# Patient Record
Sex: Female | Born: 1950
Health system: Southern US, Community
[De-identification: ages and names within clinical notes are randomized; demographics above are authoritative.]

## PROBLEM LIST (undated history)

## (undated) DIAGNOSIS — M1990 Unspecified osteoarthritis, unspecified site: Secondary | ICD-10-CM

## (undated) DIAGNOSIS — I10 Essential (primary) hypertension: Secondary | ICD-10-CM

## (undated) HISTORY — DX: Unspecified osteoarthritis, unspecified site: M19.90

## (undated) HISTORY — DX: Essential (primary) hypertension: I10

## (undated) HISTORY — PX: JOINT REPLACEMENT: SHX530

## (undated) HISTORY — PX: REPLACEMENT TOTAL KNEE: SUR1224

---

## 2007-12-20 ENCOUNTER — Emergency Department (HOSPITAL_COMMUNITY): Admission: EM | Admit: 2007-12-20 | Discharge: 2007-12-20 | Payer: Self-pay | Admitting: Emergency Medicine

## 2008-02-27 ENCOUNTER — Encounter: Admission: RE | Admit: 2008-02-27 | Discharge: 2008-03-26 | Payer: Self-pay | Admitting: Orthopedic Surgery

## 2008-08-16 ENCOUNTER — Emergency Department (HOSPITAL_COMMUNITY): Admission: EM | Admit: 2008-08-16 | Discharge: 2008-08-16 | Payer: Self-pay | Admitting: Emergency Medicine

## 2009-03-01 ENCOUNTER — Encounter: Admission: RE | Admit: 2009-03-01 | Discharge: 2009-03-01 | Payer: Self-pay | Admitting: Infectious Diseases

## 2009-10-28 ENCOUNTER — Encounter: Admission: RE | Admit: 2009-10-28 | Discharge: 2009-10-28 | Payer: Self-pay | Admitting: Internal Medicine

## 2010-10-04 ENCOUNTER — Other Ambulatory Visit: Payer: Self-pay | Admitting: Internal Medicine

## 2010-10-04 DIAGNOSIS — Z1231 Encounter for screening mammogram for malignant neoplasm of breast: Secondary | ICD-10-CM

## 2010-11-04 ENCOUNTER — Ambulatory Visit: Payer: Self-pay

## 2010-11-04 ENCOUNTER — Ambulatory Visit
Admission: RE | Admit: 2010-11-04 | Discharge: 2010-11-04 | Disposition: A | Discharge status: Home / Self Care | Payer: BC Managed Care – PPO | Source: Ambulatory Visit | Attending: Internal Medicine | Admitting: Internal Medicine

## 2010-11-04 DIAGNOSIS — Z1231 Encounter for screening mammogram for malignant neoplasm of breast: Secondary | ICD-10-CM

## 2011-08-30 DIAGNOSIS — M1710 Unilateral primary osteoarthritis, unspecified knee: Secondary | ICD-10-CM | POA: Insufficient documentation

## 2011-10-25 ENCOUNTER — Other Ambulatory Visit: Payer: Self-pay | Admitting: Internal Medicine

## 2011-10-25 DIAGNOSIS — Z1231 Encounter for screening mammogram for malignant neoplasm of breast: Secondary | ICD-10-CM

## 2011-10-30 ENCOUNTER — Other Ambulatory Visit: Payer: Self-pay | Admitting: Occupational Medicine

## 2011-10-30 ENCOUNTER — Ambulatory Visit: Payer: Self-pay

## 2011-10-30 DIAGNOSIS — R52 Pain, unspecified: Secondary | ICD-10-CM

## 2011-11-17 ENCOUNTER — Ambulatory Visit: Payer: BC Managed Care – PPO | Attending: Orthopedic Surgery | Admitting: Physical Therapy

## 2011-11-17 ENCOUNTER — Ambulatory Visit: Payer: BC Managed Care – PPO

## 2011-11-17 DIAGNOSIS — M25569 Pain in unspecified knee: Secondary | ICD-10-CM | POA: Insufficient documentation

## 2011-11-17 DIAGNOSIS — M25669 Stiffness of unspecified knee, not elsewhere classified: Secondary | ICD-10-CM | POA: Insufficient documentation

## 2011-11-17 DIAGNOSIS — R262 Difficulty in walking, not elsewhere classified: Secondary | ICD-10-CM | POA: Insufficient documentation

## 2011-11-17 DIAGNOSIS — IMO0001 Reserved for inherently not codable concepts without codable children: Secondary | ICD-10-CM | POA: Insufficient documentation

## 2011-11-20 ENCOUNTER — Ambulatory Visit: Payer: BC Managed Care – PPO | Admitting: Rehabilitation

## 2011-11-21 ENCOUNTER — Ambulatory Visit: Payer: BC Managed Care – PPO | Admitting: Physical Therapy

## 2011-11-23 ENCOUNTER — Ambulatory Visit: Payer: BC Managed Care – PPO | Admitting: Physical Therapy

## 2011-11-29 ENCOUNTER — Ambulatory Visit: Payer: BC Managed Care – PPO | Attending: Orthopedic Surgery

## 2011-11-29 DIAGNOSIS — M25569 Pain in unspecified knee: Secondary | ICD-10-CM | POA: Insufficient documentation

## 2011-11-29 DIAGNOSIS — M25669 Stiffness of unspecified knee, not elsewhere classified: Secondary | ICD-10-CM | POA: Insufficient documentation

## 2011-11-29 DIAGNOSIS — IMO0001 Reserved for inherently not codable concepts without codable children: Secondary | ICD-10-CM | POA: Insufficient documentation

## 2011-11-29 DIAGNOSIS — R262 Difficulty in walking, not elsewhere classified: Secondary | ICD-10-CM | POA: Insufficient documentation

## 2011-11-30 ENCOUNTER — Ambulatory Visit: Payer: BC Managed Care – PPO | Admitting: Physical Therapy

## 2011-12-04 DIAGNOSIS — Z96652 Presence of left artificial knee joint: Secondary | ICD-10-CM | POA: Insufficient documentation

## 2011-12-06 ENCOUNTER — Ambulatory Visit: Payer: BC Managed Care – PPO | Admitting: Physical Therapy

## 2011-12-07 ENCOUNTER — Ambulatory Visit: Payer: BC Managed Care – PPO | Admitting: Physical Therapy

## 2011-12-08 ENCOUNTER — Ambulatory Visit: Payer: BC Managed Care – PPO | Admitting: Rehabilitation

## 2011-12-11 ENCOUNTER — Encounter: Payer: BC Managed Care – PPO | Admitting: Physical Therapy

## 2011-12-12 ENCOUNTER — Ambulatory Visit: Payer: BC Managed Care – PPO | Admitting: Physical Therapy

## 2011-12-13 ENCOUNTER — Ambulatory Visit: Payer: BC Managed Care – PPO | Admitting: Physical Therapy

## 2011-12-15 ENCOUNTER — Ambulatory Visit: Payer: BC Managed Care – PPO

## 2011-12-18 ENCOUNTER — Ambulatory Visit: Payer: BC Managed Care – PPO | Admitting: Physical Therapy

## 2011-12-19 ENCOUNTER — Ambulatory Visit: Payer: BC Managed Care – PPO | Admitting: Physical Therapy

## 2011-12-22 ENCOUNTER — Ambulatory Visit: Payer: BC Managed Care – PPO | Admitting: Rehabilitation

## 2011-12-25 ENCOUNTER — Ambulatory Visit: Payer: BC Managed Care – PPO | Admitting: Physical Therapy

## 2011-12-28 ENCOUNTER — Ambulatory Visit: Payer: BC Managed Care – PPO | Attending: Orthopedic Surgery | Admitting: Physical Therapy

## 2011-12-28 DIAGNOSIS — M25569 Pain in unspecified knee: Secondary | ICD-10-CM | POA: Insufficient documentation

## 2011-12-28 DIAGNOSIS — IMO0001 Reserved for inherently not codable concepts without codable children: Secondary | ICD-10-CM | POA: Insufficient documentation

## 2011-12-28 DIAGNOSIS — M25669 Stiffness of unspecified knee, not elsewhere classified: Secondary | ICD-10-CM | POA: Insufficient documentation

## 2011-12-28 DIAGNOSIS — R262 Difficulty in walking, not elsewhere classified: Secondary | ICD-10-CM | POA: Insufficient documentation

## 2011-12-28 DIAGNOSIS — Z96659 Presence of unspecified artificial knee joint: Secondary | ICD-10-CM | POA: Insufficient documentation

## 2011-12-29 ENCOUNTER — Ambulatory Visit
Admission: RE | Admit: 2011-12-29 | Discharge: 2011-12-29 | Disposition: A | Discharge status: Home / Self Care | Payer: BC Managed Care – PPO | Source: Ambulatory Visit | Attending: Internal Medicine | Admitting: Internal Medicine

## 2011-12-29 DIAGNOSIS — Z1231 Encounter for screening mammogram for malignant neoplasm of breast: Secondary | ICD-10-CM

## 2012-01-01 ENCOUNTER — Encounter: Payer: BC Managed Care – PPO | Admitting: Physical Therapy

## 2012-01-02 ENCOUNTER — Ambulatory Visit: Payer: Self-pay | Admitting: Physical Therapy

## 2012-01-04 ENCOUNTER — Ambulatory Visit: Payer: BC Managed Care – PPO | Attending: Orthopedic Surgery | Admitting: Physical Therapy

## 2012-01-04 DIAGNOSIS — IMO0001 Reserved for inherently not codable concepts without codable children: Secondary | ICD-10-CM | POA: Insufficient documentation

## 2012-01-04 DIAGNOSIS — M25669 Stiffness of unspecified knee, not elsewhere classified: Secondary | ICD-10-CM | POA: Insufficient documentation

## 2012-01-04 DIAGNOSIS — R262 Difficulty in walking, not elsewhere classified: Secondary | ICD-10-CM | POA: Insufficient documentation

## 2012-01-04 DIAGNOSIS — M25569 Pain in unspecified knee: Secondary | ICD-10-CM | POA: Insufficient documentation

## 2012-01-09 ENCOUNTER — Ambulatory Visit: Payer: BC Managed Care – PPO | Admitting: Rehabilitation

## 2012-01-11 ENCOUNTER — Ambulatory Visit: Payer: BC Managed Care – PPO | Admitting: Rehabilitation

## 2012-01-16 ENCOUNTER — Encounter: Payer: Self-pay | Admitting: Physical Therapy

## 2012-01-17 ENCOUNTER — Ambulatory Visit: Payer: BC Managed Care – PPO | Admitting: Physical Therapy

## 2012-01-18 ENCOUNTER — Ambulatory Visit: Payer: BC Managed Care – PPO | Admitting: Physical Therapy

## 2012-01-23 ENCOUNTER — Ambulatory Visit: Payer: BC Managed Care – PPO | Admitting: Physical Therapy

## 2012-01-25 ENCOUNTER — Ambulatory Visit: Payer: BC Managed Care – PPO | Admitting: Physical Therapy

## 2012-01-29 ENCOUNTER — Ambulatory Visit: Payer: BC Managed Care – PPO | Attending: Orthopedic Surgery | Admitting: Physical Therapy

## 2012-01-29 DIAGNOSIS — M25669 Stiffness of unspecified knee, not elsewhere classified: Secondary | ICD-10-CM | POA: Insufficient documentation

## 2012-01-29 DIAGNOSIS — IMO0001 Reserved for inherently not codable concepts without codable children: Secondary | ICD-10-CM | POA: Insufficient documentation

## 2012-01-29 DIAGNOSIS — M25569 Pain in unspecified knee: Secondary | ICD-10-CM | POA: Insufficient documentation

## 2012-01-29 DIAGNOSIS — R262 Difficulty in walking, not elsewhere classified: Secondary | ICD-10-CM | POA: Insufficient documentation

## 2012-01-31 ENCOUNTER — Ambulatory Visit: Payer: BC Managed Care – PPO | Admitting: Physical Therapy

## 2012-02-05 ENCOUNTER — Ambulatory Visit: Payer: BC Managed Care – PPO | Admitting: Physical Therapy

## 2012-02-06 ENCOUNTER — Encounter: Payer: BC Managed Care – PPO | Admitting: Rehabilitation

## 2012-02-09 ENCOUNTER — Encounter: Payer: Self-pay | Admitting: Physical Therapy

## 2012-02-13 ENCOUNTER — Ambulatory Visit: Payer: BC Managed Care – PPO | Admitting: Physical Therapy

## 2012-02-15 ENCOUNTER — Encounter: Payer: Self-pay | Admitting: Physical Therapy

## 2012-02-20 ENCOUNTER — Ambulatory Visit: Payer: BC Managed Care – PPO | Admitting: Physical Therapy

## 2012-02-21 ENCOUNTER — Encounter: Payer: Self-pay | Admitting: Physical Therapy

## 2012-02-21 ENCOUNTER — Ambulatory Visit: Payer: BC Managed Care – PPO | Admitting: Physical Therapy

## 2012-02-27 ENCOUNTER — Ambulatory Visit: Payer: BC Managed Care – PPO | Attending: Orthopedic Surgery | Admitting: Physical Therapy

## 2012-02-27 DIAGNOSIS — M25669 Stiffness of unspecified knee, not elsewhere classified: Secondary | ICD-10-CM | POA: Insufficient documentation

## 2012-02-27 DIAGNOSIS — M25569 Pain in unspecified knee: Secondary | ICD-10-CM | POA: Insufficient documentation

## 2012-02-27 DIAGNOSIS — IMO0001 Reserved for inherently not codable concepts without codable children: Secondary | ICD-10-CM | POA: Insufficient documentation

## 2012-02-27 DIAGNOSIS — R262 Difficulty in walking, not elsewhere classified: Secondary | ICD-10-CM | POA: Insufficient documentation

## 2012-02-29 ENCOUNTER — Encounter: Payer: Self-pay | Admitting: Rehabilitation

## 2012-03-05 ENCOUNTER — Encounter: Payer: Self-pay | Admitting: Physical Therapy

## 2013-01-16 ENCOUNTER — Other Ambulatory Visit: Payer: Self-pay

## 2013-01-16 DIAGNOSIS — Z1231 Encounter for screening mammogram for malignant neoplasm of breast: Secondary | ICD-10-CM

## 2013-02-13 ENCOUNTER — Ambulatory Visit
Admission: RE | Admit: 2013-02-13 | Discharge: 2013-02-13 | Disposition: A | Discharge status: Home / Self Care | Payer: BC Managed Care – PPO | Source: Ambulatory Visit

## 2013-02-13 DIAGNOSIS — Z1231 Encounter for screening mammogram for malignant neoplasm of breast: Secondary | ICD-10-CM

## 2014-01-27 ENCOUNTER — Other Ambulatory Visit: Payer: Self-pay

## 2014-01-27 DIAGNOSIS — Z1231 Encounter for screening mammogram for malignant neoplasm of breast: Secondary | ICD-10-CM

## 2014-02-24 ENCOUNTER — Ambulatory Visit
Admission: RE | Admit: 2014-02-24 | Discharge: 2014-02-24 | Disposition: A | Discharge status: Home / Self Care | Payer: BC Managed Care – PPO | Source: Ambulatory Visit

## 2014-02-24 DIAGNOSIS — Z1231 Encounter for screening mammogram for malignant neoplasm of breast: Secondary | ICD-10-CM

## 2014-12-07 ENCOUNTER — Ambulatory Visit (INDEPENDENT_AMBULATORY_CARE_PROVIDER_SITE_OTHER): Payer: BLUE CROSS/BLUE SHIELD | Admitting: Family Medicine

## 2014-12-07 VITALS — BP 124/80 | HR 87 | Temp 98.3°F | Resp 18 | Ht <= 58 in | Wt 134.1 lb

## 2014-12-07 DIAGNOSIS — J0100 Acute maxillary sinusitis, unspecified: Secondary | ICD-10-CM | POA: Diagnosis not present

## 2014-12-07 DIAGNOSIS — J029 Acute pharyngitis, unspecified: Secondary | ICD-10-CM | POA: Diagnosis not present

## 2014-12-07 MED ORDER — AMOXICILLIN 500 MG PO CAPS: 500.0000 mg | ORAL_CAPSULE | Freq: Two times a day (BID) | ORAL | Status: DC

## 2014-12-07 NOTE — Patient Instructions (Signed)
Please let us know if you're not improving after the next 2-3 days.

## 2014-12-07 NOTE — Progress Notes (Signed)
This chart was scribed for Robyn Haber, MD by Moises Blood, medical scribe at [facility].The patient was seen in exam room 12 and the patient's care was started at 8:17 PM.  Patient ID: April Randall MRN: [id number], DOB: June 12, 1950, 64 y.o. Date of Encounter: 12/07/2014  Primary Physician: No primary care provider on file.  Chief Complaint:  Chief Complaint  Patient presents with   Headache    x 3 days   Sore Throat    x 3 days   Chills    happens around 4pm daily    HPI:  April Randall is a 64 y.o. female who presents to [facility] complaining of headache with associated chills and sore throat for the past 3 days. She would feel cold and have to get covers to stay warm. She feels feverish at night with some coughs. She also has some lost taste from food, and hurts to even swallow saliva. She denies nausea, abdominal pain, UTI symptoms, sneezing.   She has grandchildren at home.   Past Medical History  Diagnosis Date   Arthritis    Hypertension      Home Meds: Prior to Admission medications   Medication Sig Start Date End Date Taking? Authorizing Provider  amLODipine (NORVASC) 10 MG tablet Take 10 mg by mouth daily.   Yes Historical Provider, MD  hydrochlorothiazide (HYDRODIURIL) 25 MG tablet Take 25 mg by mouth daily.   Yes Historical Provider, MD  lisinopril (PRINIVIL,ZESTRIL) 20 MG tablet Take 20 mg by mouth daily.   Yes Historical Provider, MD    Allergies: No Known Allergies  Social History   Social History   Marital Status: Married    Spouse Name: N/A   Number of Children: N/A   Years of Education: N/A   Occupational History   Not on file.   Social History Main Topics   Smoking status: Never Smoker    Smokeless tobacco: Not on file   Alcohol Use: No   Drug Use: No   Sexual Activity: Not on file   Other Topics Concern   Not on file   Social History Narrative   No narrative on file      Review of Systems: Constitutional: negative for weight changes; positive for chills, fever, night sweats, fatigue HEENT: negative for vision changes, hearing loss, congestion, rhinorrhea, ST, epistaxis, or sinus pressure Cardiovascular: negative for chest pain or palpitations Respiratory: negative for hemoptysis, wheezing, shortness of breath; positive for cough Abdominal: negative for abdominal pain, nausea, vomiting, diarrhea, or constipation Dermatological: negative for rash Neurologic: negative for dizziness, or syncope; positive for headache GU: negative for UTI symptoms All other systems reviewed and are otherwise negative with the exception to those above and in the HPI.  Physical Exam: Blood pressure 124/80, pulse 87, temperature 98.3 F (36.8 C), temperature source Oral, resp. rate 18, height 4' 9.25" (1.454 m), weight 134 lb 2 oz (60.839 kg), SpO2 98 %., Body mass index is 28.78 kg/(m^2). General: Well developed, well nourished, in no acute distress. Head: Normocephalic, atraumatic, eyes without discharge, sclera non-icteric, nares are without discharge. Bilateral auditory canals clear, TM's are without perforation, pearly grey and translucent with reflective cone of light bilaterally. Oral cavity moist, posterior pharynx without exudate, but with some erythema and post nasal drip. Both nasal passages are swollen with exudates Neck: Supple. No thyromegaly. Full ROM. No lymphadenopathy. Lungs: Clear bilaterally to auscultation without wheezes, rales, or rhonchi. Breathing is unlabored. Heart: RRR with  S1 S2. No murmurs, rubs, or gallops appreciated. Abdomen: Soft, non-tender, non-distended with normoactive bowel sounds. No hepatomegaly. No rebound/guarding. No obvious abdominal masses. Msk:  Strength and tone normal for age. Extremities/Skin: Warm and dry. No clubbing or cyanosis. No edema. No rashes or suspicious lesions. Neuro: Alert and oriented X 3. Moves all extremities  spontaneously. Gait is normal. CNII-XII grossly in tact. Psych:  Responds to questions appropriately with a normal affect.   Labs:  ASSESSMENT AND PLAN:  64 y.o. year old female with  This chart was scribed in my presence and reviewed by me personally.    ICD-9-CM ICD-10-CM   1. Acute maxillary sinusitis, recurrence not specified 461.0 J01.00 amoxicillin (AMOXIL) 500 MG capsule  2. Acute pharyngitis, unspecified pharyngitis type 462 J02.9 amoxicillin (AMOXIL) 500 MG capsule     Signed, Robyn Haber, MD    Signed, Robyn Haber, MD 12/07/2014 8:17 PM

## 2015-01-20 ENCOUNTER — Other Ambulatory Visit: Payer: Self-pay

## 2015-01-20 DIAGNOSIS — Z1231 Encounter for screening mammogram for malignant neoplasm of breast: Secondary | ICD-10-CM

## 2015-01-30 ENCOUNTER — Other Ambulatory Visit: Payer: Self-pay | Admitting: Internal Medicine

## 2015-01-30 DIAGNOSIS — E2839 Other primary ovarian failure: Secondary | ICD-10-CM

## 2015-02-26 ENCOUNTER — Ambulatory Visit
Admission: RE | Admit: 2015-02-26 | Discharge: 2015-02-26 | Disposition: A | Discharge status: Home / Self Care | Payer: BLUE CROSS/BLUE SHIELD | Source: Ambulatory Visit

## 2015-02-26 DIAGNOSIS — Z1231 Encounter for screening mammogram for malignant neoplasm of breast: Secondary | ICD-10-CM

## 2015-03-26 ENCOUNTER — Observation Stay (HOSPITAL_COMMUNITY)
Admission: EM | Admit: 2015-03-26 | Discharge: 2015-03-27 | Disposition: A | Discharge status: Home / Self Care | Payer: BLUE CROSS/BLUE SHIELD | Attending: Internal Medicine | Admitting: Internal Medicine

## 2015-03-26 ENCOUNTER — Emergency Department (HOSPITAL_COMMUNITY): Payer: BLUE CROSS/BLUE SHIELD

## 2015-03-26 ENCOUNTER — Encounter (HOSPITAL_COMMUNITY): Payer: Self-pay | Admitting: Emergency Medicine

## 2015-03-26 ENCOUNTER — Observation Stay (HOSPITAL_COMMUNITY): Payer: BLUE CROSS/BLUE SHIELD

## 2015-03-26 DIAGNOSIS — E785 Hyperlipidemia, unspecified: Secondary | ICD-10-CM | POA: Diagnosis not present

## 2015-03-26 DIAGNOSIS — I10 Essential (primary) hypertension: Secondary | ICD-10-CM | POA: Diagnosis not present

## 2015-03-26 DIAGNOSIS — E876 Hypokalemia: Secondary | ICD-10-CM | POA: Diagnosis not present

## 2015-03-26 DIAGNOSIS — G454 Transient global amnesia: Secondary | ICD-10-CM | POA: Diagnosis not present

## 2015-03-26 DIAGNOSIS — Z79899 Other long term (current) drug therapy: Secondary | ICD-10-CM | POA: Diagnosis not present

## 2015-03-26 DIAGNOSIS — M1990 Unspecified osteoarthritis, unspecified site: Secondary | ICD-10-CM | POA: Diagnosis present

## 2015-03-26 DIAGNOSIS — G9340 Encephalopathy, unspecified: Secondary | ICD-10-CM | POA: Diagnosis present

## 2015-03-26 LAB — I-STAT VENOUS BLOOD GAS, ED
Acid-Base Excess: 1 mmol/L (ref 0.0–2.0)
Bicarbonate: 27.1 mEq/L — ABNORMAL HIGH (ref 20.0–24.0)
O2 Saturation: 79 %
PH VEN: 7.382 — AB (ref 7.250–7.300)
TCO2: 28 mmol/L (ref 0–100)
pCO2, Ven: 45.6 mmHg (ref 45.0–50.0)
pO2, Ven: 44 mmHg (ref 30.0–45.0)

## 2015-03-26 LAB — CBC
HEMATOCRIT: 42.1 % (ref 36.0–46.0)
Hemoglobin: 14.1 g/dL (ref 12.0–15.0)
MCH: 30.1 pg (ref 26.0–34.0)
MCHC: 33.5 g/dL (ref 30.0–36.0)
MCV: 89.8 fL (ref 78.0–100.0)
Platelets: 204 10*3/uL (ref 150–400)
RBC: 4.69 MIL/uL (ref 3.87–5.11)
RDW: 12.7 % (ref 11.5–15.5)
WBC: 4.8 10*3/uL (ref 4.0–10.5)

## 2015-03-26 LAB — URINALYSIS, ROUTINE W REFLEX MICROSCOPIC
BILIRUBIN URINE: NEGATIVE
Glucose, UA: NEGATIVE mg/dL
Hgb urine dipstick: NEGATIVE
Ketones, ur: 15 mg/dL — AB
Leukocytes, UA: NEGATIVE
NITRITE: NEGATIVE
PH: 7 (ref 5.0–8.0)
Protein, ur: NEGATIVE mg/dL
SPECIFIC GRAVITY, URINE: 1.009 (ref 1.005–1.030)

## 2015-03-26 LAB — DIFFERENTIAL
BASOS ABS: 0 10*3/uL (ref 0.0–0.1)
Basophils Relative: 0 %
EOS PCT: 1 %
Eosinophils Absolute: 0 10*3/uL (ref 0.0–0.7)
LYMPHS ABS: 1.2 10*3/uL (ref 0.7–4.0)
LYMPHS PCT: 26 %
MONO ABS: 0.3 10*3/uL (ref 0.1–1.0)
Monocytes Relative: 5 %
NEUTROS ABS: 3.2 10*3/uL (ref 1.7–7.7)
NEUTROS PCT: 68 %

## 2015-03-26 LAB — I-STAT CHEM 8, ED
BUN: 18 mg/dL (ref 6–20)
CALCIUM ION: 1.4 mmol/L — AB (ref 1.13–1.30)
CHLORIDE: 99 mmol/L — AB (ref 101–111)
Creatinine, Ser: 0.9 mg/dL (ref 0.44–1.00)
GLUCOSE: 122 mg/dL — AB (ref 65–99)
HCT: 46 % (ref 36.0–46.0)
HEMOGLOBIN: 15.6 g/dL — AB (ref 12.0–15.0)
Potassium: 2.9 mmol/L — ABNORMAL LOW (ref 3.5–5.1)
Sodium: 139 mmol/L (ref 135–145)
TCO2: 27 mmol/L (ref 0–100)

## 2015-03-26 LAB — COMPREHENSIVE METABOLIC PANEL
ALK PHOS: 90 U/L (ref 38–126)
ALT: 21 U/L (ref 14–54)
ANION GAP: 10 (ref 5–15)
AST: 28 U/L (ref 15–41)
Albumin: 4.1 g/dL (ref 3.5–5.0)
BILIRUBIN TOTAL: 0.8 mg/dL (ref 0.3–1.2)
BUN: 13 mg/dL (ref 6–20)
CALCIUM: 11.2 mg/dL — AB (ref 8.9–10.3)
CO2: 26 mmol/L (ref 22–32)
Chloride: 102 mmol/L (ref 101–111)
Creatinine, Ser: 0.87 mg/dL (ref 0.44–1.00)
GFR calc non Af Amer: >60 mL/min (ref >60)
Glucose, Bld: 127 mg/dL — ABNORMAL HIGH (ref 65–99)
Potassium: 3 mmol/L — ABNORMAL LOW (ref 3.5–5.1)
Sodium: 138 mmol/L (ref 135–145)
TOTAL PROTEIN: 7.2 g/dL (ref 6.5–8.1)

## 2015-03-26 LAB — CBG MONITORING, ED: GLUCOSE-CAPILLARY: 128 mg/dL — AB (ref 65–99)

## 2015-03-26 LAB — RAPID URINE DRUG SCREEN, HOSP PERFORMED
AMPHETAMINES: NOT DETECTED
BENZODIAZEPINES: NOT DETECTED
Barbiturates: NOT DETECTED
Cocaine: NOT DETECTED
OPIATES: NOT DETECTED
Tetrahydrocannabinol: NOT DETECTED

## 2015-03-26 LAB — I-STAT TROPONIN, ED: Troponin i, poc: 0.02 ng/mL (ref 0.00–0.08)

## 2015-03-26 LAB — PROTIME-INR
INR: 1.05 (ref 0.00–1.49)
Prothrombin Time: 13.9 seconds (ref 11.6–15.2)

## 2015-03-26 LAB — APTT: APTT: 29 s (ref 24–37)

## 2015-03-26 MED ORDER — ASPIRIN 325 MG PO TABS
325.0000 mg | ORAL_TABLET | Freq: Every day | ORAL | Status: DC
  Administered 2015-03-27: 325 mg via ORAL
  Filled 2015-03-26: qty 1

## 2015-03-26 MED ORDER — POTASSIUM CHLORIDE CRYS ER 20 MEQ PO TBCR
40.0000 meq | EXTENDED_RELEASE_TABLET | Freq: Once | ORAL | Status: AC
  Administered 2015-03-26: 40 meq via ORAL
  Filled 2015-03-26: qty 2

## 2015-03-26 MED ORDER — HYDRALAZINE HCL 20 MG/ML IJ SOLN: 5.0000 mg | INTRAMUSCULAR | Status: DC | PRN

## 2015-03-26 MED ORDER — AMLODIPINE BESYLATE 10 MG PO TABS
10.0000 mg | ORAL_TABLET | Freq: Every day | ORAL | Status: DC
  Administered 2015-03-27: 10 mg via ORAL
  Filled 2015-03-26: qty 1

## 2015-03-26 MED ORDER — STROKE: EARLY STAGES OF RECOVERY BOOK
Freq: Once | Status: AC
Start: 2015-03-26 — End: 2015-03-27
  Administered 2015-03-27: 01:00:00
  Filled 2015-03-26: qty 1

## 2015-03-26 MED ORDER — ASPIRIN 300 MG RE SUPP: 300.0000 mg | Freq: Every day | RECTAL | Status: DC

## 2015-03-26 MED ORDER — ENOXAPARIN SODIUM 40 MG/0.4ML ~~LOC~~ SOLN: 40.0000 mg | SUBCUTANEOUS | Status: DC

## 2015-03-26 MED ORDER — SENNOSIDES-DOCUSATE SODIUM 8.6-50 MG PO TABS: 1.0000 | ORAL_TABLET | Freq: Every evening | ORAL | Status: DC | PRN

## 2015-03-26 MED ORDER — LISINOPRIL 20 MG PO TABS
20.0000 mg | ORAL_TABLET | Freq: Every day | ORAL | Status: DC
  Administered 2015-03-27: 20 mg via ORAL
  Filled 2015-03-26: qty 1

## 2015-03-26 NOTE — ED Notes (Signed)
The pt lost her memory one hour ago  She was at work and her daughter was called  She knows her name but cannot answer any other questions except she thinks she is in a hospital

## 2015-03-26 NOTE — Progress Notes (Signed)
Received report from E.D  Was informed patient at MRI at this time.

## 2015-03-26 NOTE — ED Provider Notes (Signed)
CSN: [id number]     Arrival date & time 03/26/15  2128 History   First MD Initiated Contact with Patient 03/26/15 2153     Chief Complaint  Patient presents with  . Cerebrovascular Accident    An emergency department physician performed an initial assessment on this suspected stroke patient at 2141. (Consider location/radiation/quality/duration/timing/severity/associated sxs/prior Treatment) HPI Comments: April Randall is a 64 y.o. female who was at work earlier when she began developing difficulty with her memory. She was transported to the emergency department [place] was called. She has difficulty telling us the month, where she is, or what is going on. She is not sure what she was doing at work, but does remember being at work.  Besides her memory problems, she denies any numbness, tingling, chest pain, headache, weakness. No seizure hx.  ROS 10 Systems reviewed and are negative for acute change except as noted in the HPI.   The history is provided by the patient.    Past Medical History  Diagnosis Date  . Arthritis   . Hypertension    Past Surgical History  Procedure Laterality Date  . Replacement total knee Right    History reviewed. No pertinent family history. Social History  Substance Use Topics  . Smoking status: Never Smoker   . Smokeless tobacco: None  . Alcohol Use: No   OB History    No data available     Review of Systems    Allergies  Review of patient's allergies indicates no known allergies.  Home Medications   Prior to Admission medications   Medication Sig Start Date End Date Taking? Authorizing Provider  amLODipine (NORVASC) 10 MG tablet Take 10 mg by mouth daily.   Yes Historical Provider, MD  hydrochlorothiazide (HYDRODIURIL) 25 MG tablet Take 25 mg by mouth daily.   Yes Historical Provider, MD  lisinopril (PRINIVIL,ZESTRIL) 20 MG tablet Take 20 mg by mouth daily.   Yes Historical Provider, MD  amoxicillin (AMOXIL) 500 MG capsule Take  1 capsule (500 mg total) by mouth 2 (two) times daily. Patient not taking: Reported on 03/26/2015 12/07/14   Robyn Haber, MD   BP 118/85 mmHg  Pulse 85  Temp(Src) 98 F (36.7 C) (Oral)  Resp 17  Ht 4\' 9"  (1.448 m)  Wt 136 lb 14.5 oz (62.1 kg)  BMI 29.62 kg/m2  SpO2 99% Physical Exam  Constitutional: She appears well-developed.  HENT:  Head: Normocephalic and atraumatic.  Eyes: EOM are normal.  Neck: Normal range of motion. Neck supple.  Cardiovascular: Normal rate.   Pulmonary/Chest: Effort normal.  Abdominal: Bowel sounds are normal.  Neurological: She is alert. No cranial nerve deficit. Coordination normal.  Skin: Skin is warm and dry.  Nursing note and vitals reviewed.   ED Course  Procedures (including critical care time) Labs Review Labs Reviewed  CBG MONITORING, ED - Abnormal; Notable for the following:    Glucose-Capillary 128 (*)    All other components within normal limits  I-STAT CHEM 8, ED - Abnormal; Notable for the following:    Potassium 2.9 (*)    Chloride 99 (*)    Glucose, Bld 122 (*)    Calcium, Ion 1.40 (*)    Hemoglobin 15.6 (*)    All other components within normal limits  I-STAT VENOUS BLOOD GAS, ED - Abnormal; Notable for the following:    pH, Ven 7.382 (*)    Bicarbonate 27.1 (*)    All other components within normal limits  PROTIME-INR  APTT  CBC  DIFFERENTIAL  COMPREHENSIVE METABOLIC PANEL  I-STAT TROPOININ, ED    Imaging Review Ct Head Wo Contrast  03/26/2015  CLINICAL DATA:  Code stroke.  Memory loss.  Initial encounter. EXAM: CT HEAD WITHOUT CONTRAST TECHNIQUE: Contiguous axial images were obtained from the base of the skull through the vertex without intravenous contrast. COMPARISON:  None. FINDINGS: There is no evidence of acute infarction, mass lesion, or intra- or extra-axial hemorrhage on CT. Scattered chronic lacunar infarcts are seen at the basal ganglia bilaterally. Mild periventricular white matter change likely reflects  small vessel ischemic microangiopathy. Slightly decreased attenuation at the inferior temporal lobes is thought to be artifactual in nature. The posterior fossa, including the cerebellum, brainstem and fourth ventricle, is within normal limits. The third and lateral ventricles are unremarkable in appearance. The cerebral hemispheres demonstrate grossly normal gray-white differentiation. No mass effect or midline shift is seen. There is no evidence of fracture; visualized osseous structures are unremarkable in appearance. The visualized portions of the orbits are within normal limits. There is partial opacification of the left maxillary sinus. The remaining paranasal sinuses and mastoid air cells are well-aerated. No significant soft tissue abnormalities are seen. IMPRESSION: 1. No acute intracranial pathology seen on CT. 2. Scattered chronic lacunar infarcts at the basal ganglia bilaterally. 3. Mild small vessel ischemic microangiopathy. 4. Partial opacification of the left maxillary sinus. These results were called by telephone at the time of interpretation on 03/26/2015 at 9:53 pm to Dr. Leonel Ramsay, who verbally acknowledged these results. Electronically Signed   By: Garald Balding M.D.   On: 03/26/2015 21:55   I have personally reviewed and evaluated these images and lab results as part of my medical decision-making.   EKG Interpretation None      MDM   Final diagnoses:  Transient global amnesia    Pt comes in with cc of confusion. She has no focal neuro deficits. It appears that pt is having global amnesia. She is still symptomatic, and so we will admit. Neuro had come in to see pt immediately, as pt was code stroke - and they would appreciated EEG and MRI.    Varney Biles, MD 03/27/15 [phone]

## 2015-03-26 NOTE — Consult Note (Signed)
Neurology Consultation Reason for Consult: Memory loss Referring Physician: Mora Bellman  CC: Memory loss  History is obtained from: Patient, daughter  HPI: April Randall is a 64 y.o. female who was at work earlier when she began developing difficulty with her memory. She was transported to the emergency department workup stroke was called. She has difficulty telling us the month, where she is, or what is going on. She is not sure what she was doing at work, but does remember being at work. She knows who her daughter is. She initially states that she does not know what her name is. Besides her memory problems, she has no other symptoms including headache, clouded sensorium, fevers, chills, or other problems. She still is having some difficulty with her memory at this time.  Of note, her daughter reports that there is a similar episode approximately one year ago.   LKW: Unclear tpa given?: no, mild symptoms, not clearly stroke    ROS: A 14 point ROS was performed and is negative except as noted in the HPI.   Past Medical History  Diagnosis Date  . Arthritis   . Hypertension      Family History  Problem Relation Age of Onset  . Hypertension Mother      Social History:  reports that she has never smoked. She does not have any smokeless tobacco history on file. She reports that she does not drink alcohol or use illicit drugs.   Exam: Current vital signs: BP 142/92 mmHg  Pulse 82  Temp(Src) 98 F (36.7 C) (Oral)  Resp 20  Ht 4\' 9"  (1.448 m)  Wt 62.1 kg (136 lb 14.5 oz)  BMI 29.62 kg/m2  SpO2 100% Vital signs in last 24 hours: Temp:  [98 F (36.7 C)] 98 F (36.7 C) (12/30 2133) Pulse Rate:  [82-91] 82 (12/30 2230) Resp:  [16-20] 20 (12/30 2230) BP: (118-158)/(85-92) 142/92 mmHg (12/30 2230) SpO2:  [97 %-100 %] 100 % (12/30 2230) Weight:  [62.1 kg (136 lb 14.5 oz)] 62.1 kg (136 lb 14.5 oz) (12/30 2149)   Physical Exam  Constitutional: Appears well-developed and  well-nourished.  Psych: Affect appropriate to situation Eyes: No scleral injection HENT: No OP obstrucion Head: Normocephalic.  Cardiovascular: Normal rate and regular rhythm.  Respiratory: Effort normal and breath sounds normal to anterior ascultation GI: Soft.  No distension. There is no tenderness.  Skin: WDI  Neuro: Mental Status: Patient is awake, alert, unable to give month or location. She has the number of quarters in $2.75 as 7. She is able to easily add 5+7. She is able to repeat without difficulty. Cranial Nerves: II: Visual Fields are full. Pupils are equal, round, and reactive to light.   III,IV, VI: EOMI without ptosis or diploplia.  V: Facial sensation is symmetric to temperature VII: Facial movement is symmetric.  VIII: hearing is intact to voice X: Uvula elevates symmetrically XI: Shoulder shrug is symmetric. XII: tongue is midline without atrophy or fasciculations.  Motor: Tone is normal. Bulk is normal. 5/5 strength was present in all four extremities.  Sensory: Sensation is symmetric to light touch and temperature in the arms and legs. Cerebellar: FNF and HKS are intact bilaterally         I have reviewed labs in epic and the results pertinent to this consultation are: Mildly Elevated calcium  I have reviewed the images obtained: CT head-negative  Impression: 64 year old female with memory difficulties. It is not clear that she had any other symptoms at  any point during this episode. Her presentation is not classical for transient global amnesia, but he thinks that this is one consideration. It is rarely recurrent, but can happen. Seizure would also be something that would need to be considered, though I don't have any clear evidence that this is what caused this episode. Psychogenic fugue would be diagnosis of exclusion. Given that she is not back to baseline, I would favor admission for observation and getting an MRI/EEG.  Recommendations: 1) MRI 2)  EEG 3) neurology will follow   Roland Rack, MD [facility] [phone]  If 7pm- 7am, please page neurology on call as listed in [place].

## 2015-03-26 NOTE — Code Documentation (Signed)
April Randall is a 64yo bf presenting to the [facility] for acute onset confusion while at work.  She was brought to the ED by her daughter.  She was at work when she became confused and her employer called her daughter to come get her.  The pt remembers being at work but is unable to recall the details of what she was doing or the details of her day.  She is able to recognize her daughter and knows her own name but cannot recall the month, year, or how old she is.  NIH 2 for questions incorrect otherwise exam in nonfocal.

## 2015-03-26 NOTE — H&P (Addendum)
[facility] History and Physical  April Randall [id number] DOB: 1950/09/28 DOA: 03/26/2015  Referring physician: ED physician PCP: No primary care provider on file.  Specialists:   Chief Complaint: Confusion and loss of memory  HPI: April Randall is a 64 y.o. female with PMH of hypertension, arthritis, who presents with confusion and loss of memory.  Per report, pt became confused at work at about 8:00PM. She is able to recognize her daughter and knows her own name, but cannot recall the month, year, or how old she is.She did not pass out. The episode lasted for about 20 minutes. She denies unilateral weakness, chest pain, palpitation, shortness of breath. No abdominal pain, diarrhea, symptoms of UTI, rashes, fever or chills. Currently she looks at her normal health condition per her daughter. No complaints. Daughter states that the patient had a similar episode last year. She had negative CT scan of her head per her daughter.  In ED, patient was found to have negative CT head, INR 1.05, PTT 29, WBC 4.8, temperature normal, no tachycardia, potassium 2.9, renal function normal. Patient's admitted to inpatient for further evaluation and treatment. Neurology was consulted.  Where does patient live?   At home  Can patient participate in ADLs?  Yes  Review of Systems:   General: no fevers, chills, no changes in body weight, has fatigue HEENT: no blurry vision, hearing changes or sore throat Pulm: no dyspnea, coughing, wheezing CV: no chest pain, palpitations Abd: no nausea, vomiting, abdominal pain, diarrhea, constipation GU: no dysuria, burning on urination, increased urinary frequency, hematuria  Ext: no leg edema Neuro: no unilateral weakness, numbness, or tingling, no vision change or hearing loss. Had confusion and loss of memory. Skin: no rash MSK: No muscle spasm, no deformity, no limitation of range of movement in spin Heme: No easy bruising.  Travel history: No recent  long distant travel.  Allergy: No Known Allergies  Past Medical History  Diagnosis Date  . Arthritis   . Hypertension     Past Surgical History  Procedure Laterality Date  . Replacement total knee Right     Social History:  reports that she has never smoked. She does not have any smokeless tobacco history on file. She reports that she does not drink alcohol or use illicit drugs.  Family History:  Family History  Problem Relation Age of Onset  . Hypertension Mother      Prior to Admission medications   Medication Sig Start Date End Date Taking? Authorizing Provider  amLODipine (NORVASC) 10 MG tablet Take 10 mg by mouth daily.   Yes Historical Provider, MD  hydrochlorothiazide (HYDRODIURIL) 25 MG tablet Take 25 mg by mouth daily.   Yes Historical Provider, MD  lisinopril (PRINIVIL,ZESTRIL) 20 MG tablet Take 20 mg by mouth daily.   Yes Historical Provider, MD  amoxicillin (AMOXIL) 500 MG capsule Take 1 capsule (500 mg total) by mouth 2 (two) times daily. Patient not taking: Reported on 03/26/2015 12/07/14   Robyn Haber, MD    Physical Exam: Filed Vitals:   03/26/15 2133 03/26/15 2149 03/26/15 2200  BP: 158/89  118/85  Pulse: 91  85  Temp: 98 F (36.7 C)    TempSrc: Oral    Resp: 16  17  Height:  4\' 9"  (1.448 m)   Weight:  62.1 kg (136 lb 14.5 oz)   SpO2: 97%  99%   General: Not in acute distress HEENT:       Eyes: PERRL, EOMI, no scleral icterus.  ENT: No discharge from the ears and nose, no pharynx injection, no tonsillar enlargement.        Neck: No JVD, no bruit, no mass felt. Heme: No neck lymph node enlargement. Cardiac: S1/S2, RRR, No murmurs, No gallops or rubs. Pulm: Good air movement bilaterally. No rales, wheezing, rhonchi or rubs. Abd: Soft, nondistended, nontender, no rebound pain, no organomegaly, BS present. Ext: No pitting leg edema bilaterally. 2+DP/PT pulse bilaterally. Musculoskeletal: No joint deformities, No joint redness or warmth, no  limitation of ROM in spin. Skin: No rashes.  Neuro: Alert, oriented X3, cranial nerves II-XII grossly intact, muscle strength 5/5 in all extremities, sensation to light touch intact. Negative Babinski's sign. Normal finger to nose test. Psych: Patient is not psychotic, no suicidal or hemocidal ideation.  Labs on Admission:  Basic Metabolic Panel:  Recent Labs Lab 03/26/15 2142 03/26/15 2153  NA 138 139  K 3.0* 2.9*  CL 102 99*  CO2 26  --   GLUCOSE 127* 122*  BUN 13 18  CREATININE 0.87 0.90  CALCIUM 11.2*  --    Liver Function Tests:  Recent Labs Lab 03/26/15 2142  AST 28  ALT 21  ALKPHOS 90  BILITOT 0.8  PROT 7.2  ALBUMIN 4.1   No results for input(s): LIPASE, AMYLASE in the last 168 hours. No results for input(s): AMMONIA in the last 168 hours. CBC:  Recent Labs Lab 03/26/15 2142 03/26/15 2153  WBC 4.8  --   NEUTROABS 3.2  --   HGB 14.1 15.6*  HCT 42.1 46.0  MCV 89.8  --   PLT 204  --    Cardiac Enzymes: No results for input(s): CKTOTAL, CKMB, CKMBINDEX, TROPONINI in the last 168 hours.  BNP (last 3 results) No results for input(s): BNP in the last 8760 hours.  ProBNP (last 3 results) No results for input(s): PROBNP in the last 8760 hours.  CBG:  Recent Labs Lab 03/26/15 2156  GLUCAP 128*    Radiological Exams on Admission: Ct Head Wo Contrast  03/26/2015  CLINICAL DATA:  Code stroke.  Memory loss.  Initial encounter. EXAM: CT HEAD WITHOUT CONTRAST TECHNIQUE: Contiguous axial images were obtained from the base of the skull through the vertex without intravenous contrast. COMPARISON:  None. FINDINGS: There is no evidence of acute infarction, mass lesion, or intra- or extra-axial hemorrhage on CT. Scattered chronic lacunar infarcts are seen at the basal ganglia bilaterally. Mild periventricular white matter change likely reflects small vessel ischemic microangiopathy. Slightly decreased attenuation at the inferior temporal lobes is thought to be  artifactual in nature. The posterior fossa, including the cerebellum, brainstem and fourth ventricle, is within normal limits. The third and lateral ventricles are unremarkable in appearance. The cerebral hemispheres demonstrate grossly normal gray-white differentiation. No mass effect or midline shift is seen. There is no evidence of fracture; visualized osseous structures are unremarkable in appearance. The visualized portions of the orbits are within normal limits. There is partial opacification of the left maxillary sinus. The remaining paranasal sinuses and mastoid air cells are well-aerated. No significant soft tissue abnormalities are seen. IMPRESSION: 1. No acute intracranial pathology seen on CT. 2. Scattered chronic lacunar infarcts at the basal ganglia bilaterally. 3. Mild small vessel ischemic microangiopathy. 4. Partial opacification of the left maxillary sinus. These results were called by telephone at the time of interpretation on 03/26/2015 at 9:53 pm to Dr. Leonel Ramsay, who verbally acknowledged these results. Electronically Signed   By: Garald Balding M.D.   On: 03/26/2015  21:55    EKG: Independently reviewed.  Abnormal findings: QTC 449, BAE.  Assessment/Plan Principal Problem:   Transient global amnesia Active Problems:   Arthritis   Hypertension   Acute encephalopathy   Hypokalemia  Transient global amnesia: Etiology is not clear. Neurology was consulted. Dr. Leonel Ramsay evaluated patient. Per Dr. Leonel Ramsay, differential diagnoses include transient global amnesia though not very typical presentation and seizure.  -will admit to tele bed -start ASA -check UDS, HIV ab and UA -Appreciated neurology's consultations, will follow-up recommendations as follows:  1) MRI  2) EEG  3) neurology will follow  HTN: - hold HCTZ while pt is on NPO - Continue amlodipine and lisinopril - IV hydralazine when necessary  Hypokalemia: K= 2.9 on admission. - Repleted - Check Mg  level   DVT ppx: SQ Heparin   Code Status: Full code Family Communication:  Yes, patient's dughter  at bed side Disposition Plan: Admit to inpatient   Date of Service 03/26/2015    Ivor Costa [facility] Pager [phone]  If 7PM-7AM, please contact night-coverage [email or web address] Password [email or web address] 03/26/2015, 10:48 PM

## 2015-03-26 NOTE — ED Notes (Signed)
Attempted report, per [facility] manager hasn't approved bed so unable to take report? Will call back.

## 2015-03-26 NOTE — ED Notes (Signed)
CBG 128 

## 2015-03-27 ENCOUNTER — Encounter (HOSPITAL_COMMUNITY): Payer: Self-pay | Admitting: *Deleted

## 2015-03-27 ENCOUNTER — Observation Stay (HOSPITAL_COMMUNITY)
Admit: 2015-03-27 | Discharge: 2015-03-27 | Disposition: A | Discharge status: Home / Self Care | Payer: BLUE CROSS/BLUE SHIELD | Attending: Internal Medicine | Admitting: Internal Medicine

## 2015-03-27 DIAGNOSIS — M1990 Unspecified osteoarthritis, unspecified site: Secondary | ICD-10-CM

## 2015-03-27 DIAGNOSIS — R4182 Altered mental status, unspecified: Secondary | ICD-10-CM | POA: Diagnosis not present

## 2015-03-27 DIAGNOSIS — R413 Other amnesia: Secondary | ICD-10-CM | POA: Diagnosis not present

## 2015-03-27 DIAGNOSIS — I10 Essential (primary) hypertension: Secondary | ICD-10-CM | POA: Diagnosis not present

## 2015-03-27 DIAGNOSIS — G9340 Encephalopathy, unspecified: Secondary | ICD-10-CM | POA: Diagnosis not present

## 2015-03-27 DIAGNOSIS — G454 Transient global amnesia: Secondary | ICD-10-CM | POA: Diagnosis not present

## 2015-03-27 LAB — BASIC METABOLIC PANEL
Anion gap: 8 (ref 5–15)
BUN: 12 mg/dL (ref 6–20)
CHLORIDE: 107 mmol/L (ref 101–111)
CO2: 26 mmol/L (ref 22–32)
CREATININE: 0.83 mg/dL (ref 0.44–1.00)
Calcium: 11.2 mg/dL — ABNORMAL HIGH (ref 8.9–10.3)
GFR calc Af Amer: >60 mL/min (ref >60)
GFR calc non Af Amer: >60 mL/min (ref >60)
Glucose, Bld: 103 mg/dL — ABNORMAL HIGH (ref 65–99)
POTASSIUM: 3.4 mmol/L — AB (ref 3.5–5.1)
SODIUM: 141 mmol/L (ref 135–145)

## 2015-03-27 LAB — LIPID PANEL
CHOLESTEROL: 201 mg/dL — AB (ref 0–200)
HDL: 68 mg/dL (ref >40)
LDL Cholesterol: 126 mg/dL — ABNORMAL HIGH (ref 0–99)
TRIGLYCERIDES: 36 mg/dL (ref <150)
Total CHOL/HDL Ratio: 3 RATIO
VLDL: 7 mg/dL (ref 0–40)

## 2015-03-27 LAB — HIV ANTIBODY (ROUTINE TESTING W REFLEX): HIV SCREEN 4TH GENERATION: NONREACTIVE

## 2015-03-27 LAB — MAGNESIUM: MAGNESIUM: 2.2 mg/dL (ref 1.7–2.4)

## 2015-03-27 MED ORDER — SIMVASTATIN 20 MG PO TABS: 20.0000 mg | ORAL_TABLET | Freq: Every day | ORAL | Status: DC

## 2015-03-27 MED ORDER — POTASSIUM CHLORIDE CRYS ER 20 MEQ PO TBCR
40.0000 meq | EXTENDED_RELEASE_TABLET | Freq: Once | ORAL | Status: AC
  Administered 2015-03-27: 40 meq via ORAL
  Filled 2015-03-27: qty 2

## 2015-03-27 MED ORDER — ASPIRIN 325 MG PO TABS: 325.0000 mg | ORAL_TABLET | Freq: Every day | ORAL | Status: DC

## 2015-03-27 MED ORDER — ENOXAPARIN SODIUM 40 MG/0.4ML ~~LOC~~ SOLN
40.0000 mg | Freq: Every day | SUBCUTANEOUS | Status: DC
  Administered 2015-03-27: 40 mg via SUBCUTANEOUS
  Filled 2015-03-27: qty 0.4

## 2015-03-27 NOTE — Discharge Summary (Signed)
Physician Discharge Summary  April Randall [id number] DOB: Jun 23, 1950 DOA: 03/26/2015  PCP: No primary care provider on file.  Admit date: 03/26/2015 Discharge date: 03/27/2015  Time spent: 45 minutes  Recommendations for Outpatient Follow-up:  Patient will be discharged to home.  Patient will need to follow up with primary care provider within one week of discharge, repeat BMP.  Patient should continue medications as prescribed.  Patient should follow a heart healthy diet.   Discharge Diagnoses:  Transient global amnesia Essential hypertension Hypokalemia Hyperlipidemia  Discharge Condition: Stable  Diet recommendation: Heart healthy  Filed Weights   03/26/15 2149 03/27/15 0030  Weight: 62.1 kg (136 lb 14.5 oz) 59.6 kg (131 lb 6.3 oz)    History of present illness:  On 03/26/2015 by Dr. Ivor Costa April Randall is a 64 y.o. female with PMH of hypertension, arthritis, who presents with confusion and loss of memory.  Per report, pt became confused at work at about 8:00PM. She is able to recognize her daughter and knows her own name, but cannot recall the month, year, or how old she is.She did not pass out. The episode lasted for about 20 minutes. She denies unilateral weakness, chest pain, palpitation, shortness of breath. No abdominal pain, diarrhea, symptoms of UTI, rashes, fever or chills. Currently she looks at her normal health condition per her daughter. No complaints. Daughter states that the patient had a similar episode last year. She had negative CT scan of her head per her daughter.  In ED, patient was found to have negative CT head, INR 1.05, PTT 29, WBC 4.8, temperature normal, no tachycardia, potassium 2.9, renal function normal. Patient's admitted to inpatient for further evaluation and treatment. Neurology was consulted.  Hospital Course:  Transient global amnesia -Etiology unclear, appears to have resolved -Neurology consulted and appreciated, recommended  MRI and EEG -CT head: No acute cranial pathology -MRI brain: No acute cranial process -EEG: Normal, no evidence of epileptic disorder is demonstrated -UDS unremarkable -UA unremarkable -Continue aspirin  Essential hypertension -Continue amlodipine, lisinopril -Continue HCTZ on discharge  Hypokalemia -Potassium 2.9 on admission -Currently 3.4, continue to replace -Magnesium 2.2 -Repeat BMP in 1 week  Hyperlipidemia -Total cholesterol 201, triglycerides 36, HDL 68, LDL 126 -Started on low-dose statin  Procedures: EEG  Consultations: Neurology  Discharge Exam: Filed Vitals:   03/27/15 1320 03/27/15 1337  BP: 116/65 101/65  Pulse: 70 72  Temp: 97.9 F (36.6 C) 98.3 F (36.8 C)  Resp: 17 18     General: Well developed, well nourished, NAD  HEENT: NCAT,mucous membranes moist.  Cardiovascular: S1 S2 auscultated, RRR, no murmurs  Respiratory: Clear to auscultation bilaterally   Abdomen: Soft, nontender, nondistended, + bowel sounds  Extremities: warm dry without cyanosis clubbing or edema  Neuro: AAOx3, nonfocal  Psych: Normal affect and demeanor   Discharge Instructions     Medication List    ASK your doctor about these medications        amLODipine 10 MG tablet  Commonly known as:  NORVASC  Take 10 mg by mouth daily.     amoxicillin 500 MG capsule  Commonly known as:  AMOXIL  Take 1 capsule (500 mg total) by mouth 2 (two) times daily.     hydrochlorothiazide 25 MG tablet  Commonly known as:  HYDRODIURIL  Take 25 mg by mouth daily.     lisinopril 20 MG tablet  Commonly known as:  PRINIVIL,ZESTRIL  Take 20 mg by mouth daily.       No  Known Allergies    The results of significant diagnostics from this hospitalization (including imaging, microbiology, ancillary and laboratory) are listed below for reference.    Significant Diagnostic Studies: Ct Head Wo Contrast  03/26/2015  CLINICAL DATA:  Code stroke.  Memory loss.  Initial encounter.  EXAM: CT HEAD WITHOUT CONTRAST TECHNIQUE: Contiguous axial images were obtained from the base of the skull through the vertex without intravenous contrast. COMPARISON:  None. FINDINGS: There is no evidence of acute infarction, mass lesion, or intra- or extra-axial hemorrhage on CT. Scattered chronic lacunar infarcts are seen at the basal ganglia bilaterally. Mild periventricular white matter change likely reflects small vessel ischemic microangiopathy. Slightly decreased attenuation at the inferior temporal lobes is thought to be artifactual in nature. The posterior fossa, including the cerebellum, brainstem and fourth ventricle, is within normal limits. The third and lateral ventricles are unremarkable in appearance. The cerebral hemispheres demonstrate grossly normal gray-white differentiation. No mass effect or midline shift is seen. There is no evidence of fracture; visualized osseous structures are unremarkable in appearance. The visualized portions of the orbits are within normal limits. There is partial opacification of the left maxillary sinus. The remaining paranasal sinuses and mastoid air cells are well-aerated. No significant soft tissue abnormalities are seen. IMPRESSION: 1. No acute intracranial pathology seen on CT. 2. Scattered chronic lacunar infarcts at the basal ganglia bilaterally. 3. Mild small vessel ischemic microangiopathy. 4. Partial opacification of the left maxillary sinus. These results were called by telephone at the time of interpretation on 03/26/2015 at 9:53 pm to Dr. Leonel Ramsay, who verbally acknowledged these results. Electronically Signed   By: Garald Balding M.D.   On: 03/26/2015 21:55   Mr Brain Wo Contrast  03/27/2015  CLINICAL DATA:  20 minute episode of confusion and memory loss this evening. Similar episode last year. History of hypertension. EXAM: MRI HEAD WITHOUT CONTRAST MRA HEAD WITHOUT CONTRAST TECHNIQUE: Multiplanar, multiecho pulse sequences of the brain and  surrounding structures were obtained without intravenous contrast. Angiographic images of the head were obtained using MRA technique without contrast. COMPARISON:  CT head March 26, 2015 at 2144 hours FINDINGS: MRI HEAD FINDINGS The ventricles and sulci are normal for patient's age. No abnormal parenchymal signal, mass lesions, mass effect. No reduced diffusion to suggest acute ischemia. No susceptibility artifact to suggest hemorrhage. A few subcentimeter supratentorial white matter T2 hyperintensities are less than expected for age. Old Small RIGHT cerebellar infarct. No abnormal extra-axial fluid collections. No extra-axial masses though, contrast enhanced sequences would be more sensitive. Normal major intracranial vascular flow voids seen at the skull base. Ocular globes and orbital contents are unremarkable though not tailored for evaluation. No abnormal sellar expansion. No suspicious calvarial bone marrow signal. Craniocervical junction maintained. LEFT maxillary sinus mucosal thickening with air-fluid level. RIGHT sphenoid sinus mucosal thickening. The mastoid air cells are well aerated. Moderate degenerative change of the included cervical spine. MRA HEAD FINDINGS Anterior circulation: Normal flow related enhancement of the included cervical, petrous, cavernous and supraclinoid internal carotid arteries. Patent anterior communicating artery. Normal flow related enhancement of the anterior and middle cerebral arteries, including distal segments. No large vessel occlusion, high-grade stenosis, abnormal luminal irregularity, aneurysm. Posterior circulation: LEFT vertebral artery is dominant. Basilar artery is patent, with normal flow related enhancement of the main branch vessels. Robust RIGHT posterior communicating artery. Normal flow related enhancement of the posterior cerebral arteries. No large vessel occlusion, high-grade stenosis, abnormal luminal irregularity, aneurysm. IMPRESSION: MRI HEAD: No  acute intracranial process. Minimal  white matter changes, less than expected for age. Old small RIGHT cerebellar infarct. LEFT maxillary and RIGHT sphenoid sinusitis. MRA HEAD: Normal. Electronically Signed   By: Elon Alas M.D.   On: 03/27/2015 00:25   Mm Digital Screening Bilateral  02/26/2015  CLINICAL DATA:  Screening. EXAM: DIGITAL SCREENING BILATERAL MAMMOGRAM WITH CAD COMPARISON:  Previous exam(s). ACR Breast Density Category b: There are scattered areas of fibroglandular density. FINDINGS: There are no findings suspicious for malignancy. Images were processed with CAD. IMPRESSION: No mammographic evidence of malignancy. A result letter of this screening mammogram will be mailed directly to the patient. RECOMMENDATION: Screening mammogram in one year. (Code:SM-B-01Y) BI-RADS CATEGORY  1: Negative. Electronically Signed   By: Lajean Manes M.D.   On: 02/26/2015 16:31   Mr Jodene Nam Head/brain Wo Cm  03/27/2015  CLINICAL DATA:  20 minute episode of confusion and memory loss this evening. Similar episode last year. History of hypertension. EXAM: MRI HEAD WITHOUT CONTRAST MRA HEAD WITHOUT CONTRAST TECHNIQUE: Multiplanar, multiecho pulse sequences of the brain and surrounding structures were obtained without intravenous contrast. Angiographic images of the head were obtained using MRA technique without contrast. COMPARISON:  CT head March 26, 2015 at 2144 hours FINDINGS: MRI HEAD FINDINGS The ventricles and sulci are normal for patient's age. No abnormal parenchymal signal, mass lesions, mass effect. No reduced diffusion to suggest acute ischemia. No susceptibility artifact to suggest hemorrhage. A few subcentimeter supratentorial white matter T2 hyperintensities are less than expected for age. Old Small RIGHT cerebellar infarct. No abnormal extra-axial fluid collections. No extra-axial masses though, contrast enhanced sequences would be more sensitive. Normal major intracranial vascular flow voids seen  at the skull base. Ocular globes and orbital contents are unremarkable though not tailored for evaluation. No abnormal sellar expansion. No suspicious calvarial bone marrow signal. Craniocervical junction maintained. LEFT maxillary sinus mucosal thickening with air-fluid level. RIGHT sphenoid sinus mucosal thickening. The mastoid air cells are well aerated. Moderate degenerative change of the included cervical spine. MRA HEAD FINDINGS Anterior circulation: Normal flow related enhancement of the included cervical, petrous, cavernous and supraclinoid internal carotid arteries. Patent anterior communicating artery. Normal flow related enhancement of the anterior and middle cerebral arteries, including distal segments. No large vessel occlusion, high-grade stenosis, abnormal luminal irregularity, aneurysm. Posterior circulation: LEFT vertebral artery is dominant. Basilar artery is patent, with normal flow related enhancement of the main branch vessels. Robust RIGHT posterior communicating artery. Normal flow related enhancement of the posterior cerebral arteries. No large vessel occlusion, high-grade stenosis, abnormal luminal irregularity, aneurysm. IMPRESSION: MRI HEAD: No acute intracranial process. Minimal white matter changes, less than expected for age. Old small RIGHT cerebellar infarct. LEFT maxillary and RIGHT sphenoid sinusitis. MRA HEAD: Normal. Electronically Signed   By: Elon Alas M.D.   On: 03/27/2015 00:25    Microbiology: No results found for this or any previous visit (from the past 240 hour(s)).   Labs: Basic Metabolic Panel:  Recent Labs Lab 03/26/15 2142 03/26/15 2153 03/27/15 0410 03/27/15 1136  NA 138 139  --  141  K 3.0* 2.9*  --  3.4*  CL 102 99*  --  107  CO2 26  --   --  26  GLUCOSE 127* 122*  --  103*  BUN 13 18  --  12  CREATININE 0.87 0.90  --  0.83  CALCIUM 11.2*  --   --  11.2*  MG  --   --  2.2  --    Liver Function Tests:  Recent Labs Lab  03/26/15 2142  AST 28  ALT 21  ALKPHOS 90  BILITOT 0.8  PROT 7.2  ALBUMIN 4.1   No results for input(s): LIPASE, AMYLASE in the last 168 hours. No results for input(s): AMMONIA in the last 168 hours. CBC:  Recent Labs Lab 03/26/15 2142 03/26/15 2153  WBC 4.8  --   NEUTROABS 3.2  --   HGB 14.1 15.6*  HCT 42.1 46.0  MCV 89.8  --   PLT 204  --    Cardiac Enzymes: No results for input(s): CKTOTAL, CKMB, CKMBINDEX, TROPONINI in the last 168 hours. BNP: BNP (last 3 results) No results for input(s): BNP in the last 8760 hours.  ProBNP (last 3 results) No results for input(s): PROBNP in the last 8760 hours.  CBG:  Recent Labs Lab 03/26/15 2156  GLUCAP 128*       Signed:  Cristal Ford  [facility] 03/27/2015, 2:02 PM

## 2015-03-27 NOTE — Care Management Note (Signed)
Case Management Note  Patient Details  Name: April Randall MRN: [id number] Date of Birth: 30-Jun-1950  Subjective/Objective:                    Action/Plan: No HH needs. Will sign off.   Expected Discharge Date:  03/29/15               Expected Discharge Plan:     In-House Referral:     Discharge planning Services  CM Consult  Post Acute Care Choice:    Choice offered to:     DME Arranged:    DME Agency:     HH Arranged:    HH Agency:     Status of Service:  Completed, signed off  Medicare Important Message Given:    Date Medicare IM Given:    Medicare IM give by:    Date Additional Medicare IM Given:    Additional Medicare Important Message give by:     If discussed at [place] of Stay Meetings, dates discussed:    Additional Comments:  Delrae Sawyers, RN 03/27/2015, 3:08 PM

## 2015-03-27 NOTE — Discharge Instructions (Signed)
Transient Global Amnesia Transient global amnesia causes a sudden and temporary (transient) loss of memory (amnesia). While you may recall memories from your distant past, including being able to recognize people you know well, you may not recall things that happened more recently in the past days, months, or even year. A transient global amnesia episode does not last longer than 24 hours.  Transient global amnesia does not affect your other brain functions. Your memory usually returns to normal after an episode is over. One episode of transient global amnesia does not make you more likely to have a stroke, a relapse, or other complications.  CAUSES  The cause of this condition is not known.  RISK FACTORS  Transient global amnesia is more likely to develop in people who:  Are 29-34 years old.  Have a history of migraine headaches. SYMPTOMS  The main symptoms of this condition include:  The inability to remember recent events.  Asking repetitive questions about the situation and surroundings and not recalling the answers to these questions. Other symptoms include:  Restlessness and nervousness.  Confusion.  Headaches.  Dizziness.  Nausea. DIAGNOSIS  Your health care provider may suspect transient global amnesia based on your symptoms. Your health care provider will do a physical exam. This may include a test to check your mental abilities (cognitive evaluation). You may also have imaging studies done to check your brain function. These may include:   Electroencephalography (EEG).  Diffusion-weighted imaging (DWI).  MRI. TREATMENT  There is no treatment for this condition. An episode typically goes away on its own after a few hours. If you also have a seizure or migraine during an episode, you will receive treatment for these conditions. This may include medicines. HOME CARE INSTRUCTIONS  Take medicines only as directed by your health care provider.  Tell your family or  friends that you have transient global amnesia. Ask them to help you avoid physical exertion, including sexual intercourse, swimming, and straining while holding your breath (Valsalva maneuver), until the episode passes. These are events that can bring on transient global amnesia attacks. SEEK MEDICAL CARE IF:   You have a migraine and it does not go away after you have followed your treatment plan for this condition.  You have a seizure for the first time, or a seizure that is different from seizures you normally have.  You experience transient global amnesia repeatedly.   This information is not intended to replace advice given to you by your health care provider. Make sure you discuss any questions you have with your health care provider.   Document Released: 04/20/2004 Document Revised: 12/02/2014 Document Reviewed: 11/26/2013 Elsevier Interactive Patient Education [employer].

## 2015-03-27 NOTE — Progress Notes (Signed)
Pt discharged from hospital per MD order. Educated pt on discharge instructions. Pt verbalized understanding of instructions. All questions and concerns were answered. Removed IV from pt before discharge. Pt ambulated out of hospital with family.

## 2015-03-27 NOTE — Progress Notes (Signed)
Physical Therapy Evaluation and Discharge  Patient Details Name: April Randall MRN: [id number] DOB: 07/24/1950 Today's Date: 03/27/2015   History of Present Illness  64 y.o. female with PMH of hypertension, arthritis, who presented to ED with confusion and loss of memory.  Clinical Impression  Patient presents post episode of transient global amnesia without any residual physical impairments.  Balance and mobility are reported by patient to be at baseline, and patient is Independent without assistive device.  No skilled PT intervention required at this time.  Patient is low fall risk.  PT will sign off on this patient, please re submit consult if status changes.    Follow Up Recommendations No PT follow up    Equipment Recommendations  None recommended by PT    Recommendations for Other Services       Precautions / Restrictions Precautions Precautions: None Restrictions Weight Bearing Restrictions: No      Mobility  Bed Mobility Overal bed mobility: Independent                Transfers Overall transfer level: Independent Equipment used: None                Ambulation/Gait Ambulation/Gait assistance: Independent Ambulation Distance (Feet): 100 Feet Assistive device: None Gait Pattern/deviations: Step-through pattern;Trunk flexed Gait velocity: Mildly decreased      Stairs Stairs: Yes Stairs assistance: Supervision Stair Management: Two rails;Alternating pattern Number of Stairs: 10 General stair comments: Reports mild L knee pain on stairs (baseline)  Wheelchair Mobility    Modified Rankin (Stroke Patients Only) Modified Rankin (Stroke Patients Only) Pre-Morbid Rankin Score: No symptoms Modified Rankin: No symptoms     Balance Overall balance assessment: Independent                               Standardized Balance Assessment Standardized Balance Assessment :  (Tinetti 26/28, low fall risk)           Pertinent  Vitals/Pain      Home Living Family/patient expects to be discharged to:: Private residence Living Arrangements: Children Available Help at Discharge: Family;Available PRN/intermittently Type of Home: Apartment Home Access: Stairs to enter   Entrance Stairs-Number of Steps: 12   Home Equipment: None Additional Comments: Lives on 2nd floor, wants to move to ground level due to L knee pain history.    Prior Function Level of Independence: Independent         Comments: works full time in Ambulance person at [employer]        Extremity/Trunk Assessment   Upper Extremity Assessment: Overall WFL for tasks assessed           Lower Extremity Assessment: Overall WFL for tasks assessed         Communication   Communication: No difficulties  Cognition                            General Comments      Exercises        Assessment/Plan    PT Assessment Patent does not need any further PT services  PT Diagnosis Altered mental status   PT Problem List    PT Treatment Interventions     PT Goals (Current goals can be found in the Care Plan section) Acute Rehab PT Goals Patient Stated Goal: Patient wants to know why she had episode PT  Goal Formulation: All assessment and education complete, DC therapy    Frequency     Barriers to discharge        Co-evaluation               End of Session Equipment Utilized During Treatment: Gait belt Activity Tolerance: Patient tolerated treatment well Patient left: in bed;with call bell/phone within reach Nurse Communication: Mobility status    Functional Assessment Tool Used: Clinical assessment Functional Limitation: Mobility: Walking and moving around Mobility: Walking and Moving Around Current Status [id number]): At least 1 percent but less than 20 percent impaired, limited or restricted Mobility: Walking and Moving Around Goal Status [phone]): At least 1 percent but less than 20 percent  impaired, limited or restricted Mobility: Walking and Moving Around Discharge Status [phone]): At least 1 percent but less than 20 percent impaired, limited or restricted    Time: 0935-0950 PT Time Calculation (min) (ACUTE ONLY): 15 min   Charges:   PT Evaluation $Initial PT Evaluation Tier I: 1 Procedure     PT G Codes:   PT G-Codes **NOT FOR INPATIENT CLASS** Functional Assessment Tool Used: Clinical assessment Functional Limitation: Mobility: Walking and moving around Mobility: Walking and Moving Around Current Status [id number]): At least 1 percent but less than 20 percent impaired, limited or restricted Mobility: Walking and Moving Around Goal Status [phone]): At least 1 percent but less than 20 percent impaired, limited or restricted Mobility: Walking and Moving Around Discharge Status [phone]): At least 1 percent but less than 20 percent impaired, limited or restricted    April Randall, April Randall L 03/27/2015, 10:46 AM

## 2015-03-27 NOTE — Progress Notes (Signed)
Subjective: Patient had no new complaints. Altered mental status with memory difficulty has resolved. Patient has no memory for most of the events in the last half of the day, yesterday. She does not remember whether she completed her shift at work yesterday, and also does not remember how she got to the hospital being in the emergency room.  Objective: Current vital signs: BP 125/74 mmHg  Pulse 76  Temp(Src) 98.2 F (36.8 C) (Oral)  Resp 17  Ht 4\' 8"  (1.422 m)  Wt 59.6 kg (131 lb 6.3 oz)  BMI 29.47 kg/m2  SpO2 100%  Neurologic Exam: Alert and in no acute distress. She was well-oriented to time as well as place. She was normal. Short-term and long-term memory normal, except for events of yesterday evening and last night. Speech was normal. His was symmetrical. Strength and coordination of extremities was normal.  Medications: I have reviewed the patient's current medications.  Assessment/Plan: 64 year old lady presenting with transient altered mental status with moderate difficulty. He had no focal deficits.Marland Kitchen MRI and MRA of the brain were normal. EEG is pending. Etiology for transient confusion and loss of memory remains unclear.  No change in current management recommended. No further neurodiagnostic studies are indicated if EEG is unremarkable. Continue aspirin after discharge, 81 mg per day, and follow up with primary care physician on routine basis.  C.R. Nicole Kindred, MD [facility] [phone]  03/27/2015  12:32 PM

## 2015-03-27 NOTE — Progress Notes (Signed)
Patient admitted to room 6m16 oriented to room and unit  Central tele called and verified X2.

## 2015-03-27 NOTE — Progress Notes (Signed)
Occupational Therapy Discharge Patient Details Name: April Randall MRN: [id number] DOB: 10/12/1950 Today's Date: 03/27/2015 Time:  -     Patient discharged from OT services secondary to screen by PT Ty at this time. No acute Ot needs .  Please see latest therapy progress note for current level of functioning and progress toward goals.    Progress and discharge plan discussed with patient and/or caregiver: Patient/Caregiver agrees with plan  GO     Vonita Moss   OTR/L Pager: [phone] Office: [phone] .  03/27/2015, 1:53 PM

## 2015-03-27 NOTE — Progress Notes (Signed)
Bedside EEG completed, results pending. 

## 2015-03-27 NOTE — ED Notes (Signed)
Escorted pt to floor from MRI by this RN

## 2015-03-27 NOTE — Procedures (Signed)
ELECTROENCEPHALOGRAM REPORT  Patient: [name]       Room #: [id number] EEG No. ID: [id number] Age: 64 y.o.        Sex: female Referring Physician: Curly Shores Report Date:  03/27/2015        Interpreting Physician: Anthony Sar  History: April Randall is an 64 y.o. female history of arthritis and hypertension who presented with mental status with confusion and memory difficulty. CT scan of her head as well as MRI were unremarkable. Confusion subsequently resolved.  Indications for study:  Rule out encephalopathy; rule out seizure disorder.  Technique: This is an 18 channel routine scalp EEG performed at the bedside with bipolar and monopolar montages arranged in accordance to the international 10/20 system of electrode placement.   Description: This EEG recording was performed during wakefulness and during sleep. Predominant activity during wakefulness consisted of 10  Hz alpha rhythm with good attenuation with eye opening. Photic stimulation produces symmetrical occipital driving response.  Hyperventilation was not performed. There was symmetrical slowing of background activity with mixed irregular delta and theta activity diffusely during sleep. Symmetrical vertex waves, sleep spindles and K-complexes are recorded during stage II of sleep. No epileptiform discharges were recorded. There was no abnormal slowing of cerebral activity.  Interpretation: This is a normal EEG recording during wakefulness and during sleep. No evidence of an epileptic disorder was demonstrated. However, a normal EEG recording in and of itself does not rule out seizure disorder.   Rush Farmer M.D. [facility] [phone]

## 2015-03-30 LAB — HEMOGLOBIN A1C
Hgb A1c MFr Bld: 5.8 % — ABNORMAL HIGH (ref 4.8–5.6)
Mean Plasma Glucose: 120 mg/dL

## 2015-04-08 ENCOUNTER — Ambulatory Visit
Admission: RE | Admit: 2015-04-08 | Discharge: 2015-04-08 | Disposition: A | Discharge status: Home / Self Care | Payer: BLUE CROSS/BLUE SHIELD | Source: Ambulatory Visit | Attending: Internal Medicine | Admitting: Internal Medicine

## 2015-04-08 DIAGNOSIS — E2839 Other primary ovarian failure: Secondary | ICD-10-CM

## 2015-09-02 ENCOUNTER — Other Ambulatory Visit: Payer: Self-pay | Admitting: Internal Medicine

## 2015-09-02 DIAGNOSIS — E210 Primary hyperparathyroidism: Secondary | ICD-10-CM

## 2015-09-14 ENCOUNTER — Encounter (HOSPITAL_COMMUNITY)
Admission: RE | Admit: 2015-09-14 | Discharge: 2015-09-14 | Disposition: A | Discharge status: Home / Self Care | Payer: BLUE CROSS/BLUE SHIELD | Source: Ambulatory Visit | Attending: Internal Medicine | Admitting: Internal Medicine

## 2015-09-14 DIAGNOSIS — E210 Primary hyperparathyroidism: Secondary | ICD-10-CM | POA: Diagnosis not present

## 2015-09-14 MED ORDER — TECHNETIUM TC 99M SESTAMIBI GENERIC - CARDIOLITE
25.0000 | Freq: Once | INTRAVENOUS | Status: AC | PRN
  Administered 2015-09-14: 25 via INTRAVENOUS

## 2016-02-22 ENCOUNTER — Other Ambulatory Visit: Payer: Self-pay | Admitting: Internal Medicine

## 2016-02-22 DIAGNOSIS — Z1231 Encounter for screening mammogram for malignant neoplasm of breast: Secondary | ICD-10-CM

## 2016-03-16 ENCOUNTER — Ambulatory Visit
Admission: RE | Admit: 2016-03-16 | Discharge: 2016-03-16 | Disposition: A | Discharge status: Home / Self Care | Payer: BLUE CROSS/BLUE SHIELD | Source: Ambulatory Visit | Attending: Internal Medicine | Admitting: Internal Medicine

## 2016-03-16 DIAGNOSIS — Z1231 Encounter for screening mammogram for malignant neoplasm of breast: Secondary | ICD-10-CM

## 2016-11-18 ENCOUNTER — Ambulatory Visit (INDEPENDENT_AMBULATORY_CARE_PROVIDER_SITE_OTHER): Payer: Medicare Other | Admitting: Physician Assistant

## 2016-11-18 ENCOUNTER — Encounter: Payer: Self-pay | Admitting: Physician Assistant

## 2016-11-18 VITALS — BP 140/90 | HR 84 | Temp 98.2°F | Resp 16 | Ht <= 58 in | Wt 148.0 lb

## 2016-11-18 DIAGNOSIS — M542 Cervicalgia: Secondary | ICD-10-CM

## 2016-11-18 MED ORDER — DICLOFENAC SODIUM 1 % TD GEL: 2.0000 g | Freq: Four times a day (QID) | TRANSDERMAL | 0 refills | Status: AC

## 2016-11-18 NOTE — Patient Instructions (Addendum)
Please use warm compresses for 15 minutes 3 times per day.  Stretch three times per day.  You can follow this up with icing.   You can use tylenol for pain 500mg  every 6 hours Try using the gel along the neck  Cervical Strain and Sprain Rehab Ask your health care provider which exercises are safe for you. Do exercises exactly as told by your health care provider and adjust them as directed. It is normal to feel mild stretching, pulling, tightness, or discomfort as you do these exercises, but you should stop right away if you feel sudden pain or your pain gets worse.Do not begin these exercises until told by your health care provider. Stretching and range of motion exercises These exercises warm up your muscles and joints and improve the movement and flexibility of your neck. These exercises also help to relieve pain, numbness, and tingling. Exercise A: Cervical side bend  1. Using good posture, sit on a stable chair or stand up. 2. Without moving your shoulders, slowly tilt your left / right ear to your shoulder until you feel a stretch in your neck muscles. You should be looking straight ahead. 3. Hold for __________ seconds. 4. Repeat with the other side of your neck. Repeat __________ times. Complete this exercise __________ times a day. Exercise B: Cervical rotation  1. Using good posture, sit on a stable chair or stand up. 2. Slowly turn your head to the side as if you are looking over your left / right shoulder. ? Keep your eyes level with the ground. ? Stop when you feel a stretch along the side and the back of your neck. 3. Hold for __________ seconds. 4. Repeat this by turning to your other side. Repeat __________ times. Complete this exercise __________ times a day. Exercise C: Thoracic extension and pectoral stretch 1. Roll a towel or a small blanket so it is about 4 inches (10 cm) in diameter. 2. Lie down on your back on a firm surface. 3. Put the towel lengthwise, under  your spine in the middle of your back. It should not be not under your shoulder blades. The towel should line up with your spine from your middle back to your lower back. 4. Put your hands behind your head and let your elbows fall out to your sides. 5. Hold for __________ seconds. Repeat __________ times. Complete this exercise __________ times a day. Strengthening exercises These exercises build strength and endurance in your neck. Endurance is the ability to use your muscles for a long time, even after your muscles get tired. Exercise D: Upper cervical flexion, isometric 1. Lie on your back with a thin pillow behind your head and a small rolled-up towel under your neck. 2. Gently tuck your chin toward your chest and nod your head down to look toward your feet. Do not lift your head off the pillow. 3. Hold for __________ seconds. 4. Release the tension slowly. Relax your neck muscles completely before you repeat this exercise. Repeat __________ times. Complete this exercise __________ times a day. Exercise E: Cervical extension, isometric  1. Stand about 6 inches (15 cm) away from a wall, with your back facing the wall. 2. Place a soft object, about 6-8 inches (15-20 cm) in diameter, between the back of your head and the wall. A soft object could be a small pillow, a ball, or a folded towel. 3. Gently tilt your head back and press into the soft object. Keep your jaw and forehead  relaxed. 4. Hold for __________ seconds. 5. Release the tension slowly. Relax your neck muscles completely before you repeat this exercise. Repeat __________ times. Complete this exercise __________ times a day. Posture and body mechanics  Body mechanics refers to the movements and positions of your body while you do your daily activities. Posture is part of body mechanics. Good posture and healthy body mechanics can help to relieve stress in your body's tissues and joints. Good posture means that your spine is in its  natural S-curve position (your spine is neutral), your shoulders are pulled back slightly, and your head is not tipped forward. The following are general guidelines for applying improved posture and body mechanics to your everyday activities. Standing  When standing, keep your spine neutral and keep your feet about hip-width apart. Keep a slight bend in your knees. Your ears, shoulders, and hips should line up.  When you do a task in which you stand in one place for a long time, place one foot up on a stable object that is 2-4 inches (5-10 cm) high, such as a footstool. This helps keep your spine neutral. Sitting   When sitting, keep your spine neutral and your keep feet flat on the floor. Use a footrest, if necessary, and keep your thighs parallel to the floor. Avoid rounding your shoulders, and avoid tilting your head forward.  When working at a desk or a computer, keep your desk at a height where your hands are slightly lower than your elbows. Slide your chair under your desk so you are close enough to maintain good posture.  When working at a computer, place your monitor at a height where you are looking straight ahead and you do not have to tilt your head forward or downward to look at the screen. Resting When lying down and resting, avoid positions that are most painful for you. Try to support your neck in a neutral position. You can use a contour pillow or a small rolled-up towel. Your pillow should support your neck but not push on it. This information is not intended to replace advice given to you by your health care provider. Make sure you discuss any questions you have with your health care provider. Document Released: 03/13/2005 Document Revised: 11/18/2015 Document Reviewed: 02/17/2015 Elsevier Interactive Patient Education  2018 [employer].    IF you received an x-ray today, you will receive an invoice from [facility]. Please contact [facility] at  [phone] with questions or concerns regarding your invoice.   IF you received labwork today, you will receive an invoice from [facility]. Please contact [facility] at [phone] with questions or concerns regarding your invoice.   Our billing staff will not be able to assist you with questions regarding bills from these companies.  You will be contacted with the lab results as soon as they are available. The fastest way to get your results is to activate your My Chart account. Instructions are located on the last page of this paperwork. If you have not heard from [place] regarding the results in 2 weeks, please contact this office.

## 2016-11-18 NOTE — Progress Notes (Signed)
PRIMARY CARE AT [facility] [address] [place] [address] [phone]  Date:  11/18/2016   Name:  April Randall   DOB:  1950/12/18   MRN:  [id number]  PCP:  Nolene Ebbs, MD    History of Present Illness:  April Randall is a 66 y.o. female patient who presents to PCP with  Chief Complaint  Patient presents with  . Neck Pain    x 3 days  . Headache    x 3days/ pt was seen at her pcp for this and neck pain on 11/13/16. she feels a little better, but not competely.     Patient reports 3 days of neck pain that is along the back of her neck radiates to her head in the back.  She has no dizziness, numbness or tingling.  No hx of trauma or injury.  She reports that she has been sleeping in a new area what air vent directly on her neck.     Patient Active Problem List   Diagnosis Date Noted  . Acute encephalopathy 03/26/2015  . Hypokalemia 03/26/2015  . 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 Arthritis   . Hypertension   . Essential hypertension     Past Medical History:  Diagnosis Date  . Arthritis   . Hypertension     Past Surgical History:  Procedure Laterality Date  . REPLACEMENT TOTAL KNEE Right     Social History  Substance Use Topics  . Smoking status: Never Smoker  . Smokeless tobacco: Not on file  . Alcohol use No    Family History  Problem Relation Age of Onset  . Hypertension Mother     No Known Allergies  Medication list has been reviewed and updated.  Current Outpatient Prescriptions on File Prior to Visit  Medication Sig Dispense Refill  . amLODipine (NORVASC) 10 MG tablet Take 10 mg by mouth daily.    Marland Kitchen aspirin 325 MG tablet Take 1 tablet (325 mg total) by mouth daily. 30 tablet 0  . hydrochlorothiazide (HYDRODIURIL) 25 MG tablet Take 25 mg by mouth daily.    Marland Kitchen lisinopril (PRINIVIL,ZESTRIL) 20 MG tablet Take 20 mg by mouth daily.    . simvastatin (ZOCOR) 20 MG tablet Take 1 tablet (20 mg total) by mouth at bedtime. 30 tablet 0   No  current facility-administered medications on file prior to visit.     ROS ROS otherwise unremarkable unless listed above.  Physical Examination: BP 140/90   Pulse 84   Temp 98.2 F (36.8 C) (Oral)   Resp 16   Ht 4\' 8"  (1.422 m)   Wt 148 lb (67.1 kg)   SpO2 98%   BMI 33.18 kg/m  Ideal Body Weight: Weight in (lb) to have BMI = 25: 111.3  Physical Exam  Constitutional: She is oriented to person, place, and time. She appears well-developed and well-nourished. No distress.  HENT:  Head: Normocephalic and atraumatic.  Right Ear: External ear normal.  Left Ear: External ear normal.  Eyes: Pupils are equal, round, and reactive to light. Conjunctivae and EOM are normal.  Neck: Normal range of motion and full passive range of motion without pain. No thyroid mass present.  Pain incited with lateral deviation to the right side, cited at the left posterior cervical.    Cardiovascular: Normal rate.   Pulmonary/Chest: Effort normal. No respiratory distress.  Neurological: She is alert and oriented to person, place, and time.  Skin: She is not diaphoretic.  Psychiatric: She has a normal  mood and affect. Her behavior is normal.     Assessment and Plan: April Randall is a 66 y.o. female who is here today for neck pain. Advised thermotherapy.  nsaid topical at this time. Cervicalgia - Plan: diclofenac sodium (VOLTAREN) 1 % GEL  Ivar Drape, PA-C [facility] 9/3/20184:31 PM

## 2017-03-06 ENCOUNTER — Other Ambulatory Visit: Payer: Self-pay | Admitting: Internal Medicine

## 2017-03-06 DIAGNOSIS — Z1231 Encounter for screening mammogram for malignant neoplasm of breast: Secondary | ICD-10-CM

## 2017-04-04 ENCOUNTER — Ambulatory Visit
Admission: RE | Admit: 2017-04-04 | Discharge: 2017-04-04 | Disposition: A | Discharge status: Home / Self Care | Payer: Medicare Other | Source: Ambulatory Visit | Attending: Internal Medicine | Admitting: Internal Medicine

## 2017-04-04 DIAGNOSIS — Z1231 Encounter for screening mammogram for malignant neoplasm of breast: Secondary | ICD-10-CM

## 2017-04-06 ENCOUNTER — Other Ambulatory Visit: Payer: Self-pay | Admitting: Internal Medicine

## 2017-04-06 DIAGNOSIS — E2839 Other primary ovarian failure: Secondary | ICD-10-CM

## 2017-04-25 ENCOUNTER — Ambulatory Visit
Admission: RE | Admit: 2017-04-25 | Discharge: 2017-04-25 | Disposition: A | Discharge status: Home / Self Care | Payer: Medicare Other | Source: Ambulatory Visit | Attending: Internal Medicine | Admitting: Internal Medicine

## 2017-04-25 DIAGNOSIS — E2839 Other primary ovarian failure: Secondary | ICD-10-CM

## 2017-05-09 ENCOUNTER — Ambulatory Visit
Admission: RE | Admit: 2017-05-09 | Discharge: 2017-05-09 | Disposition: A | Discharge status: Home / Self Care | Payer: Medicare Other | Source: Ambulatory Visit | Attending: Internal Medicine | Admitting: Internal Medicine

## 2017-06-26 ENCOUNTER — Encounter: Payer: Self-pay | Admitting: Physician Assistant

## 2017-09-04 ENCOUNTER — Ambulatory Visit: Payer: Self-pay

## 2017-09-11 ENCOUNTER — Other Ambulatory Visit: Payer: Self-pay

## 2017-09-11 ENCOUNTER — Encounter: Payer: Self-pay | Admitting: Physical Therapy

## 2017-09-11 ENCOUNTER — Ambulatory Visit: Payer: Medicare Other | Attending: Sports Medicine | Admitting: Physical Therapy

## 2017-09-11 DIAGNOSIS — G8929 Other chronic pain: Secondary | ICD-10-CM | POA: Diagnosis present

## 2017-09-11 DIAGNOSIS — R600 Localized edema: Secondary | ICD-10-CM | POA: Diagnosis present

## 2017-09-11 DIAGNOSIS — R2689 Other abnormalities of gait and mobility: Secondary | ICD-10-CM | POA: Insufficient documentation

## 2017-09-11 DIAGNOSIS — M6281 Muscle weakness (generalized): Secondary | ICD-10-CM | POA: Insufficient documentation

## 2017-09-11 DIAGNOSIS — M25562 Pain in left knee: Secondary | ICD-10-CM | POA: Diagnosis present

## 2017-09-11 NOTE — Therapy (Signed)
[facility] [address] [place], [place], [address] Phone: [phone]   Fax:  [phone]  Physical Therapy Evaluation(Pre-op)  Patient Details  Name: April Randall MRN: [id number] Date of Birth: 10/20/1950 Referring Provider: Corky Mull, MD   Encounter Date: 09/11/2017  PT End of Session - 09/11/17 1007    Visit Number  1    Date for PT Re-Evaluation  09/20/17    Authorization Type  Medicare     Authorization Time Period  09/11/17 to 09/20/17    Authorization - Visit Number  1    Authorization - Number of Visits  1    PT Start Time  0940    PT Stop Time  1009 Pt arrived late     PT Time Calculation (min)  29 min    Activity Tolerance  Patient tolerated treatment well;No increased pain    Behavior During Therapy  WFL for tasks assessed/performed       Past Medical History:  Diagnosis Date  . Arthritis   . Hypertension     Past Surgical History:  Procedure Laterality Date  . REPLACEMENT TOTAL KNEE Right     There were no vitals filed for this visit.   Subjective Assessment - 09/11/17 0944    Subjective  Pt reports that she is scheduled for Lt partial knee replacement on the 09/19/17. She was told to come here prior to her surgery. Her Lt knee bothers her during the day. She is planning to go back to work after her surgery. She does not have any issue with stairs, really the only thing that bothers it is when she gets up after sitting for a long time.     Pertinent History  Rt TKA    Currently in Pain?  No/denies typically the lateral knee          [facility] PT Assessment - 09/11/17 0001      Assessment   Medical Diagnosis  preoperative treatment of Lt knee    Referring Provider  Corky Mull, MD    Onset Date/Surgical Date  08/19/17    Prior Therapy  none       Precautions   Precautions  None      Balance Screen   Has the patient fallen in the past 6 months  No    Has the patient had a  decrease in activity level because of a fear of falling?   No    Is the patient reluctant to leave their home because of a fear of falling?   No      Home Film/video editor residence      Prior Function   Level of Independence  Independent      Cognition   Overall Cognitive Status  Within Functional Limits for tasks assessed      ROM / Strength   AROM / PROM / Strength  AROM;Strength      AROM   Overall AROM Comments  Lt and Rt knee AROM full, pain free       Strength   Strength Assessment Site  Hip;Knee    Right/Left Hip  Right;Left    Right Hip Flexion  4/5    Right Hip Extension  3/5    Right Hip ABduction  3/5    Left Hip Flexion  4/5    Left Hip Extension  3/5    Left Hip ABduction  3/5    Right/Left Knee  --  Lt and Rt knee flexion 4/5 MMT      Palpation   Palpation comment  mild tenderness with palpation of lateral joint line.       Transfers   Five time sit to stand comments   13 sec       High Level Balance   High Level Balance Comments  SLS RT: 15 sec, Lt 15 sec with minimal posture sway                 Objective measurements completed on examination: See above findings.      [facility] PT Treatment/Exercise - 09/11/17 0001      Ambulation/Gait   Pre-Gait Activities  pt ascending steps with reciprocal pattern without handrails. Pt descends steps with Rt step to pattern and without handrails     Gait Comments  therapist provided feedback to encourage reciprocal pattern while descending steps and she was able to complete with trunk turned to the Lt       Exercises   Exercises  Knee/Hip      Knee/Hip Exercises: Standing   Heel Raises  Both;20 reps    Hip Extension  Both;1 set;5 reps    Extension Limitations  cues to encourage knee extension      Knee/Hip Exercises: Supine   Bridges  Both;1 set;10 reps    Other Supine Knee/Hip Exercises  clams with green TB x10 reps each              PT Education - 09/11/17  1005    Education Details   Access Code: [email or web address]     Person(s) Educated  Patient    Methods  Explanation;Handout;Verbal cues;Demonstration    Comprehension  Verbalized understanding;Returned demonstration       PT Short Term Goals - 09/11/17 1009      PT SHORT TERM GOAL #1   Title  Pt will demo understanding of her initial HEP to improve strength and mobility prior to surgery on 08/19/17.    Time  1    Period  Weeks    Status  Achieved                Plan - 09/11/17 1111    Clinical Impression Statement  Pt is a pleasant 67 y.o F referred to OPPT with complaints of Lt knee discomfort after sitting for long periods of time. She is planning to undergo Lt partial knee replacement on 08/19/17. At this time, she demonstrates poor hip strength and tightness of the hip flexors which contributes to her flexed posture. Lt knee ROM is within normal limits and she performed well on single leg stance and other functional tests. She would benefit from skilled PT to address her areas of weakness, poor flexibility and posture. She was provided with a HEP to address areas of hip weakness that will benefit her prior to and following her operation. She demonstrated good understanding at this time and will plan to follow up for re-evaluation after her surgery.    History and Personal Factors relevant to plan of care:  Rt TKA    Clinical Presentation  Stable    Clinical Decision Making  Low    Rehab Potential  Excellent    PT Frequency  One time visit to be updated after operation    PT Treatment/Interventions  ADLs/Self Care Home Management;Patient/family education;Cryotherapy;Electrical Stimulation;Moist Heat;Therapeutic exercise;Therapeutic activities;Functional mobility training;Stair training;Gait training;Balance training;Neuromuscular re-education;Manual techniques;Passive range of motion;Scar mobilization;Taping;Dry needling    PT Next Visit Plan  re-evaluation s/p partial knee replacement      PT Home Exercise Plan  bridge, clams in supine (green), B heel raises, standing hip extension    Consulted and Agree with Plan of Care  Patient       Patient will benefit from skilled therapeutic intervention in order to improve the following deficits and impairments:  Decreased activity tolerance, Impaired flexibility, Decreased strength, Decreased range of motion, Hypomobility, Postural dysfunction, Increased muscle spasms, Pain, Improper body mechanics  Visit Diagnosis: Chronic pain of left knee  Muscle weakness (generalized)     Problem List Patient Active Problem List   Diagnosis Date Noted  . Acute encephalopathy 03/26/2015  . Hypokalemia 03/26/2015  . 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 Arthritis   . Hypertension   . Essential hypertension     11:24 AM,09/11/17 Sherol Dade PT, DPT [place] [facility] [place]  [facility] [address], [place], [place], [address] Phone: [phone]   Fax:  [phone]  Name: April Randall MRN: [id number] Date of Birth: 03-21-1951

## 2017-09-20 ENCOUNTER — Ambulatory Visit: Payer: Medicare Other | Admitting: Physical Therapy

## 2017-09-20 ENCOUNTER — Other Ambulatory Visit: Payer: Self-pay

## 2017-09-20 DIAGNOSIS — M6281 Muscle weakness (generalized): Secondary | ICD-10-CM

## 2017-09-20 DIAGNOSIS — M25562 Pain in left knee: Secondary | ICD-10-CM | POA: Diagnosis not present

## 2017-09-20 DIAGNOSIS — R600 Localized edema: Secondary | ICD-10-CM

## 2017-09-20 DIAGNOSIS — R2689 Other abnormalities of gait and mobility: Secondary | ICD-10-CM

## 2017-09-20 NOTE — Therapy (Signed)
[facility] [address] [place], [place], [address] Phone: [phone]   Fax:  [phone]  Physical Therapy Treatment/re-eval  Patient Details  Name: April Randall MRN: [id number] Date of Birth: 1950-08-08 Referring Provider: Corky Mull, MD   Encounter Date: 09/20/2017  PT End of Session - 09/20/17 0925    Visit Number  2    Date for PT Re-Evaluation  11/02/17    Authorization Type  Medicare     Authorization Time Period  09/21/17 to 11/02/17    Authorization - Visit Number  1    Authorization - Number of Visits  10    PT Start Time  0845    PT Stop Time  7824    PT Time Calculation (min)  53 min    Activity Tolerance  Patient tolerated treatment well;No increased pain    Behavior During Therapy  WFL for tasks assessed/performed       Past Medical History:  Diagnosis Date  . Arthritis   . Hypertension     Past Surgical History:  Procedure Laterality Date  . REPLACEMENT TOTAL KNEE Right     There were no vitals filed for this visit.  Subjective Assessment - 09/20/17 0851    Subjective  Pt underwent a partial Lt knee replacement on 09/17/17. She says things are going well. She has some pain when walking, but overall she feels it was successful. She says she is trying to walk alot at home.     Pertinent History  Rt TKA    Currently in Pain?  Yes    Pain Score  1     Pain Location  Knee    Pain Orientation  Left    Pain Descriptors / Indicators  Aching    Pain Type  Surgical pain    Pain Radiating Towards  none     Pain Onset  In the past 7 days    Pain Frequency  Constant    Aggravating Factors   being up on her feet alot    Pain Relieving Factors  resting, ice     Effect of Pain on Daily Activities  none          [facility] PT Assessment - 09/20/17 0001      Assessment   Medical Diagnosis  preoperative treatment of Lt knee    Referring Provider  Corky Mull, MD    Onset Date/Surgical Date   09/17/17    Prior Therapy  none       Precautions   Precautions  None      Balance Screen   Has the patient fallen in the past 6 months  No    Has the patient had a decrease in activity level because of a fear of falling?   No    Is the patient reluctant to leave their home because of a fear of falling?   No      Home Film/video editor residence      Prior Function   Level of Independence  Independent      Cognition   Overall Cognitive Status  Within Functional Limits for tasks assessed      Observation/Other Assessments   Observations  Pt wearing compression garment       AROM   Overall AROM Comments  Rt knee AROM WNL    AROM Assessment Site  Knee    Right/Left Knee  Left    Left Knee  Extension  8 lacking     Left Knee Flexion  90      Strength   Right Hip Flexion  4/5    Right Hip Extension  3/5    Right Hip ABduction  3/5    Left Hip Flexion  3/5    Left Hip Extension  3/5    Left Hip ABduction  3/5    Right/Left Knee  Left    Left Knee Flexion  -- unable to assess secondary to pain    Left Knee Extension  2/5      Palpation   Palpation comment  tenderness along Lt quadriceps      Bed Mobility   Bed Mobility  -- ModA to advance LLE on/off mat table       Ambulation/Gait   Gait Comments  Pt ambulating with RW, decreased stance on Lt      High Level Balance   High Level Balance Comments  SLS unable on Lt, Rt 15 sec                   [facility] Adult PT Treatment/Exercise - 09/20/17 0001      Transfers   Five time sit to stand comments   20 sec, weight shifted onto RLE      Knee/Hip Exercises: Supine   Quad Sets  Left;1 set;15 reps    Heel Slides  1 set;Left;5 reps    Other Supine Knee/Hip Exercises  ankle pumps with LLE elevated x20 reps       Modalities   Modalities  Vasopneumatic      Vasopneumatic   Number Minutes Vasopneumatic   15 minutes    Vasopnuematic Location   Knee Lt    Vasopneumatic Pressure  Medium     Vasopneumatic Temperature   36 deg             PT Education - 09/20/17 0917    Education Details  updated HEP; discussed ice parameters at home    Person(s) Educated  Patient    Methods  Explanation;Handout;Verbal cues    Comprehension  Verbalized understanding;Returned demonstration       PT Short Term Goals - 09/20/17 0941      PT SHORT TERM GOAL #1   Title  Pt will demo understanding of her initial HEP to improve strength and mobility.    Time  2    Period  Weeks    Status  Achieved      PT SHORT TERM GOAL #2   Title  Pt will demo proper mechanics with sit to stand, without significant weight shift to the Rt, which will help improve strength in the LLE.    Time  3    Period  Weeks    Status  New      PT SHORT TERM GOAL #3   Title  Pt will demo improved Lt knee active ROM into 0 deg extension and atleast 115 deg flexion, which will improve her mechanics with every day activity.     Time  3    Period  Weeks    Status  New        PT Long Term Goals - 09/20/17 1142      PT LONG TERM GOAL #1   Title  Pt will demo improved BLE strength to atleast 4/5 MMT which will increase safety with daily activity.     Time  6    Period  Weeks    Status  New    Target Date  11/01/17      PT LONG TERM GOAL #2   Title  Pt will demo improved Lt knee proprioception and strength evident by her ability to complete SLS on the Lt up to 15 sec, 2/3 trials, without LOB.    Time  6    Period  Weeks    Status  New      PT LONG TERM GOAL #3   Title  Pt will complete 5x sit to stand in less than 11 sec, which will reflect a return of her functional strength and endurance.     Time  6    Period  Weeks    Status  New      PT LONG TERM GOAL #4   Title  Pt will report atleast 50% reduction in knee pain with daily activity.    Time  6    Period  Weeks    Status  New      PT LONG TERM GOAL #5   Title  Pt will ambulate atleast 33ft outdoors without her AD, to reflect improvements in  safety and independence during community ambulation.    Time  6    Period  Weeks    Status  New            Plan - 09/20/17 1062    Clinical Impression Statement  Pt arrived today for a re-evaluation following Lt partial TKA on 09/17/17. She currently ambulates with her RW, wearing a compression garment on BLEs. She has limited Lt knee extension, lacking approximately 8 deg, and she has 90 deg of knee flexion. She also demonstrates limited mobility and LLE strength, requiring BUE for support during sit to stand activity. Her performance on 5x sit to stand decreased since her initial evaluation. She would benefit from skilled PT to address post-op swelling, pain and improve her LE strength, ROM and increase her independence with mobility around the home. Pt was instructed in an updated HEP and she demonstrated good understanding at this time.     Rehab Potential  Excellent    PT Frequency  One time visit to be updated after operation    PT Treatment/Interventions  ADLs/Self Care Home Management;Patient/family education;Cryotherapy;Electrical Stimulation;Moist Heat;Therapeutic exercise;Therapeutic activities;Functional mobility training;Stair training;Gait training;Balance training;Neuromuscular re-education;Manual techniques;Passive range of motion;Scar mobilization;Taping;Dry needling    PT Next Visit Plan  address sit to stand mechanics; knee extension and flexion ROM exercise; manual techniques to decrease swelling; game ready end of session    PT Home Exercise Plan  Access Code: [email or web address]; home made ice pack    Consulted and Agree with Plan of Care  Patient       Patient will benefit from skilled therapeutic intervention in order to improve the following deficits and impairments:  Decreased activity tolerance, Impaired flexibility, Decreased strength, Decreased range of motion, Hypomobility, Postural dysfunction, Increased muscle spasms, Pain, Improper body mechanics  Visit Diagnosis: Acute  pain of left knee  Muscle weakness (generalized)  Other abnormalities of gait and mobility  Localized edema     Problem List Patient Active Problem List   Diagnosis Date Noted  . Acute encephalopathy 03/26/2015  . Hypokalemia 03/26/2015  . 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 Arthritis   . Hypertension   . Essential hypertension    11:51 AM,09/20/17 Sherol Dade PT, DPT [place] at [place]  [facility] [address], [place], [place],  [address] Phone: [phone]   Fax:  [phone]  Name: April Randall MRN: [id number] Date of Birth: 16-Jul-1950

## 2017-09-20 NOTE — Patient Instructions (Signed)
Access Code: [email or web address]  URL: [email or web address]  Date: 09/20/2017  Prepared by: Elly Modena   Exercises  Supine Heel Slides - 10 reps - 3x daily - 7x weekly  Ankle Pumps with Compression Garment - 20 reps - 3x daily - 7x weekly  Supine Quad Set - 10 reps - 3x daily - 7x weekly    [place] [address] [place],  [address] Phone # [phone] Fax [phone]

## 2017-09-24 ENCOUNTER — Encounter: Payer: Self-pay | Admitting: Physical Therapy

## 2017-09-24 ENCOUNTER — Ambulatory Visit: Payer: Medicare Other | Attending: Sports Medicine | Admitting: Physical Therapy

## 2017-09-24 DIAGNOSIS — R2689 Other abnormalities of gait and mobility: Secondary | ICD-10-CM | POA: Diagnosis present

## 2017-09-24 DIAGNOSIS — M25562 Pain in left knee: Secondary | ICD-10-CM

## 2017-09-24 DIAGNOSIS — R600 Localized edema: Secondary | ICD-10-CM

## 2017-09-24 DIAGNOSIS — G8929 Other chronic pain: Secondary | ICD-10-CM | POA: Insufficient documentation

## 2017-09-24 DIAGNOSIS — M6281 Muscle weakness (generalized): Secondary | ICD-10-CM | POA: Diagnosis present

## 2017-09-24 NOTE — Therapy (Signed)
[facility] [address], [place], [place], [address] Phone: [phone]   Fax:  [phone]  Physical Therapy Treatment  Patient Details  Name: April Randall MRN: [id number] Date of Birth: 14-Oct-1950 Referring Provider: Corky Mull, MD   Encounter Date: 09/24/2017  PT End of Session - 09/24/17 1416    Visit Number  3    Date for PT Re-Evaluation  11/02/17    Authorization Type  Medicare     Authorization Time Period  09/21/17 to 11/02/17    Authorization - Visit Number  2    Authorization - Number of Visits  10    PT Start Time  1400    PT Stop Time  3299    PT Time Calculation (min)  45 min    Activity Tolerance  Patient tolerated treatment well;No increased pain    Behavior During Therapy  WFL for tasks assessed/performed       Past Medical History:  Diagnosis Date  . Arthritis   . Hypertension     Past Surgical History:  Procedure Laterality Date  . REPLACEMENT TOTAL KNEE Right     There were no vitals filed for this visit.  Subjective Assessment - 09/24/17 1402    Subjective  Pt states that things are going ok. She is still sore in the Lt knee.    Pertinent History  Rt TKA    Currently in Pain?  Yes    Pain Score  4     Pain Location  Knee    Pain Orientation  Left    Pain Descriptors / Indicators  Aching    Pain Type  Surgical pain    Pain Radiating Towards  none     Pain Onset  1 to 4 weeks ago    Pain Frequency  Constant    Aggravating Factors   being up and moving around alot     Pain Relieving Factors  rest, ice     Effect of Pain on Daily Activities  none              [facility] Adult PT Treatment/Exercise - 09/24/17 0001      Knee/Hip Exercises: Seated   Long Arc Quad  Left;1 set;15 reps;AROM    Hamstring Curl  Left;15 reps    Hamstring Limitations  cuing to decrease plantarflexion compensation     Sit to Sand  5 reps;without UE support therapist encouraging proper mechanics       Knee/Hip Exercises: Supine   Quad Sets  10 reps;1 set;Left;Limitations    Quad Sets Limitations  10 sec hold     Heel Slides  Left;1 set;15 reps;AROM    Hip Adduction Isometric  1 set;15 reps;Limitations;Both    Hip Adduction Isometric Limitations  hooklying     Other Supine Knee/Hip Exercises  clams x15 reps each using green TB       Vasopneumatic   Number Minutes Vasopneumatic   15 minutes    Vasopnuematic Location   Knee    Vasopneumatic Pressure  Medium    Vasopneumatic Temperature   37 deg             PT Education - 09/24/17 1435    Education Details  technique with therex     Person(s) Educated  Patient    Methods  Explanation;Verbal cues;Tactile cues    Comprehension  Verbalized understanding;Returned demonstration       PT Short Term Goals - 09/20/17 2426  PT SHORT TERM GOAL #1   Title  Pt will demo understanding of her initial HEP to improve strength and mobility.    Time  2    Period  Weeks    Status  Achieved      PT SHORT TERM GOAL #2   Title  Pt will demo proper mechanics with sit to stand, without significant weight shift to the Rt, which will help improve strength in the LLE.    Time  3    Period  Weeks    Status  New      PT SHORT TERM GOAL #3   Title  Pt will demo improved Lt knee active ROM into 0 deg extension and atleast 115 deg flexion, which will improve her mechanics with every day activity.     Time  3    Period  Weeks    Status  New        PT Long Term Goals - 09/20/17 1142      PT LONG TERM GOAL #1   Title  Pt will demo improved BLE strength to atleast 4/5 MMT which will increase safety with daily activity.     Time  6    Period  Weeks    Status  New    Target Date  11/01/17      PT LONG TERM GOAL #2   Title  Pt will demo improved Lt knee proprioception and strength evident by her ability to complete SLS on the Lt up to 15 sec, 2/3 trials, without LOB.    Time  6    Period  Weeks    Status  New      PT LONG TERM GOAL  #3   Title  Pt will complete 5x sit to stand in less than 11 sec, which will reflect a return of her functional strength and endurance.     Time  6    Period  Weeks    Status  New      PT LONG TERM GOAL #4   Title  Pt will report atleast 50% reduction in knee pain with daily activity.    Time  6    Period  Weeks    Status  New      PT LONG TERM GOAL #5   Title  Pt will ambulate atleast 313ft outdoors without her AD, to reflect improvements in safety and independence during community ambulation.    Time  6    Period  Weeks    Status  New            Plan - 09/24/17 1421    Clinical Impression Statement  Pt has been consistently completing her HEP at home with some knee soreness reported upon arrival to her session. Focused on therex to improve Lt knee AROM and quadriceps strength. Pt was able to complete strengthening therex with some increase in Lt knee soreness, expected for someone at this stage in their recovery. Ended session with gentle massage and modalities to decrease pain and swelling in the Lt knee.     Rehab Potential  Excellent    PT Frequency  One time visit to be updated after operation    PT Treatment/Interventions  ADLs/Self Care Home Management;Patient/family education;Cryotherapy;Electrical Stimulation;Moist Heat;Therapeutic exercise;Therapeutic activities;Functional mobility training;Stair training;Gait training;Balance training;Neuromuscular re-education;Manual techniques;Passive range of motion;Scar mobilization;Taping;Dry needling    PT Next Visit Plan  knee extension and flexion ROM exercise; hip strengthening; manual techniques to decrease swelling; game ready  end of session    PT Home Exercise Plan  Access Code: [email or web address]; home made ice pack    Consulted and Agree with Plan of Care  Patient       Patient will benefit from skilled therapeutic intervention in order to improve the following deficits and impairments:  Decreased activity tolerance, Impaired  flexibility, Decreased strength, Decreased range of motion, Hypomobility, Postural dysfunction, Increased muscle spasms, Pain, Improper body mechanics  Visit Diagnosis: Acute pain of left knee  Muscle weakness (generalized)  Other abnormalities of gait and mobility  Localized edema     Problem List Patient Active Problem List   Diagnosis Date Noted  . Acute encephalopathy 03/26/2015  . Hypokalemia 03/26/2015  . 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 Arthritis   . Hypertension   . Essential hypertension     2:38 PM,09/24/17 Sherol Dade PT, [place] at [place]  [facility] [address] [place], [place], [address] Phone: [phone]   Fax:  [phone]  Name: April Randall MRN: [id number] Date of Birth: 02-12-1951

## 2017-09-26 ENCOUNTER — Encounter: Payer: Self-pay | Admitting: Physical Therapy

## 2017-09-26 ENCOUNTER — Ambulatory Visit: Payer: Medicare Other | Admitting: Physical Therapy

## 2017-09-26 DIAGNOSIS — R2689 Other abnormalities of gait and mobility: Secondary | ICD-10-CM

## 2017-09-26 DIAGNOSIS — M6281 Muscle weakness (generalized): Secondary | ICD-10-CM

## 2017-09-26 DIAGNOSIS — M25562 Pain in left knee: Secondary | ICD-10-CM | POA: Diagnosis not present

## 2017-09-26 DIAGNOSIS — R600 Localized edema: Secondary | ICD-10-CM

## 2017-09-26 NOTE — Patient Instructions (Addendum)
Access Code: [email or web address]  URL: [email or web address]  Date: 09/26/2017  Prepared by: Elly Modena   Exercises  Supine Heel Slides - 10 reps - 3x daily - 7x weekly  Ankle Pumps with Compression Garment - 20 reps - 3x daily - 7x weekly  Supine Quad Set - 10 reps - 3x daily - 7x weekly  Seated Ankle Dorsiflexion Stretch - 10 reps - 3 sets - 1x daily - 7x weekly    [facility] [address], [place], [place] [address] Phone # [phone] Fax [phone]

## 2017-09-26 NOTE — Therapy (Signed)
[facility] [address] [place], [place], [address] Phone: [phone]   Fax:  [phone]  Physical Therapy Treatment  Patient Details  Name: April Randall MRN: [id number] Date of Birth: October 04, 1950 Referring Provider: Corky Mull, MD   Encounter Date: 09/26/2017  PT End of Session - 09/26/17 1130    Visit Number  4    Date for PT Re-Evaluation  11/02/17    Authorization Type  Medicare     Authorization Time Period  09/21/17 to 11/02/17    Authorization - Visit Number  3    Authorization - Number of Visits  10    PT Start Time  4580    PT Stop Time  1155    PT Time Calculation (min)  47 min    Activity Tolerance  Patient tolerated treatment well;No increased pain    Behavior During Therapy  WFL for tasks assessed/performed       Past Medical History:  Diagnosis Date  . Arthritis   . Hypertension     Past Surgical History:  Procedure Laterality Date  . REPLACEMENT TOTAL KNEE Right     There were no vitals filed for this visit.  Subjective Assessment - 09/26/17 1113    Subjective  Pt reports that she is working on her exercises at home. No pain when standing, but it will get sore when doing her exercises.    Pertinent History  Rt TKA    Currently in Pain?  No/denies    Pain Onset  1 to 4 weeks ago         [facility] PT Assessment - 09/26/17 0001      AROM   Left Knee Extension  5    Left Knee Flexion  100                   [facility] Adult PT Treatment/Exercise - 09/26/17 0001      Knee/Hip Exercises: Standing   Other Standing Knee Exercises  weigth shifting Lt/Rt x30 reps, CGA    Other Standing Knee Exercises  tandem rocking with RLE forward and BUE support x30 reps       Knee/Hip Exercises: Seated   Long Arc Quad  Left;1 set;15 reps;AROM    Other Seated Knee/Hip Exercises  Lt knee flexion stretch 10x10 sec hold     Other Seated Knee/Hip Exercises  Lt knee extension stretch x2 min      Knee/Hip Exercises: Supine   Quad Sets  Left;1 set;15 reps;Strengthening    Quad Sets Limitations  3 sec hold     Heel Slides  Left;20 reps;AROM;Limitations    Heel Slides Limitations  last 5 reps with therapist over pressure     Other Supine Knee/Hip Exercises  Lt hip abduction/adduction slide 2x15 reps       Vasopneumatic   Number Minutes Vasopneumatic   15 minutes    Vasopnuematic Location   Knee    Vasopneumatic Pressure  Medium    Vasopneumatic Temperature   37 deg             PT Education - 09/26/17 1147    Education Details  updated HEP    Person(s) Educated  Patient    Methods  Explanation;Handout;Verbal cues    Comprehension  Verbalized understanding       PT Short Term Goals - 09/20/17 0941      PT SHORT TERM GOAL #1   Title  Pt will demo understanding of her initial  HEP to improve strength and mobility.    Time  2    Period  Weeks    Status  Achieved      PT SHORT TERM GOAL #2   Title  Pt will demo proper mechanics with sit to stand, without significant weight shift to the Rt, which will help improve strength in the LLE.    Time  3    Period  Weeks    Status  New      PT SHORT TERM GOAL #3   Title  Pt will demo improved Lt knee active ROM into 0 deg extension and atleast 115 deg flexion, which will improve her mechanics with every day activity.     Time  3    Period  Weeks    Status  New        PT Long Term Goals - 09/20/17 1142      PT LONG TERM GOAL #1   Title  Pt will demo improved BLE strength to atleast 4/5 MMT which will increase safety with daily activity.     Time  6    Period  Weeks    Status  New    Target Date  11/01/17      PT LONG TERM GOAL #2   Title  Pt will demo improved Lt knee proprioception and strength evident by her ability to complete SLS on the Lt up to 15 sec, 2/3 trials, without LOB.    Time  6    Period  Weeks    Status  New      PT LONG TERM GOAL #3   Title  Pt will complete 5x sit to stand in less than 11 sec,  which will reflect a return of her functional strength and endurance.     Time  6    Period  Weeks    Status  New      PT LONG TERM GOAL #4   Title  Pt will report atleast 50% reduction in knee pain with daily activity.    Time  6    Period  Weeks    Status  New      PT LONG TERM GOAL #5   Title  Pt will ambulate atleast 328ft outdoors without her AD, to reflect improvements in safety and independence during community ambulation.    Time  6    Period  Weeks    Status  New            Plan - 09/26/17 1142    Clinical Impression Statement  Pt is making steady progress towards her goals, demonstrating knee flexion AROM to 100 deg this session. Continued with therex to improve ROM and quadriceps strength. Therapist provided active assisted ROM exercises for pt to complete at home and introduced gait training activity as well, to decrease the need for her AD in the future. Ended session without reports of increased Lt knee pain.     Rehab Potential  Excellent    PT Frequency  One time visit to be updated after operation    PT Treatment/Interventions  ADLs/Self Care Home Management;Patient/family education;Cryotherapy;Electrical Stimulation;Moist Heat;Therapeutic exercise;Therapeutic activities;Functional mobility training;Stair training;Gait training;Balance training;Neuromuscular re-education;Manual techniques;Passive range of motion;Scar mobilization;Taping;Dry needling    PT Next Visit Plan  knee extension and flexion ROM exercise; gait training (weight shift, etc.) hip strengthening; manual techniques to decrease swelling; game ready end of session    PT [place]  Consulted and Agree with Plan of Care  Patient       Patient will benefit from skilled therapeutic intervention in order to improve the following deficits and impairments:  Decreased activity tolerance, Impaired flexibility, Decreased strength, Decreased range of motion, Hypomobility, Postural  dysfunction, Increased muscle spasms, Pain, Improper body mechanics  Visit Diagnosis: Acute pain of left knee  Muscle weakness (generalized)  Other abnormalities of gait and mobility  Localized edema     Problem List Patient Active Problem List   Diagnosis Date Noted  . Acute encephalopathy 03/26/2015  . Hypokalemia 03/26/2015  . 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 Arthritis   . Hypertension   . Essential hypertension     Sherol Dade 09/26/2017, 12:52 PM  [place] [address], [place], [place], [address] Phone: [phone]   Fax:  [phone]  Name: April Randall MRN: [id number] Date of Birth: [birth year]

## 2017-10-01 ENCOUNTER — Telehealth: Payer: Self-pay | Admitting: Physical Therapy

## 2017-10-01 ENCOUNTER — Ambulatory Visit: Payer: Medicare Other

## 2017-10-01 DIAGNOSIS — M25562 Pain in left knee: Secondary | ICD-10-CM | POA: Diagnosis not present

## 2017-10-01 DIAGNOSIS — R2689 Other abnormalities of gait and mobility: Secondary | ICD-10-CM

## 2017-10-01 DIAGNOSIS — M6281 Muscle weakness (generalized): Secondary | ICD-10-CM

## 2017-10-01 DIAGNOSIS — R600 Localized edema: Secondary | ICD-10-CM

## 2017-10-01 NOTE — Telephone Encounter (Signed)
No show. Attempted to call pt regarding missed appointment.  9:06 AM,10/01/17 [place], [place] at [place]

## 2017-10-01 NOTE — Therapy (Signed)
[facility] [address] [place], [place], [address] Phone: [phone]   Fax:  [phone]  Physical Therapy Treatment  Patient Details  Name: April Randall MRN: [id number] Date of Birth: [birth year] Referring Provider: Corky Mull, MD   Encounter Date: 10/01/2017  PT End of Session - 10/01/17 0950    Visit Number  6    Date for PT Re-Evaluation  11/02/17    Authorization Type  Medicare     Authorization Time Period  09/21/17 to 11/02/17    PT Start Time  0915    PT Stop Time  1015    PT Time Calculation (min)  60 min    Activity Tolerance  Patient tolerated treatment well;No increased pain    Behavior During Therapy  WFL for tasks assessed/performed       Past Medical History:  Diagnosis Date  . Arthritis   . Hypertension     Past Surgical History:  Procedure Laterality Date  . REPLACEMENT TOTAL KNEE Right     There were no vitals filed for this visit.  Subjective Assessment - 10/01/17 0914    Subjective  Pt reports that things are going well. She is doing her exercises a "little bit". She has some mild pain right now.     Pertinent History  Rt TKA    Pain Score  3     Pain Location  Knee    Pain Orientation  Left;Lateral    Pain Descriptors / Indicators  Aching    Pain Type  Surgical pain    Pain Radiating Towards  none     Pain Onset  1 to 4 weeks ago    Pain Frequency  Constant    Aggravating Factors   up and moving alot     Pain Relieving Factors  rest, ice     Effect of Pain on Daily Activities  none          [facility] PT Assessment - 10/01/17 0001      Assessment   Medical Diagnosis  preoperative treatment of Lt knee      Cognition   Overall Cognitive Status  Within Functional Limits for tasks assessed      Observation/Other Assessments   Observations  Pt wearing compression garment       AROM   Left Knee Extension  5    Left Knee Flexion  105      Ambulation/Gait   Gait Comments   Pt ambulating with RW, decreased stance on Lt                   [facility] Adult PT Treatment/Exercise - 10/01/17 0001      Knee/Hip Exercises: Aerobic   Nustep  Level 1 x 8 minutes      Knee/Hip Exercises: Seated   Long Arc Quad  Left;15 reps;AROM;2 sets    Hamstring Curl  Left;15 reps    Hamstring Limitations  red theraband    Sit to Sand  2 sets;10 reps;without UE support      Knee/Hip Exercises: Supine   Quad Sets  Left;1 set;15 reps    Quad Sets Limitations  3 sec hold     Short Arc Quad Sets  Left;1 set;15 reps;AROM    Heel Slides  Left;20 reps;AROM;Limitations    Heel Slides Limitations  last 5 reps with therapist over pressure     Hip Adduction Isometric  Both;1 set;15 reps    Hip Adduction Isometric Limitations  hooklying    Straight Leg Raises  Left;1 set;10 reps    Other Supine Knee/Hip Exercises  Lt hip abduction/adduction slide 2x15 reps       Knee/Hip Exercises: Sidelying   Clams  x10 reps on Lt, cuing to decrease trunk rotation       Vasopneumatic   Number Minutes Vasopneumatic   15 minutes    Vasopnuematic Location   Knee    Vasopneumatic Pressure  Medium    Vasopneumatic Temperature   37 deg               PT Short Term Goals - 09/20/17 0941      PT SHORT TERM GOAL #1   Title  Pt will demo understanding of her initial HEP to improve strength and mobility.    Time  2    Period  Weeks    Status  Achieved      PT SHORT TERM GOAL #2   Title  Pt will demo proper mechanics with sit to stand, without significant weight shift to the Rt, which will help improve strength in the LLE.    Time  3    Period  Weeks    Status  New      PT SHORT TERM GOAL #3   Title  Pt will demo improved Lt knee active ROM into 0 deg extension and atleast 115 deg flexion, which will improve her mechanics with every day activity.     Time  3    Period  Weeks    Status  New        PT Long Term Goals - 09/20/17 1142      PT LONG TERM GOAL #1   Title  Pt will  demo improved BLE strength to atleast 4/5 MMT which will increase safety with daily activity.     Time  6    Period  Weeks    Status  New    Target Date  11/01/17      PT LONG TERM GOAL #2   Title  Pt will demo improved Lt knee proprioception and strength evident by her ability to complete SLS on the Lt up to 15 sec, 2/3 trials, without LOB.    Time  6    Period  Weeks    Status  New      PT LONG TERM GOAL #3   Title  Pt will complete 5x sit to stand in less than 11 sec, which will reflect a return of her functional strength and endurance.     Time  6    Period  Weeks    Status  New      PT LONG TERM GOAL #4   Title  Pt will report atleast 50% reduction in knee pain with daily activity.    Time  6    Period  Weeks    Status  New      PT LONG TERM GOAL #5   Title  Pt will ambulate atleast 354ft outdoors without her AD, to reflect improvements in safety and independence during community ambulation.    Time  6    Period  Weeks    Status  New            Plan - 10/01/17 0935    Clinical Impression Statement  Pt is making steady progress 2 weeks s/p Lt TKA.  Pt continues to ambulate with a rolling walker for all distances.  Pt with Lt knee A/ROM  to 105 degrees today.  Pt with edema and weakness/stiffness expected s/p TKA.  Pt will continue to benefit from skilled PT for Lt knee strength, flexibility, edema management and gait training.      Rehab Potential  Excellent    PT Treatment/Interventions  ADLs/Self Care Home Management;Patient/family education;Cryotherapy;Electrical Stimulation;Moist Heat;Therapeutic exercise;Therapeutic activities;Functional mobility training;Stair training;Gait training;Balance training;Neuromuscular re-education;Manual techniques;Passive range of motion;Scar mobilization;Taping;Dry needling    PT Next Visit Plan  knee extension and flexion ROM exercise; gait training (weight shift, etc.) hip strengthening; manual techniques to decrease swelling; game  ready end of session    PT Home Exercise Plan  [id number]     Consulted and Agree with Plan of Care  Patient       Patient will benefit from skilled therapeutic intervention in order to improve the following deficits and impairments:  Decreased activity tolerance, Impaired flexibility, Decreased strength, Decreased range of motion, Hypomobility, Postural dysfunction, Increased muscle spasms, Pain, Improper body mechanics  Visit Diagnosis: Acute pain of left knee  Muscle weakness (generalized)  Other abnormalities of gait and mobility  Localized edema     Problem List Patient Active Problem List   Diagnosis Date Noted  . Acute encephalopathy 03/26/2015  . Hypokalemia 03/26/2015  . 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 Arthritis   . Hypertension   . Essential hypertension      Sigurd Sos, PT 10/01/17 10:03 AM   [facility] [address] [place], [place], [address] Phone: [phone]   Fax:  [phone]  Name: April Randall MRN: [id number] Date of Birth: 1950/06/29

## 2017-10-03 ENCOUNTER — Ambulatory Visit: Payer: Medicare Other | Admitting: Physical Therapy

## 2017-10-03 DIAGNOSIS — M25562 Pain in left knee: Secondary | ICD-10-CM | POA: Diagnosis not present

## 2017-10-03 DIAGNOSIS — R2689 Other abnormalities of gait and mobility: Secondary | ICD-10-CM

## 2017-10-03 DIAGNOSIS — R600 Localized edema: Secondary | ICD-10-CM

## 2017-10-03 DIAGNOSIS — M6281 Muscle weakness (generalized): Secondary | ICD-10-CM

## 2017-10-03 NOTE — Therapy (Signed)
[facility] [address] [place], [place], [address] Phone: [phone]   Fax:  [phone]  Physical Therapy Treatment  Patient Details  Name: April Randall MRN: [id number] Date of Birth: [birth year] Referring Provider: Corky Mull, MD   Encounter Date: 10/03/2017  PT End of Session - 10/03/17 0916    Visit Number  6    Date for PT Re-Evaluation  11/02/17    Authorization Type  Medicare     Authorization Time Period  09/21/17 to 11/02/17    Authorization - Visit Number  5    Authorization - Number of Visits  10    PT Start Time  4193 Pt arrived late to appointment     PT Stop Time  0940    PT Time Calculation (min)  47 min    Activity Tolerance  Patient tolerated treatment well;No increased pain    Behavior During Therapy  WFL for tasks assessed/performed       Past Medical History:  Diagnosis Date  . Arthritis   . Hypertension     Past Surgical History:  Procedure Laterality Date  . REPLACEMENT TOTAL KNEE Right     There were no vitals filed for this visit.  Subjective Assessment - 10/03/17 0855    Subjective  Pt reports that she is doing great. She was able to take some steps without her walker this morning.     Pertinent History  Rt TKA    Currently in Pain?  No/denies    Pain Onset  1 to 4 weeks ago         [facility] PT Assessment - 10/03/17 0001      AROM   Left Knee Flexion  110                   [facility] Adult PT Treatment/Exercise - 10/03/17 0001      Ambulation/Gait   Ambulation/Gait  Yes    Ambulation/Gait Assistance  5: Supervision    Ambulation Distance (Feet)  150 Feet    Assistive device  Straight cane    Gait Comments  Pt ambulating with SPC, decreased step length on Rt, decreased push off on Lt with therapist verbal cuing to improve symmetry between Lt and Rt      Knee/Hip Exercises: Standing   Heel Raises  Both;1 set;20 reps;Limitations    Heel Raises Limitations   weight shift Lt      Knee/Hip Exercises: Seated   Long Arc Quad  1 set;Left;15 reps    Other Seated Knee/Hip Exercises  clams with green TB x15 reps       Knee/Hip Exercises: Supine   Heel Slides  Left;1 set;10 reps;AAROM    Heel Slides Limitations  with strap       Knee/Hip Exercises: Sidelying   Clams  x10 reps on Lt, cuing to decrease trunk rotation       Vasopneumatic   Number Minutes Vasopneumatic   15 minutes    Vasopnuematic Location   Knee    Vasopneumatic Pressure  Medium    Vasopneumatic Temperature   37 deg      Manual Therapy   Manual Therapy  Soft tissue mobilization    Soft tissue mobilization  scar massage along surgical incision             PT Education - 10/03/17 1018    Education Details  using SPC at home    Person(s) Educated  Patient  Methods  Explanation    Comprehension  Verbalized understanding       PT Short Term Goals - 10/03/17 8242      PT SHORT TERM GOAL #1   Title  Pt will demo understanding of her initial HEP to improve strength and mobility.    Time  2    Period  Weeks    Status  Achieved      PT SHORT TERM GOAL #2   Title  Pt will demo proper mechanics with sit to stand, without significant weight shift to the Rt, which will help improve strength in the LLE.    Time  3    Period  Weeks    Status  New      PT SHORT TERM GOAL #3   Title  Pt will demo improved Lt knee active ROM into 0 deg extension and atleast 115 deg flexion, which will improve her mechanics with every day activity.     Baseline  110 flexion    Time  3    Period  Weeks    Status  On-going        PT Long Term Goals - 09/20/17 1142      PT LONG TERM GOAL #1   Title  Pt will demo improved BLE strength to atleast 4/5 MMT which will increase safety with daily activity.     Time  6    Period  Weeks    Status  New    Target Date  11/01/17      PT LONG TERM GOAL #2   Title  Pt will demo improved Lt knee proprioception and strength evident by her  ability to complete SLS on the Lt up to 15 sec, 2/3 trials, without LOB.    Time  6    Period  Weeks    Status  New      PT LONG TERM GOAL #3   Title  Pt will complete 5x sit to stand in less than 11 sec, which will reflect a return of her functional strength and endurance.     Time  6    Period  Weeks    Status  New      PT LONG TERM GOAL #4   Title  Pt will report atleast 50% reduction in knee pain with daily activity.    Time  6    Period  Weeks    Status  New      PT LONG TERM GOAL #5   Title  Pt will ambulate atleast 34ft outdoors without her AD, to reflect improvements in safety and independence during community ambulation.    Time  6    Period  Weeks    Status  New            Plan - 10/03/17 0926    Clinical Impression Statement  Pt continues to work on HEP at home, demonstrating improved Lt knee flexion AROM to 110 deg this session. Pt ambulated with [facility] and supervision from the therapist. She required initial cuing to improve heel/toe sequencing but demonstrated no signs of unsteadiness with this. Encouraged pt to use her SPC at home moving forward and she verbalized understanding of this. Will continue with current POC.    Rehab Potential  Excellent    PT Treatment/Interventions  ADLs/Self Care Home Management;Patient/family education;Cryotherapy;Electrical Stimulation;Moist Heat;Therapeutic exercise;Therapeutic activities;Functional mobility training;Stair training;Gait training;Balance training;Neuromuscular re-education;Manual techniques;Passive range of motion;Scar mobilization;Taping;Dry needling    PT Next Visit Plan  knee extension and flexion ROM exercise; gait training (weight shift, etc.) hip strengthening; manual techniques to decrease swelling; game ready end of session    PT Home Exercise Plan  [id number]     Consulted and Agree with Plan of Care  Patient       Patient will benefit from skilled therapeutic intervention in order to improve the following  deficits and impairments:  Decreased activity tolerance, Impaired flexibility, Decreased strength, Decreased range of motion, Hypomobility, Postural dysfunction, Increased muscle spasms, Pain, Improper body mechanics  Visit Diagnosis: Acute pain of left knee  Muscle weakness (generalized)  Other abnormalities of gait and mobility  Localized edema     Problem List Patient Active Problem List   Diagnosis Date Noted  . Acute encephalopathy 03/26/2015  . Hypokalemia 03/26/2015  . 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 Arthritis   . Hypertension   . Essential hypertension     10:19 AM,10/03/17 Sherol Dade PT, [place] [facility] [place]  [facility] [address] [place], [place], [address] Phone: [phone]   Fax:  [phone]  Name: April Randall MRN: [id number] Date of Birth: [birth year]

## 2017-10-08 ENCOUNTER — Ambulatory Visit: Payer: Medicare Other | Admitting: Physical Therapy

## 2017-10-08 ENCOUNTER — Encounter: Payer: Self-pay | Admitting: Physical Therapy

## 2017-10-08 DIAGNOSIS — R2689 Other abnormalities of gait and mobility: Secondary | ICD-10-CM

## 2017-10-08 DIAGNOSIS — G8929 Other chronic pain: Secondary | ICD-10-CM

## 2017-10-08 DIAGNOSIS — M6281 Muscle weakness (generalized): Secondary | ICD-10-CM

## 2017-10-08 DIAGNOSIS — M25562 Pain in left knee: Secondary | ICD-10-CM

## 2017-10-08 DIAGNOSIS — R600 Localized edema: Secondary | ICD-10-CM

## 2017-10-08 NOTE — Therapy (Signed)
[facility] [address] [place], [place], [address] Phone: [phone]   Fax:  [phone]  Physical Therapy Treatment  Patient Details  Name: Cheyne Bungert MRN: [id number] Date of Birth: 12/13/1950 Referring Provider: Corky Mull, MD   Encounter Date: 10/08/2017  PT End of Session - 10/08/17 0912    Visit Number  7    Date for PT Re-Evaluation  11/02/17    Authorization Type  Medicare     Authorization Time Period  09/21/17 to 11/02/17    Authorization - Visit Number  6    Authorization - Number of Visits  10    PT Start Time  0277    PT Stop Time  4128    PT Time Calculation (min)  62 min    Activity Tolerance  Patient tolerated treatment well;No increased pain    Behavior During Therapy  WFL for tasks assessed/performed       Past Medical History:  Diagnosis Date  . Arthritis   . Hypertension     Past Surgical History:  Procedure Laterality Date  . REPLACEMENT TOTAL KNEE Right     There were no vitals filed for this visit.  Subjective Assessment - 10/08/17 0919    Subjective  Pt reports doing well.  States the compression tights are bothering her.  She was able to transfer and take a few steps without the walker today.    Pertinent History  Rt TKA    Currently in Pain?  No/denies         [facility] PT Assessment - 10/08/17 0001      AROM   Left Knee Flexion  114                   [facility] Adult PT Treatment/Exercise - 10/08/17 0001      Knee/Hip Exercises: Stretches   Hip Flexor Stretch  3 reps;20 seconds;Left    Knee: Self-Stretch to increase Flexion  5 reps;10 seconds      Knee/Hip Exercises: Aerobic   Nustep  Level 1 x 8 minutes PT present to get update      Knee/Hip Exercises: Standing   Heel Raises  Both;1 set;20 reps;Limitations    Hip Flexion  Stengthening;Both;20 reps;Knee bent marching in place    Hip Abduction  Stengthening;Right;Left;10 reps;Knee straight cues to keep  hips level    Forward Step Up  Left;10 reps;Hand Hold: 0;Step Height: 2"      Knee/Hip Exercises: Seated   Long Arc Quad  1 set;Left;15 reps;Weights    Long Arc Quad Weight  -- 1.5 lb    Hamstring Curl  Left;15 reps    Hamstring Limitations  red theraband    Sit to [employer]  2 sets;10 reps;without UE support      Knee/Hip Exercises: Supine   Other Supine Knee/Hip Exercises  clams x40 reps each using red TB       Vasopneumatic   Number Minutes Vasopneumatic   15 minutes    Vasopnuematic Location   Knee    Vasopneumatic Pressure  Medium    Vasopneumatic Temperature   37 deg               PT Short Term Goals - 10/03/17 7867      PT SHORT TERM GOAL #1   Title  Pt will demo understanding of her initial HEP to improve strength and mobility.    Time  2    Period  Weeks  Status  Achieved      PT SHORT TERM GOAL #2   Title  Pt will demo proper mechanics with sit to stand, without significant weight shift to the Rt, which will help improve strength in the LLE.    Time  3    Period  Weeks    Status  New      PT SHORT TERM GOAL #3   Title  Pt will demo improved Lt knee active ROM into 0 deg extension and atleast 115 deg flexion, which will improve her mechanics with every day activity.     Baseline  110 flexion    Time  3    Period  Weeks    Status  On-going        PT Long Term Goals - 09/20/17 1142      PT LONG TERM GOAL #1   Title  Pt will demo improved BLE strength to atleast 4/5 MMT which will increase safety with daily activity.     Time  6    Period  Weeks    Status  New    Target Date  11/01/17      PT LONG TERM GOAL #2   Title  Pt will demo improved Lt knee proprioception and strength evident by her ability to complete SLS on the Lt up to 15 sec, 2/3 trials, without LOB.    Time  6    Period  Weeks    Status  New      PT LONG TERM GOAL #3   Title  Pt will complete 5x sit to stand in less than 11 sec, which will reflect a return of her functional strength  and endurance.     Time  6    Period  Weeks    Status  New      PT LONG TERM GOAL #4   Title  Pt will report atleast 50% reduction in knee pain with daily activity.    Time  6    Period  Weeks    Status  New      PT LONG TERM GOAL #5   Title  Pt will ambulate atleast 32ft outdoors without her AD, to reflect improvements in safety and independence during community ambulation.    Time  6    Period  Weeks    Status  New            Plan - 10/08/17 1001    Clinical Impression Statement  Pt was able to achieve 114 deg Lt knee flexion.  Pt was able to progress strength exercises on Lt LE.  She demonstrates left hip and quad weakness with tendency to lean to the left when doing single leg exercises.  Pt also has tight hip flexors with reduced hip extension in walking and needs cues for stretching.  Pt will continue with POC.     PT Treatment/Interventions  ADLs/Self Care Home Management;Patient/family education;Cryotherapy;Electrical Stimulation;Moist Heat;Therapeutic exercise;Therapeutic activities;Functional mobility training;Stair training;Gait training;Balance training;Neuromuscular re-education;Manual techniques;Passive range of motion;Scar mobilization;Taping;Dry needling    PT Next Visit Plan  knee extension and flexion ROM exercise; gait training (weight shift, etc.) hip strengthening; manual techniques to decrease swelling; game ready end of session    PT [place]     Consulted and Agree with Plan of Care  Patient       Patient will benefit from skilled therapeutic intervention in order to improve the following deficits and impairments:  Decreased activity  tolerance, Impaired flexibility, Decreased strength, Decreased range of motion, Hypomobility, Postural dysfunction, Increased muscle spasms, Pain, Improper body mechanics  Visit Diagnosis: Acute pain of left knee  Muscle weakness (generalized)  Other abnormalities of gait and mobility  Localized  edema  Chronic pain of left knee     Problem List Patient Active Problem List   Diagnosis Date Noted  . Acute encephalopathy 03/26/2015  . Hypokalemia 03/26/2015  . 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 Arthritis   . Hypertension   . Essential hypertension     Zannie Cove, PT 10/08/2017, 10:07 AM  [facility] [address], [place], [place], [address] Phone: [phone]   Fax:  [phone]  Name: Tanija Germani MRN: [id number] Date of Birth: 03-08-1951

## 2017-10-10 ENCOUNTER — Ambulatory Visit: Payer: Medicare Other

## 2017-10-10 DIAGNOSIS — M25562 Pain in left knee: Secondary | ICD-10-CM | POA: Diagnosis not present

## 2017-10-10 DIAGNOSIS — R2689 Other abnormalities of gait and mobility: Secondary | ICD-10-CM

## 2017-10-10 DIAGNOSIS — R600 Localized edema: Secondary | ICD-10-CM

## 2017-10-10 DIAGNOSIS — M6281 Muscle weakness (generalized): Secondary | ICD-10-CM

## 2017-10-10 NOTE — Therapy (Signed)
[facility] [address] [employer] [place], [place], [address] Phone: [phone]   Fax:  [phone]  Physical Therapy Treatment  Patient Details  Name: April Randall MRN: [id number] Date of Birth: 04/15/1950 Referring Provider: Corky Mull, MD   Encounter Date: 10/10/2017  PT End of Session - 10/10/17 0923    Visit Number  8    Date for PT Re-Evaluation  11/02/17    Authorization Type  Medicare     Authorization Time Period  09/21/17 to 11/02/17    PT Start Time  0845    PT Stop Time  0938    PT Time Calculation (min)  53 min    Activity Tolerance  Patient tolerated treatment well;No increased pain    Behavior During Therapy  WFL for tasks assessed/performed       Past Medical History:  Diagnosis Date  . Arthritis   . Hypertension     Past Surgical History:  Procedure Laterality Date  . REPLACEMENT TOTAL KNEE Right     There were no vitals filed for this visit.  Subjective Assessment - 10/10/17 0857    Subjective  Pt is doing well and now walking with a cane.      Pertinent History  Rt TKA    Currently in Pain?  No/denies         [facility] PT Assessment - 10/10/17 0001      AROM   Left Knee Extension  4                   [facility] Adult PT Treatment/Exercise - 10/10/17 0001      Knee/Hip Exercises: Aerobic   Nustep  Level 1 x 10 minutes PT present to get update      Knee/Hip Exercises: Standing   Hip Abduction  Stengthening;Right;Left;Knee straight;20 reps cues to keep hips level    Hip Extension  Stengthening;Both;20 reps      Knee/Hip Exercises: Seated   Long Arc Quad  Left;Weights;2 sets;10 reps    Long Arc Quad Weight  2 lbs.      Knee/Hip Exercises: Supine   Heel Slides  Left;1 set;10 reps;AAROM    Heel Slides Limitations  with strap     Straight Leg Raises  Left;2 sets;10 reps empahsis on quad contraction      Vasopneumatic   Number Minutes Vasopneumatic   15 minutes    Vasopnuematic Location   Knee    Vasopneumatic Pressure  Medium    Vasopneumatic Temperature   37 deg               PT Short Term Goals - 10/10/17 0859      PT SHORT TERM GOAL #2   Title  Pt will demo proper mechanics with sit to stand, without significant weight shift to the Rt, which will help improve strength in the LLE.    Status  Achieved      PT SHORT TERM GOAL #3   Title  Pt will demo improved Lt knee active ROM into 0 deg extension and atleast 115 deg flexion, which will improve her mechanics with every day activity.     Baseline  114 flexion, 4 degrees extension    Time  3    Period  Weeks    Status  On-going        PT Long Term Goals - 09/20/17 1142      PT LONG TERM GOAL #1   Title  Pt will  demo improved BLE strength to atleast 4/5 MMT which will increase safety with daily activity.     Time  6    Period  Weeks    Status  New    Target Date  11/01/17      PT LONG TERM GOAL #2   Title  Pt will demo improved Lt knee proprioception and strength evident by her ability to complete SLS on the Lt up to 15 sec, 2/3 trials, without LOB.    Time  6    Period  Weeks    Status  New      PT LONG TERM GOAL #3   Title  Pt will complete 5x sit to stand in less than 11 sec, which will reflect a return of her functional strength and endurance.     Time  6    Period  Weeks    Status  New      PT LONG TERM GOAL #4   Title  Pt will report atleast 50% reduction in knee pain with daily activity.    Time  6    Period  Weeks    Status  New      PT LONG TERM GOAL #5   Title  Pt will ambulate atleast 342ft outdoors without her AD, to reflect improvements in safety and independence during community ambulation.    Time  6    Period  Weeks    Status  New            Plan - 10/10/17 0905    Clinical Impression Statement  Pt is making steady progress s/p Lt TKA.  Pt is now ambulating with a large base quad cane and demonstrates minimal antalgia.  Pt with Lt quad  weakness and has difficulty with Rt=Lt weightbearing with standing.  Pt requires verbal and tactile cues to correct.  Pt able to tolerate ankle weight with long arc quads today.  Pt will continue to benefit from skilled PT for Lt knee strength, flexibility and gait/proprioception.      Rehab Potential  Excellent    PT Frequency  2x / week    PT Duration  8 weeks    PT Treatment/Interventions  ADLs/Self Care Home Management;Patient/family education;Cryotherapy;Electrical Stimulation;Moist Heat;Therapeutic exercise;Therapeutic activities;Functional mobility training;Stair training;Gait training;Balance training;Neuromuscular re-education;Manual techniques;Passive range of motion;Scar mobilization;Taping;Dry needling    PT Next Visit Plan  knee extension and flexion ROM exercise; gait training (weight shift, etc.) hip strengthening; manual techniques to decrease swelling; game ready end of session    PT Home Exercise Plan  [facility]     Recommended Other Services  initial certification is signed    Consulted and Agree with Plan of Care  Patient       Patient will benefit from skilled therapeutic intervention in order to improve the following deficits and impairments:  Decreased activity tolerance, Impaired flexibility, Decreased strength, Decreased range of motion, Hypomobility, Postural dysfunction, Increased muscle spasms, Pain, Improper body mechanics  Visit Diagnosis: Acute pain of left knee  Muscle weakness (generalized)  Other abnormalities of gait and mobility  Localized edema     Problem List Patient Active Problem List   Diagnosis Date Noted  . Acute encephalopathy 03/26/2015  . Hypokalemia 03/26/2015  . 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 Arthritis   . Hypertension   . Essential hypertension      Sigurd Sos, PT 10/10/17 9:24 AM   [facility] [address], [place], [place], [address]  Phone: [phone]    Fax:  [phone]  Name: [name] MRN: [id number] Date of Birth: Apr 26, 1950

## 2017-10-15 ENCOUNTER — Encounter: Payer: Self-pay | Admitting: Physical Therapy

## 2017-10-16 ENCOUNTER — Ambulatory Visit: Payer: Medicare Other | Admitting: Physical Therapy

## 2017-10-16 ENCOUNTER — Encounter: Payer: Self-pay | Admitting: Physical Therapy

## 2017-10-16 DIAGNOSIS — R600 Localized edema: Secondary | ICD-10-CM

## 2017-10-16 DIAGNOSIS — M6281 Muscle weakness (generalized): Secondary | ICD-10-CM

## 2017-10-16 DIAGNOSIS — M25562 Pain in left knee: Secondary | ICD-10-CM

## 2017-10-16 DIAGNOSIS — R2689 Other abnormalities of gait and mobility: Secondary | ICD-10-CM

## 2017-10-16 NOTE — Therapy (Signed)
[facility] [address], [place], [place], [address] Phone: [phone]   Fax:  [phone]  Physical Therapy Treatment  Patient Details  Name: April Randall MRN: [id number] Date of Birth: [birth year] Referring Provider: Corky Mull, MD   Encounter Date: 10/16/2017  PT End of Session - 10/16/17 0939    Visit Number  9    Date for PT Re-Evaluation  11/02/17    Authorization Type  Medicare     Authorization Time Period  09/21/17 to 11/02/17    PT Start Time  0930    PT Stop Time  1025    PT Time Calculation (min)  55 min    Activity Tolerance  Patient tolerated treatment well;No increased pain    Behavior During Therapy  WFL for tasks assessed/performed       Past Medical History:  Diagnosis Date  . Arthritis   . Hypertension     Past Surgical History:  Procedure Laterality Date  . REPLACEMENT TOTAL KNEE Right     There were no vitals filed for this visit.  Subjective Assessment - 10/16/17 0934    Subjective  Pt states that things are going well. She is using her cane and sometimes not using it. She takes her time when walking without her cane. No pain currently, but it was a little sore yesterday after doing alot of activity.     Pertinent History  Rt TKA    Currently in Pain?  No/denies         [facility] PT Assessment - 10/16/17 0001      AROM   Left Knee Flexion  115          [facility] Adult PT Treatment/Exercise - 10/16/17 0001      Knee/Hip Exercises: Aerobic   Nustep  L3 x3 min, PT present to discuss progress       Knee/Hip Exercises: Standing   Heel Raises  Both;20 reps    Heel Raises Limitations  BUE support       Knee/Hip Exercises: Seated   Long Arc Quad  Left;2 sets;15 reps    Long Arc Quad Weight  4 lbs.    Sit to [employer]  10 reps;Other (comment) therapist cuing to decrease weight shift Rt       Knee/Hip Exercises: Supine   Heel Slides  Left;1 set;AAROM;10 reps    Heel Slides  Limitations  with strap     Bridges  Limitations    Bridges Limitations  attempted but unable without hamstring cramp, x2 reps     Other Supine Knee/Hip Exercises  Lt straight leg hip extension isometric into table x10 reps       Vasopneumatic   Number Minutes Vasopneumatic   15 minutes    Vasopnuematic Location   Knee Lt     Vasopneumatic Pressure  Medium    Vasopneumatic Temperature   37 deg      Manual Therapy   Soft tissue mobilization  Lt scar mobilization to decrease scar adhesions             PT Education - 10/16/17 1006    Education Details  Technique with therex; encouraged pt to avoid bunching of her compression garments    Person(s) Educated  Patient    Methods  Explanation;Verbal cues    Comprehension  Returned demonstration;Verbalized understanding       PT Short Term Goals - 10/16/17 0940      PT SHORT TERM GOAL #1  Title  Pt will demo understanding of her initial HEP to improve strength and mobility.    Time  2    Period  Weeks    Status  Achieved      PT SHORT TERM GOAL #2   Title  Pt will demo proper mechanics with sit to stand, without significant weight shift to the Rt, which will help improve strength in the LLE.    Baseline  requires verbal cuing     Time  3    Period  Weeks    Status  Partially Met      PT SHORT TERM GOAL #3   Title  Pt will demo improved Lt knee active ROM into 0 deg extension and atleast 115 deg flexion, which will improve her mechanics with every day activity.     Baseline  110 flexion    Time  3    Period  Weeks    Status  On-going        PT Long Term Goals - 09/20/17 1142      PT LONG TERM GOAL #1   Title  Pt will demo improved BLE strength to atleast 4/5 MMT which will increase safety with daily activity.     Time  6    Period  Weeks    Status  New    Target Date  11/01/17      PT LONG TERM GOAL #2   Title  Pt will demo improved Lt knee proprioception and strength evident by her ability to complete SLS on  the Lt up to 15 sec, 2/3 trials, without LOB.    Time  6    Period  Weeks    Status  New      PT LONG TERM GOAL #3   Title  Pt will complete 5x sit to stand in less than 11 sec, which will reflect a return of her functional strength and endurance.     Time  6    Period  Weeks    Status  New      PT LONG TERM GOAL #4   Title  Pt will report atleast 50% reduction in knee pain with daily activity.    Time  6    Period  Weeks    Status  New      PT LONG TERM GOAL #5   Title  Pt will ambulate atleast 375f outdoors without her AD, to reflect improvements in safety and independence during community ambulation.    Time  6    Period  Weeks    Status  New            Plan - 10/16/17 06599   Clinical Impression Statement  Continued this session with therex to progress Lt knee strength and ROM. Pt continues to have about 115 deg of Lt knee flexion. She requires therapist cuing to improve mechanics with sit to stand, demonstrating increased weight shift onto the Rt side during this activity. Pt was able to complete functional strengthening with step ups this session. Therapist completed scar massage to decrease palpable adhesions and pt noted no increase in pain end of today's session.     Rehab Potential  Excellent    PT Frequency  2x / week    PT Duration  8 weeks    PT Treatment/Interventions  ADLs/Self Care Home Management;Patient/family education;Cryotherapy;Electrical Stimulation;Moist Heat;Therapeutic exercise;Therapeutic activities;Functional mobility training;Stair training;Gait training;Balance training;Neuromuscular re-education;Manual techniques;Passive range of motion;Scar mobilization;Taping;Dry needling  PT Next Visit Plan  gait without SPC; begin balance activity; gentle functional strengthening; hamstring strength    PT Home Exercise Plan  [id number]     Consulted and Agree with Plan of Care  Patient       Patient will benefit from skilled therapeutic intervention in  order to improve the following deficits and impairments:  Decreased activity tolerance, Impaired flexibility, Decreased strength, Decreased range of motion, Hypomobility, Postural dysfunction, Increased muscle spasms, Pain, Improper body mechanics  Visit Diagnosis: Acute pain of left knee  Muscle weakness (generalized)  Other abnormalities of gait and mobility  Localized edema     Problem List Patient Active Problem List   Diagnosis Date Noted  . Acute encephalopathy 03/26/2015  . Hypokalemia 03/26/2015  . 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 Arthritis   . Hypertension   . Essential hypertension    10:37 AM,10/16/17 Sherol Dade PT, [place] [facility] [place]  [facility] [address] [place], [place], [address] Phone: [phone]   Fax:  [phone]  Name: Krizia Flight MRN: [id number] Date of Birth: 1950-07-27

## 2017-10-17 ENCOUNTER — Encounter: Payer: Self-pay | Admitting: Physical Therapy

## 2017-10-18 ENCOUNTER — Encounter: Payer: Self-pay | Admitting: Physical Therapy

## 2017-10-18 ENCOUNTER — Ambulatory Visit: Payer: Medicare Other | Admitting: Physical Therapy

## 2017-10-18 DIAGNOSIS — M25562 Pain in left knee: Secondary | ICD-10-CM

## 2017-10-18 DIAGNOSIS — R2689 Other abnormalities of gait and mobility: Secondary | ICD-10-CM

## 2017-10-18 DIAGNOSIS — M6281 Muscle weakness (generalized): Secondary | ICD-10-CM

## 2017-10-18 DIAGNOSIS — R600 Localized edema: Secondary | ICD-10-CM

## 2017-10-18 NOTE — Therapy (Signed)
[facility] [address] [place], [place], [address] Phone: [phone]   Fax:  [phone]  Physical Therapy Treatment/progress note  Patient Details  Name: April Randall MRN: [id number] Date of Birth: [birth year] Referring Provider: Corky Mull, MD   Progress Note Reporting Period 09/21/17  to 10/18/17  See note below for Objective Data and Assessment of Progress/Goals.       Encounter Date: 10/18/2017  PT End of Session - 10/18/17 1020    Visit Number  10    Date for PT Re-Evaluation  11/02/17    Authorization Type  Medicare     Authorization Time Period  09/21/17 to 11/02/17    Authorization - Visit Number  0    Authorization - Number of Visits  10    PT Start Time  0938    PT Stop Time  0932    PT Time Calculation (min)  54 min    Activity Tolerance  Patient tolerated treatment well;No increased pain    Behavior During Therapy  WFL for tasks assessed/performed       Past Medical History:  Diagnosis Date  . Arthritis   . Hypertension     Past Surgical History:  Procedure Laterality Date  . REPLACEMENT TOTAL KNEE Right     There were no vitals filed for this visit.  Subjective Assessment - 10/18/17 0941    Subjective  Pt reports that things are going well. She has no pain currently and has been completing her HEP.     Pertinent History  Rt TKA    Currently in Pain?  No/denies                       [facility] Adult PT Treatment/Exercise - 10/18/17 0001      Ambulation/Gait   Gait Comments  Pt ambulating 3x20 ft without AD, increased trunk rotation, no pain noted, no unsteadiness noted      Knee/Hip Exercises: Stretches   Passive Hamstring Stretch  2 reps;Left;30 seconds;Limitations    Passive Hamstring Stretch Limitations  standing with LE on step    Gastroc Stretch  3 reps;Left;30 seconds;Limitations    Gastroc Stretch Limitations  slant board       Knee/Hip Exercises: Aerobic    Nustep  L2 x3 min, PT present to discuss progress      Knee/Hip Exercises: Standing   Other Standing Knee Exercises  tandem stance with each LE forward 2x30 sec each      Knee/Hip Exercises: Supine   Straight Leg Raises  Left;2 sets;10 reps    Other Supine Knee/Hip Exercises  Clamshell 2x15 reps each LE with green TB       Vasopneumatic   Number Minutes Vasopneumatic   15 minutes    Vasopnuematic Location   Knee Lt    Vasopneumatic Pressure  Medium    Vasopneumatic Temperature   41      Manual Therapy   Soft tissue mobilization  Scar mobilization; inferior/superior patellar mobilization x2 bouts; Lt knee passive flexion x10 reps seated on mat table              PT Education - 10/18/17 1023    Education Details  technique with therex     Person(s) Educated  Patient    Methods  Explanation;Verbal cues    Comprehension  Verbalized understanding;Returned demonstration       PT Short Term Goals - 10/18/17 1024      PT  SHORT TERM GOAL #1   Title  Pt will demo understanding of her initial HEP to improve strength and mobility.    Time  2    Period  Weeks    Status  Achieved      PT SHORT TERM GOAL #2   Title  Pt will demo proper mechanics with sit to stand, without significant weight shift to the Rt, which will help improve strength in the LLE.    Time  3    Period  Weeks    Status  Achieved      PT SHORT TERM GOAL #3   Title  Pt will demo improved Lt knee active ROM into 0 deg extension and atleast 115 deg flexion, which will improve her mechanics with every day activity.     Baseline  115    Time  3    Period  Weeks    Status  Achieved        PT Long Term Goals - 10/18/17 1024      PT LONG TERM GOAL #1   Title  Pt will demo improved BLE strength to atleast 4/5 MMT which will increase safety with daily activity.     Time  6    Period  Weeks    Status  On-going      PT LONG TERM GOAL #2   Title  Pt will demo improved Lt knee proprioception and strength  evident by her ability to complete SLS on the Lt up to 15 sec, 2/3 trials, without LOB.    Time  6    Period  Weeks    Status  On-going      PT LONG TERM GOAL #3   Title  Pt will complete 5x sit to stand in less than 11 sec, which will reflect a return of her functional strength and endurance.     Time  6    Period  Weeks    Status  On-going      PT LONG TERM GOAL #4   Title  Pt will report atleast 50% reduction in knee pain with daily activity.    Time  6    Period  Weeks    Status  Achieved      PT LONG TERM GOAL #5   Title  Pt will ambulate atleast 380f outdoors without her AD, to reflect improvements in safety and independence during community ambulation.    Baseline  ambulating during session    Time  6    Period  Weeks    Status  Partially Met            Plan - 10/18/17 1026    Clinical Impression Statement  Pt arrived without reports of Lt knee pain. She is making steady progress towards her goals, with at least 115 deg of Lt knee flexion, improved stability during gait, and improving knee proprioception and balance. She was able to ambulate in the clinic without her AD, without unsteadiness noted. She does have remaining limitations in hip strength and knee flexion ROM and would benefit from skilled PT to ensure she is able to return to her PLOF without difficulty or limitation.    Rehab Potential  Excellent    PT Frequency  2x / week    PT Duration  8 weeks    PT Treatment/Interventions  ADLs/Self Care Home Management;Patient/family education;Cryotherapy;Electrical Stimulation;Moist Heat;Therapeutic exercise;Therapeutic activities;Functional mobility training;Stair training;Gait training;Balance training;Neuromuscular re-education;Manual techniques;Passive range of motion;Scar mobilization;Taping;Dry needling  PT Next Visit Plan  gait without SPC on unstable surfaces; begin balance activity; gentle functional strengthening; hamstring strength    PT Home Exercise Plan   [id number]     Consulted and Agree with Plan of Care  Patient       Patient will benefit from skilled therapeutic intervention in order to improve the following deficits and impairments:  Decreased activity tolerance, Impaired flexibility, Decreased strength, Decreased range of motion, Hypomobility, Postural dysfunction, Increased muscle spasms, Pain, Improper body mechanics  Visit Diagnosis: Acute pain of left knee  Muscle weakness (generalized)  Other abnormalities of gait and mobility  Localized edema     Problem List Patient Active Problem List   Diagnosis Date Noted  . Acute encephalopathy 03/26/2015  . Hypokalemia 03/26/2015  . 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 Arthritis   . Hypertension   . Essential hypertension    10:34 AM,10/18/17 Sherol Dade PT, [place] [facility] [place]  [facility] [address], [place], [place], [address] Phone: [phone]   Fax:  [phone]  Name: Wiletta Bermingham MRN: [id number] Date of Birth: 1950-12-23

## 2017-10-22 ENCOUNTER — Encounter: Payer: Self-pay | Admitting: Physical Therapy

## 2017-10-23 ENCOUNTER — Ambulatory Visit: Payer: Medicare Other

## 2017-10-23 DIAGNOSIS — M6281 Muscle weakness (generalized): Secondary | ICD-10-CM

## 2017-10-23 DIAGNOSIS — G8929 Other chronic pain: Secondary | ICD-10-CM

## 2017-10-23 DIAGNOSIS — R600 Localized edema: Secondary | ICD-10-CM

## 2017-10-23 DIAGNOSIS — M25562 Pain in left knee: Secondary | ICD-10-CM

## 2017-10-23 DIAGNOSIS — R2689 Other abnormalities of gait and mobility: Secondary | ICD-10-CM

## 2017-10-23 NOTE — Therapy (Signed)
[facility] [address] [place], [place], [address] Phone: [phone]   Fax:  [phone]  Physical Therapy Treatment  Patient Details  Name: April Randall MRN: [id number] Date of Birth: [birth year] Referring Provider: Corky Mull, MD   Encounter Date: 10/23/2017  PT End of Session - 10/23/17 0956    Visit Number  11    Date for PT Re-Evaluation  11/02/17    Authorization Type  Medicare     PT Start Time  0917    PT Stop Time  1012    PT Time Calculation (min)  55 min    Activity Tolerance  Patient tolerated treatment well;No increased pain    Behavior During Therapy  WFL for tasks assessed/performed       Past Medical History:  Diagnosis Date  . Arthritis   . Hypertension     Past Surgical History:  Procedure Laterality Date  . REPLACEMENT TOTAL KNEE Right     There were no vitals filed for this visit.                    [facility] Adult PT Treatment/Exercise - 10/23/17 0001      Knee/Hip Exercises: Standing   Heel Raises  Both;20 reps    Heel Raises Limitations  BUE support     Hip Extension  Stengthening;Both;20 reps    Forward Step Up  Left;2 sets;10 reps;Hand Hold: 1;Step Height: 6"    Rebounder  weight shifting 3 ways x 1 minute each      Knee/Hip Exercises: Seated   Long Arc Quad  Left;2 sets;15 reps    Long Arc Quad Weight  4 lbs.    Hamstring Curl  Left;Strengthening;2 sets;10 reps;Limitations    Hamstring Limitations  red theraband    Sit to Sand  10 reps;Other (comment) therapist cuing to decrease weight shift Rt       Knee/Hip Exercises: Supine   Straight Leg Raises  Left;2 sets;10 reps      Vasopneumatic   Number Minutes Vasopneumatic   15 minutes    Vasopnuematic Location   Knee    Vasopneumatic Pressure  Medium    Vasopneumatic Temperature   3 snowflakes               PT Short Term Goals - 10/18/17 1024      PT SHORT TERM GOAL #1   Title  Pt will  demo understanding of her initial HEP to improve strength and mobility.    Time  2    Period  Weeks    Status  Achieved      PT SHORT TERM GOAL #2   Title  Pt will demo proper mechanics with sit to stand, without significant weight shift to the Rt, which will help improve strength in the LLE.    Time  3    Period  Weeks    Status  Achieved      PT SHORT TERM GOAL #3   Title  Pt will demo improved Lt knee active ROM into 0 deg extension and atleast 115 deg flexion, which will improve her mechanics with every day activity.     Baseline  115    Time  3    Period  Weeks    Status  Achieved        PT Long Term Goals - 10/23/17 0939      PT LONG TERM GOAL #5   Title  Pt will  ambulate atleast 336ft outdoors without her AD, to reflect improvements in safety and independence during community ambulation.    Baseline  in the house    Time  6    Period  Weeks    Status  On-going            Plan - 10/23/17 0943    Clinical Impression Statement  Pt denies any Lt knee pain except when standing too long to cook.  Pt with improved Lt knee A/ROM and is able to ascend a 6" step today with bil UE support.  Pt ambulates short distances indoors without device.  Pt has limitations in Lt hip strength and requires verbal and demo cues for techinque with standing exercise.  Pt will continue to benefit from skilled PT for Lt knee strength, endurance, gait and edema management.      Rehab Potential  Excellent    PT Frequency  2x / week    PT Duration  8 weeks    PT Next Visit Plan  gait without SPC on unstable surfaces; begin balance activity; gentle functional strengthening; hamstring strength    PT Home Exercise Plan  [id number]     Consulted and Agree with Plan of Care  Patient       Patient will benefit from skilled therapeutic intervention in order to improve the following deficits and impairments:  Decreased activity tolerance, Impaired flexibility, Decreased strength, Decreased range of  motion, Hypomobility, Postural dysfunction, Increased muscle spasms, Pain, Improper body mechanics  Visit Diagnosis: Acute pain of left knee  Muscle weakness (generalized)  Other abnormalities of gait and mobility  Localized edema  Chronic pain of left knee     Problem List Patient Active Problem List   Diagnosis Date Noted  . Acute encephalopathy 03/26/2015  . Hypokalemia 03/26/2015  . 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 Arthritis   . Hypertension   . Essential hypertension      Sigurd Sos, PT 10/23/17 10:08 AM  [facility] [address] [place], [place], [address] Phone: [phone]   Fax:  [phone]  Name: April Randall MRN: [id number] Date of Birth: Jan 04, 1951

## 2017-10-24 ENCOUNTER — Encounter: Payer: Self-pay | Admitting: Physical Therapy

## 2017-10-25 ENCOUNTER — Ambulatory Visit: Payer: Medicare Other | Attending: Sports Medicine | Admitting: Physical Therapy

## 2017-10-25 DIAGNOSIS — R600 Localized edema: Secondary | ICD-10-CM | POA: Insufficient documentation

## 2017-10-25 DIAGNOSIS — M6281 Muscle weakness (generalized): Secondary | ICD-10-CM | POA: Diagnosis present

## 2017-10-25 DIAGNOSIS — R2689 Other abnormalities of gait and mobility: Secondary | ICD-10-CM | POA: Diagnosis present

## 2017-10-25 DIAGNOSIS — M25562 Pain in left knee: Secondary | ICD-10-CM

## 2017-10-25 NOTE — Patient Instructions (Signed)
Access Code: [facility]  URL: [email or web address]  Date: 10/25/2017  Prepared by: Elly Modena   Exercises  Bridge - 10 reps - 3 sets - 1x daily - 7x weekly  Hooklying Isometric Clamshell - 10 reps - 3 sets - 1x daily - 7x weekly  Standing Hip Extension - 10 reps - 3 sets - 1x daily - 7x weekly  Heel Raise - 20 reps - 2 sets - 1x daily - 7x weekly  Seated Knee Extension Stretch with Chair - 5 reps - 30 hold - 1x daily - 7x weekly    10:05 AM,10/25/17 Sherol Dade PT, DPT [place] at [place]

## 2017-10-25 NOTE — Therapy (Signed)
[facility] [address] [place], [place], [address] Phone: [phone]   Fax:  [phone]  Physical Therapy Treatment  Patient Details  Name: April [place] MRN: [id number] Date of Birth: 02-Dec-1950 Referring Provider: Corky Mull, MD   Encounter Date: 10/25/2017  PT End of Session - 10/25/17 1011    Visit Number  12    Date for PT Re-Evaluation  11/02/17    Authorization Type  Medicare     PT Start Time  0935    PT Stop Time  2542    PT Time Calculation (min)  53 min    Activity Tolerance  Patient tolerated treatment well;No increased pain    Behavior During Therapy  WFL for tasks assessed/performed       Past Medical History:  Diagnosis Date  . Arthritis   . Hypertension     Past Surgical History:  Procedure Laterality Date  . REPLACEMENT TOTAL KNEE Right     There were no vitals filed for this visit.  Subjective Assessment - 10/25/17 0937    Subjective  Pt reports that things are going well. She did not have to take any medication this week.     Pertinent History  Rt TKA    Currently in Pain?  No/denies         [facility] PT Assessment - 10/25/17 0001      Assessment   Medical Diagnosis  preoperative treatment of Lt knee    Referring Provider  Corky Mull, MD    Onset Date/Surgical Date  09/17/17    Prior Therapy  none       Precautions   Precautions  None      Balance Screen   Has the patient fallen in the past 6 months  No    Has the patient had a decrease in activity level because of a fear of falling?   No    Is the patient reluctant to leave their home because of a fear of falling?   No      Home Film/video editor residence      Prior Function   Level of Independence  Independent      Cognition   Overall Cognitive Status  Within Functional Limits for tasks assessed      Observation/Other Assessments   Observations  Pt wearing compression garment        AROM   Overall AROM Comments  Rt knee AROM WNL    Left Knee Extension  5 lacking     Left Knee Flexion  105      Strength   Right Hip Flexion  4/5    Right Hip Extension  3/5    Right Hip ABduction  3/5    Left Hip Flexion  3/5    Left Hip Extension  3/5    Left Hip ABduction  4/5    Left Knee Flexion  -- unable to assess secondary to pain    Left Knee Extension  4/5      Palpation   Palpation comment  tenderness along Lt quadriceps      Bed Mobility   Bed Mobility  --     Transfers   Five time sit to stand comments   --      Ambulation/Gait   Gait Comments  --      High Level Balance   High Level Balance Comments  SLS 10+ sec each  NBOS on foam with trunk rotation Lt and Rt x10 reps; Tandem stance on foam with each LE forward 2x30 sec each     [facility] Adult PT Treatment/Exercise - 10/25/17 0001      Knee/Hip Exercises: Seated   Other Seated Knee/Hip Exercises  Rt knee extension stretch 5x30 sec hold     Hamstring Curl  Left;2 sets;10 reps    Hamstring Limitations  green TB      Knee/Hip Exercises: Supine   Bridges Limitations  x10 reps     Straight Leg Raises  Right;2 sets;10 reps    Other Supine Knee/Hip Exercises  single leg clamshell with blue TB 2x10 reps each      Vasopneumatic   Number Minutes Vasopneumatic   15 minutes    Vasopnuematic Location   Knee    Vasopneumatic Pressure  Medium    Vasopneumatic Temperature   3 snowflakes             PT Education - 10/25/17 1011    Education Details  discussed stability with ambulation and balance activity and encouraged her to stop using SPC; updated and reviewed HEP    Person(s) Educated  Patient    Methods  Explanation;Verbal cues;Handout    Comprehension  Verbalized understanding;Returned demonstration       PT Short Term Goals - 10/18/17 1024      PT SHORT TERM GOAL #1   Title  Pt will demo understanding of her initial HEP to improve strength and mobility.    Time  2     Period  Weeks    Status  Achieved      PT SHORT TERM GOAL #2   Title  Pt will demo proper mechanics with sit to stand, without significant weight shift to the Rt, which will help improve strength in the LLE.    Time  3    Period  Weeks    Status  Achieved      PT SHORT TERM GOAL #3   Title  Pt will demo improved Lt knee active ROM into 0 deg extension and atleast 115 deg flexion, which will improve her mechanics with every day activity.     Baseline  115    Time  3    Period  Weeks    Status  Achieved        PT Long Term Goals - 10/23/17 8469      PT LONG TERM GOAL #5   Title  Pt will ambulate atleast 336ft outdoors without her AD, to reflect improvements in safety and independence during community ambulation.    Baseline  in the house    Time  6    Period  Weeks    Status  On-going            Plan - 10/25/17 1012    Clinical Impression Statement  Pt is making steady progress towards her goals. She was able to maintain single leg stance on the Lt for greater than 10 sec this session. Also noted increase in hip and knee strength with MMT. She does have limitation in end range Lt knee flexion and extension which she was encouraged to work on at home. HEP was updated to address hip weakness and pt demonstrated good understanding of all exercises without reports of increase in pain.     Rehab Potential  Excellent    PT Frequency  2x / week    PT Duration  8 weeks    PT  Treatment/Interventions  ADLs/Self Care Home Management;Patient/family education;Cryotherapy;Electrical Stimulation;Moist Heat;Therapeutic exercise;Therapeutic activities;Functional mobility training;Stair training;Gait training;Balance training;Neuromuscular re-education;Manual techniques;Passive range of motion;Scar mobilization;Taping;Dry needling    PT Next Visit Plan  gait without SPC on unstable surfaces; begin balance activity; gentle functional strengthening; hamstring strength    PT Home Exercise Plan   [id number]     Consulted and Agree with Plan of Care  Patient       Patient will benefit from skilled therapeutic intervention in order to improve the following deficits and impairments:  Decreased activity tolerance, Impaired flexibility, Decreased strength, Decreased range of motion, Hypomobility, Postural dysfunction, Increased muscle spasms, Pain, Improper body mechanics  Visit Diagnosis: Acute pain of left knee  Muscle weakness (generalized)  Other abnormalities of gait and mobility  Localized edema     Problem List Patient Active Problem List   Diagnosis Date Noted  . Acute encephalopathy 03/26/2015  . Hypokalemia 03/26/2015  . 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 Arthritis   . Hypertension   . Essential hypertension     11:01 AM,10/25/17 Sherol Dade PT, DPT [place] [facility] [place]  [facility] [address] [place], [place], [address] Phone: [phone]   Fax:  [phone]  Name: April Randall MRN: [id number] Date of Birth: 1950/08/08

## 2017-10-29 ENCOUNTER — Encounter: Payer: Self-pay | Admitting: Physical Therapy

## 2017-10-29 ENCOUNTER — Ambulatory Visit: Payer: Medicare Other | Admitting: Physical Therapy

## 2017-10-29 DIAGNOSIS — M25562 Pain in left knee: Secondary | ICD-10-CM | POA: Diagnosis not present

## 2017-10-29 DIAGNOSIS — M6281 Muscle weakness (generalized): Secondary | ICD-10-CM

## 2017-10-29 DIAGNOSIS — R600 Localized edema: Secondary | ICD-10-CM

## 2017-10-29 DIAGNOSIS — R2689 Other abnormalities of gait and mobility: Secondary | ICD-10-CM

## 2017-10-29 NOTE — Therapy (Signed)
[facility] [address], [place], [place], [address] Phone: [phone]   Fax:  [phone]  Physical Therapy Treatment  Patient Details  Name: April Randall MRN: [id number] Date of Birth: 05/11/1950 Referring Provider: Corky Mull, MD   Encounter Date: 10/29/2017  PT End of Session - 10/29/17 1003    Visit Number  13    Date for PT Re-Evaluation  11/02/17    Authorization Type  Medicare     PT Start Time  0933    PT Stop Time  1027    PT Time Calculation (min)  54 min    Activity Tolerance  Patient tolerated treatment well;No increased pain    Behavior During Therapy  WFL for tasks assessed/performed       Past Medical History:  Diagnosis Date  . Arthritis   . Hypertension     Past Surgical History:  Procedure Laterality Date  . REPLACEMENT TOTAL KNEE Right     There were no vitals filed for this visit.  Subjective Assessment - 10/29/17 0936    Subjective  Pt reports things are going well. No issues with her HEP. No pain currently.     Pertinent History  Rt TKA    Currently in Pain?  No/denies                       [facility] Adult PT Treatment/Exercise - 10/29/17 0001      Knee/Hip Exercises: Stretches   Passive Hamstring Stretch  4 reps;20 seconds;Left    Passive Hamstring Stretch Limitations  LE on step    Knee: Self-Stretch to increase Flexion  Left;5 reps;20 seconds      Knee/Hip Exercises: Standing   Heel Raises  Both;1 set;20 reps    Heel Raises Limitations  cues to shift weight onto Lt side    Knee Flexion  Both;2 sets;10 reps;Strengthening;Limitations    Knee Flexion Limitations  2# ankle weights     Hip Extension  Both;2 sets;10 reps;Knee straight;Stengthening    Extension Limitations  2# ankle weights     Forward Step Up  2 sets;20 reps;Left;Step Height: 6";Hand Hold: 1    Other Standing Knee Exercises  sidestepping along countertop each direction x1 reps, yellow TB  around feet; sidestepping along countertop with yellow TB around ankles x2 reps each direction       Knee/Hip Exercises: Supine   Heel Slides  AAROM;Left;1 set;10 reps;Limitations    Heel Slides Limitations  5 sec hold for end range stretch      Vasopneumatic   Number Minutes Vasopneumatic   15 minutes    Vasopnuematic Location   Knee    Vasopneumatic Pressure  Medium    Vasopneumatic Temperature   3 snowflakes      Manual Therapy   Soft tissue mobilization  Lt scar mobilization to decrease scar adhesions             PT Education - 10/29/17 1012    Education Details  scar mobilization    Person(s) Educated  Patient    Methods  Explanation;Demonstration    Comprehension  Verbalized understanding       PT Short Term Goals - 10/18/17 1024      PT SHORT TERM GOAL #1   Title  Pt will demo understanding of her initial HEP to improve strength and mobility.    Time  2    Period  Weeks    Status  Achieved  PT SHORT TERM GOAL #2   Title  Pt will demo proper mechanics with sit to stand, without significant weight shift to the Rt, which will help improve strength in the LLE.    Time  3    Period  Weeks    Status  Achieved      PT SHORT TERM GOAL #3   Title  Pt will demo improved Lt knee active ROM into 0 deg extension and atleast 115 deg flexion, which will improve her mechanics with every day activity.     Baseline  115    Time  3    Period  Weeks    Status  Achieved        PT Long Term Goals - 10/23/17 8315      PT LONG TERM GOAL #5   Title  Pt will ambulate atleast 328ft outdoors without her AD, to reflect improvements in safety and independence during community ambulation.    Baseline  in the house    Time  6    Period  Weeks    Status  On-going            Plan - 10/29/17 1002    Clinical Impression Statement  Today's session focused on therex to further promote LE strength and knee flexibility. Pt required cuing to improve technique with hip  strengthening exercises completed during today's session, but reported no increase in pain following standing exercises. Completed soft tissue mobilization to decrease scar adhesions and encouraged pt to continue working on this as well. Discussed upcoming re-evaluation to determine the need for continued PT versus discharge with an advanced HEP and pt verbalized understanding of this.     Rehab Potential  Excellent    PT Frequency  2x / week    PT Duration  8 weeks    PT Treatment/Interventions  ADLs/Self Care Home Management;Patient/family education;Cryotherapy;Electrical Stimulation;Moist Heat;Therapeutic exercise;Therapeutic activities;Functional mobility training;Stair training;Gait training;Balance training;Neuromuscular re-education;Manual techniques;Passive range of motion;Scar mobilization;Taping;Dry needling    PT Next Visit Plan  re-evaluation    PT Home Exercise Plan  [id number]     Consulted and Agree with Plan of Care  Patient       Patient will benefit from skilled therapeutic intervention in order to improve the following deficits and impairments:  Decreased activity tolerance, Impaired flexibility, Decreased strength, Decreased range of motion, Hypomobility, Postural dysfunction, Increased muscle spasms, Pain, Improper body mechanics  Visit Diagnosis: Acute pain of left knee  Muscle weakness (generalized)  Other abnormalities of gait and mobility  Localized edema     Problem List Patient Active Problem List   Diagnosis Date Noted  . Acute encephalopathy 03/26/2015  . Hypokalemia 03/26/2015  . 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 Arthritis   . Hypertension   . Essential hypertension     10:13 AM,10/29/17 Sherol Dade PT, DPT [place] [facility] [place] [address] [place], [place], [address] Phone: [phone]   Fax:  [phone]  Name:  Kiya Eno MRN: [id number] Date of Birth: 02-11-1951

## 2017-10-30 ENCOUNTER — Encounter: Payer: Self-pay | Admitting: Physical Therapy

## 2017-10-31 ENCOUNTER — Encounter: Payer: Self-pay | Admitting: Physical Therapy

## 2017-10-31 ENCOUNTER — Ambulatory Visit: Payer: Medicare Other | Admitting: Physical Therapy

## 2017-10-31 DIAGNOSIS — M25562 Pain in left knee: Secondary | ICD-10-CM | POA: Diagnosis not present

## 2017-10-31 DIAGNOSIS — R600 Localized edema: Secondary | ICD-10-CM

## 2017-10-31 DIAGNOSIS — R2689 Other abnormalities of gait and mobility: Secondary | ICD-10-CM

## 2017-10-31 DIAGNOSIS — M6281 Muscle weakness (generalized): Secondary | ICD-10-CM

## 2017-10-31 NOTE — Therapy (Signed)
[facility] [address] [place], [place], [address] Phone: [phone]   Fax:  [phone]  Physical Therapy Treatment/Discharge  Patient Details  Name: April Randall MRN: [id number] Date of Birth: 1950/12/08 Referring Provider: Corky Mull, MD    Encounter Date: 10/31/2017  PT End of Session - 10/31/17 0949    Visit Number  14    Date for PT Re-Evaluation  11/02/17    Authorization Type  Medicare     PT Start Time  0941 Pt arrived late and requested game ready end of session     PT Stop Time  1030    PT Time Calculation (min)  49 min    Activity Tolerance  Patient tolerated treatment well;No increased pain    Behavior During Therapy  WFL for tasks assessed/performed       Past Medical History:  Diagnosis Date  . Arthritis   . Hypertension     Past Surgical History:  Procedure Laterality Date  . REPLACEMENT TOTAL KNEE Right     There were no vitals filed for this visit.  Subjective Assessment - 10/31/17 0953    Subjective  Pt reports that things are going well. She is still working on her exercises regularly. No pain currently.     Pertinent History  Rt TKA    Currently in Pain?  No/denies         [facility] PT Assessment - 10/31/17 0001      Assessment   Medical Diagnosis  preoperative treatment of Lt knee    Referring Provider  Corky Mull, MD     Onset Date/Surgical Date  09/17/17    Next MD Visit  11/01/17    Prior Therapy  none       Precautions   Precautions  None      Balance Screen   Has the patient fallen in the past 6 months  No    Has the patient had a decrease in activity level because of a fear of falling?   No    Is the patient reluctant to leave their home because of a fear of falling?   No      Home Film/video editor residence      Prior Function   Level of Independence  Independent      Cognition   Overall Cognitive Status  Within Functional  Limits for tasks assessed      Observation/Other Assessments   Observations  Pt wearing compression garment     Focus on Therapeutic Outcomes (FOTO)   22% limited       AROM   Overall AROM Comments  Rt knee AROM WNL    Left Knee Extension  0    Left Knee Flexion  110      Strength   Right Hip Flexion  4/5    Right Hip Extension  5/5    Right Hip ABduction  4/5    Left Hip Flexion  3/5    Left Hip Extension  5/5    Left Hip ABduction  4/5    Left Knee Flexion  4/5    Left Knee Extension  4/5      Palpation   Palpation comment  tenderness along Lt quadriceps      Bed Mobility   Bed Mobility  --      High Level Balance   High Level Balance Comments  SLS: 15 sec on each side  [facility] Adult PT Treatment/Exercise - 10/31/17 0001      Transfers   Five time sit to stand comments   11 sec, no UE       Vasopneumatic   Number Minutes Vasopneumatic   15 minutes    Vasopnuematic Location   Knee    Vasopneumatic Pressure  Medium    Vasopneumatic Temperature   3 snowflakes        Standing hip extension with yellow TB x5 reps Lt       PT Education - 10/31/17 1030    Education Details  goals, progress since beginning PT; HEP updates; decreasing use of SPC due to good stability    Person(s) Educated  Patient    Methods  Explanation;Handout;Demonstration    Comprehension  Verbalized understanding;Returned demonstration       PT Short Term Goals - 10/31/17 1059      PT SHORT TERM GOAL #1   Title  Pt will demo understanding of her initial HEP to improve strength and mobility.    Time  2    Period  Weeks    Status  Achieved      PT SHORT TERM GOAL #2   Title  Pt will demo proper mechanics with sit to stand, without significant weight shift to the Rt, which will help improve strength in the LLE.    Time  3    Period  Weeks    Status  Achieved      PT SHORT TERM GOAL #3   Title  Pt will demo improved Lt knee active ROM into 0 deg extension and atleast  115 deg flexion, which will improve her mechanics with every day activity.     Baseline  115    Time  3    Period  Weeks    Status  Achieved        PT Long Term Goals - 10/31/17 1000      PT LONG TERM GOAL #1   Title  Pt will demo improved BLE strength to atleast 4/5 MMT which will increase safety with daily activity.     Baseline  4/5 MMT and 5/5 MMT     Time  6    Period  Weeks    Status  Achieved      PT LONG TERM GOAL #2   Title  Pt will demo improved Lt knee proprioception and strength evident by her ability to complete SLS on the Lt up to 15 sec, 2/3 trials, without LOB.    Baseline  15+ sec each side, no LOB or unsteadiness     Time  6    Period  Weeks    Status  Achieved      PT LONG TERM GOAL #3   Title  Pt will complete 5x sit to stand in less than 11 sec, which will reflect a return of her functional strength and endurance.     Time  6    Period  Weeks    Status  Achieved      PT LONG TERM GOAL #4   Title  Pt will report atleast 50% reduction in knee pain with daily activity.    Baseline  100% improved     Time  6    Period  Weeks    Status  Achieved      PT LONG TERM GOAL #5   Title  Pt will ambulate atleast 371f outdoors without her AD, to reflect improvements in safety  and independence during community ambulation.    Baseline  ambulating during session without AD or unsteadiness    Time  6    Period  Weeks    Status  Achieved            Plan - 10/31/17 1019    Clinical Impression Statement  Pt was discharged this visit having met all of her short and long term goals since beginning PT. She has Lt knee flexion ROM to 115 deg and LE strength atleast 4/5 MMT. She feels 100% improved and is confident with her HEP at this time. She is able to ambulate without limitations and demonstrates good stability in single leg stance. At this time, she will continue to make progress in her strength and endurance with regular walking and HEP adherence. She is  agreeable with d/c at this time.     Rehab Potential  Excellent    PT Frequency  2x / week    PT Duration  8 weeks    PT Treatment/Interventions  ADLs/Self Care Home Management;Patient/family education;Cryotherapy;Electrical Stimulation;Moist Heat;Therapeutic exercise;Therapeutic activities;Functional mobility training;Stair training;Gait training;Balance training;Neuromuscular re-education;Manual techniques;Passive range of motion;Scar mobilization;Taping;Dry needling    PT Next Visit Plan  d/c home     PT Home Exercise Plan  [email or web address]     Consulted and Agree with Plan of Care  Patient       Patient will benefit from skilled therapeutic intervention in order to improve the following deficits and impairments:  Decreased activity tolerance, Impaired flexibility, Decreased strength, Decreased range of motion, Hypomobility, Postural dysfunction, Increased muscle spasms, Pain, Improper body mechanics  Visit Diagnosis: Acute pain of left knee  Muscle weakness (generalized)  Other abnormalities of gait and mobility  Localized edema     Problem List Patient Active Problem List   Diagnosis Date Noted  . Acute encephalopathy 03/26/2015  . Hypokalemia 03/26/2015  . 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 Arthritis   . Hypertension   . Essential hypertension     PHYSICAL THERAPY DISCHARGE SUMMARY  Visits from Start of Care: 14  Current functional level related to goals / functional outcomes: See above for more details    Remaining deficits: See above for more details    Education / Equipment: See above for more details  Plan: Patient agrees to discharge.  Patient goals were met. Patient is being discharged due to meeting the stated rehab goals.  ?????          12:27 PM,10/31/17 [place] [facility] [place] [facility] [address] [place], [place], [address] Phone: [phone]   Fax:  [phone]  Name: Theone Bowell MRN: [id number] Date of Birth: February 15, 1951

## 2017-10-31 NOTE — Patient Instructions (Signed)
Access Code: [facility]  URL: [email or web address]  Date: 10/31/2017  Prepared by: Elly Modena   Exercises  Bridge - 10 reps - 3 sets - 1x daily - 7x weekly  Hooklying Isometric Clamshell - 10 reps - 3 sets - 1x daily - 7x weekly  Standing Hip Extension - 10 reps - 3 sets - 1x daily - 7x weekly  Heel Raise - 20 reps - 2 sets - 1x daily - 7x weekly  Seated Knee Extension Stretch with Chair - 5 reps - 30 hold - 1x daily - 7x weekly  Seated Knee Flexion Slide - 10 reps - 3 sets - 1x daily - 7x weekly    10:11 AM,10/31/17 Sherol Dade PT, [place] at [place]

## 2017-11-01 ENCOUNTER — Encounter: Payer: Self-pay | Admitting: Physical Therapy

## 2017-11-05 ENCOUNTER — Telehealth: Payer: Self-pay | Admitting: Physical Therapy

## 2017-11-05 NOTE — Telephone Encounter (Signed)
Attempted to contact pt regarding request for new PT evaluation. Left voicemail to return call at her earliest convenience.    2:22 PM,11/05/17 Sherol Dade PT, [place] at [place]

## 2017-11-06 ENCOUNTER — Telehealth: Payer: Self-pay | Admitting: Physical Therapy

## 2017-11-06 ENCOUNTER — Encounter: Payer: Self-pay | Admitting: Physical Therapy

## 2017-11-06 NOTE — Telephone Encounter (Signed)
Called and spoke with pt who feels that her exercises do not challenge her enough. States she is not having any problems otherwise. Therapist offered to update HEP and provide for pt to pick up. She would like this. Will leave copy with front desk.  3:06 PM,11/06/17 Sherol Dade PT, [place] at [place]

## 2017-11-08 ENCOUNTER — Encounter: Payer: Self-pay | Admitting: Physical Therapy

## 2018-02-26 ENCOUNTER — Other Ambulatory Visit: Payer: Self-pay | Admitting: Internal Medicine

## 2018-02-26 DIAGNOSIS — Z1231 Encounter for screening mammogram for malignant neoplasm of breast: Secondary | ICD-10-CM

## 2018-04-09 ENCOUNTER — Ambulatory Visit: Payer: Self-pay

## 2018-04-09 ENCOUNTER — Ambulatory Visit
Admission: RE | Admit: 2018-04-09 | Discharge: 2018-04-09 | Disposition: A | Discharge status: Home / Self Care | Payer: Medicare Other | Source: Ambulatory Visit | Attending: Internal Medicine | Admitting: Internal Medicine

## 2018-04-09 DIAGNOSIS — Z1231 Encounter for screening mammogram for malignant neoplasm of breast: Secondary | ICD-10-CM

## 2019-04-22 ENCOUNTER — Other Ambulatory Visit: Payer: Self-pay | Admitting: Internal Medicine

## 2019-04-22 DIAGNOSIS — Z1231 Encounter for screening mammogram for malignant neoplasm of breast: Secondary | ICD-10-CM

## 2019-05-29 ENCOUNTER — Ambulatory Visit: Payer: Medicare Other | Attending: Internal Medicine

## 2019-05-29 DIAGNOSIS — Z23 Encounter for immunization: Secondary | ICD-10-CM | POA: Insufficient documentation

## 2019-05-29 NOTE — Progress Notes (Signed)
   Covid-19 Vaccination Clinic  Name:  April [place]    MRN: [id number] DOB: Aug 10, 1950  05/29/2019  [name] was observed post Covid-19 immunization for 15 minutes without incident. She was provided with Vaccine Information Sheet and instruction to access the V-Safe system.   [name] was instructed to call 911 with any severe reactions post vaccine: Marland Kitchen Difficulty breathing  . Swelling of face and throat  . A fast heartbeat  . A bad rash all over body  . Dizziness and weakness   Immunizations Administered    Name Date Dose VIS Date Route   Pfizer COVID-19 Vaccine 05/29/2019  9:44 AM 0.3 mL 03/07/2019 Intramuscular   Manufacturer: [place]   Lot: [id number]   [place]: [id number]

## 2019-06-02 ENCOUNTER — Ambulatory Visit
Admission: RE | Admit: 2019-06-02 | Discharge: 2019-06-02 | Disposition: A | Discharge status: Home / Self Care | Payer: Medicare Other | Source: Ambulatory Visit | Attending: Internal Medicine | Admitting: Internal Medicine

## 2019-06-02 ENCOUNTER — Other Ambulatory Visit: Payer: Self-pay

## 2019-06-02 DIAGNOSIS — Z1231 Encounter for screening mammogram for malignant neoplasm of breast: Secondary | ICD-10-CM

## 2019-06-25 ENCOUNTER — Ambulatory Visit: Payer: Medicare (Managed Care) | Attending: Internal Medicine

## 2019-06-25 DIAGNOSIS — Z23 Encounter for immunization: Secondary | ICD-10-CM

## 2019-06-25 NOTE — Progress Notes (Signed)
   Covid-19 Vaccination Clinic  Name:  Whisper [place]    MRN: [id number] DOB: May 29, 1950  06/25/2019  Ms. Christo was observed post Covid-19 immunization for 15 minutes without incident. She was provided with Vaccine Information Sheet and instruction to access the V-Safe system.   Ms. Picker was instructed to call 911 with any severe reactions post vaccine: Marland Kitchen Difficulty breathing  . Swelling of face and throat  . A fast heartbeat  . A bad rash all over body  . Dizziness and weakness   Immunizations Administered    Name Date Dose VIS Date Route   Pfizer COVID-19 Vaccine 06/25/2019  9:22 AM 0.3 mL 03/07/2019 Intramuscular   Manufacturer: [place]   Lot: [id number]   [place]: [id number]

## 2019-07-25 ENCOUNTER — Other Ambulatory Visit: Payer: Self-pay

## 2019-07-25 ENCOUNTER — Ambulatory Visit (HOSPITAL_COMMUNITY)
Admission: EM | Admit: 2019-07-25 | Discharge: 2019-07-25 | Disposition: A | Discharge status: Home / Self Care | Payer: Medicare (Managed Care)

## 2019-07-25 ENCOUNTER — Encounter (HOSPITAL_COMMUNITY): Payer: Self-pay

## 2019-07-25 DIAGNOSIS — R42 Dizziness and giddiness: Secondary | ICD-10-CM

## 2019-07-25 DIAGNOSIS — I10 Essential (primary) hypertension: Secondary | ICD-10-CM | POA: Diagnosis not present

## 2019-07-25 LAB — POCT URINALYSIS DIP (DEVICE)
Bilirubin Urine: NEGATIVE
Glucose, UA: NEGATIVE mg/dL
Hgb urine dipstick: NEGATIVE
Ketones, ur: NEGATIVE mg/dL
Leukocytes,Ua: NEGATIVE
Nitrite: NEGATIVE
Protein, ur: NEGATIVE mg/dL
Specific Gravity, Urine: 1.015 (ref 1.005–1.030)
Urobilinogen, UA: 0.2 mg/dL (ref 0.0–1.0)
pH: 8.5 — ABNORMAL HIGH (ref 5.0–8.0)

## 2019-07-25 LAB — CBG MONITORING, ED: Glucose-Capillary: 89 mg/dL (ref 70–99)

## 2019-07-25 NOTE — ED Triage Notes (Signed)
Pt reports she woks up this morning feeling lightheaded.

## 2019-07-25 NOTE — Discharge Instructions (Addendum)
Please follow-up with Primary Care at [facility] to establish care as a new patient. All testing today was within your baseline.  Continue hydrate well with water. When awakening in the morning, sit on side of bed before abruptly standing up as this can cause dizziness. If symptoms worsen return for follow-up.  Monitor blood pressure at home. If readings are consistent >160/100 with taking both blood pressure medications, go to the emergency department for further evaluation.

## 2019-07-25 NOTE — ED Provider Notes (Addendum)
[place]    CSN: [id number] Arrival date & time: 07/25/19  1005      History   Chief Complaint Chief Complaint  Patient presents with  . Dizziness    HPI April Randall is a 69 y.o. female.   HPI Patient presents for evaluation of an episode of dizziness occurring upon awakening this morning. Patient reports while ambulating to restroom this morning she experienced one episodes of feeling off balance and dizzy. She returned to bed and after few minutes symptoms resolve and have not reoccurred today.  Patient's medical history is significant for hypertension and hypercholesterolemia.  She has had any recent follow-up with her PCP within a year.  She reports she has been feeling well up until the dizzy episode that occurred this morning.  She denies any chest pain, shortness of breath, weakness, or confusion.  She endorses that she normally hydrates well by drinking at least four 16 ounce bottles of water daily.  She does not routinely check her blood pressure at home.  Blood pressure is elevated on arrival today.  She is prescribed amlodipine and lisinopril for blood pressure management. Past Medical History:  Diagnosis Date  . Arthritis   . Hypertension     Patient Active Problem List   Diagnosis Date Noted  . Acute encephalopathy 03/26/2015  . Hypokalemia 03/26/2015  . 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 Arthritis   . Hypertension   . Essential hypertension     Past Surgical History:  Procedure Laterality Date  . REPLACEMENT TOTAL KNEE Right     OB History   No obstetric history on file.      Home Medications    Prior to Admission medications   Medication Sig Start Date End Date Taking? Authorizing Provider  amLODipine (NORVASC) 10 MG tablet Take 10 mg by mouth daily.    [provider]  aspirin 325 MG tablet Take 1 tablet (325 mg total) by mouth daily. 03/27/15   Mikhail, Velta Addison, DO  atorvastatin (LIPITOR) 20 MG tablet Take 20 mg by  mouth at bedtime. 04/18/19   [provider]  hydrochlorothiazide (HYDRODIURIL) 25 MG tablet Take 25 mg by mouth daily.    [provider]  lisinopril (PRINIVIL,ZESTRIL) 20 MG tablet Take 20 mg by mouth daily.    [provider]  simvastatin (ZOCOR) 20 MG tablet Take 1 tablet (20 mg total) by mouth at bedtime. Patient not taking: Reported on 09/11/2017 03/27/15   Cristal Ford, DO    Family History Family History  Problem Relation Age of Onset  . Hypertension Mother   . Breast cancer Neg Hx     Social History Social History   Tobacco Use  . Smoking status: Never Smoker  . Smokeless tobacco: Never Used  Substance Use Topics  . Alcohol use: No    Alcohol/week: 0.0 standard drinks  . Drug use: No     Allergies   Patient has no known allergies.   Review of Systems Review of Systems Pertinent negatives listed in HPI  Physical Exam Triage Vital Signs ED Triage Vitals  Enc Vitals Group     BP 07/25/19 1044 (!) 158/89     Pulse Rate 07/25/19 1044 100     Resp 07/25/19 1044 18     Temp 07/25/19 1044 98.9 F (37.2 C)     Temp Source 07/25/19 1044 Oral     SpO2 --      Weight --      Height --  Head Circumference --      Peak Flow --      Pain Score 07/25/19 1042 0     Pain Loc --      Pain Edu? --      Excl. in [place]? --    Orthostatic VS for the past 24 hrs:  BP- Lying Pulse- Lying BP- Sitting Pulse- Sitting BP- Standing at 0 minutes Pulse- Standing at 0 minutes  07/25/19 1121 147/85 75 (!) 145/94 78 (!) 164/99 79    Updated Vital Signs BP (!) 158/89 (BP Location: Left Arm)   Pulse 100   Temp 98.9 F (37.2 C) (Oral)   Resp 18   Visual Acuity Right Eye Distance:   Left Eye Distance:   Bilateral Distance:    Right Eye Near:   Left Eye Near:    Bilateral Near:     Physical Exam Constitutional: Patient appears well-developed and well-nourished. No distress. HENT: Normocephalic, atraumatic, External right and left ear  normal. Eyes: Conjunctivae and EOM are normal. PERRLA, no scleral icterus. Neck: Normal ROM. Neck supple. No JVD. No tracheal deviation. No thyromegaly. CVS: RRR, S1/S2 +, no murmurs, no gallops, no carotid bruit.  Pulmonary: Effort and breath sounds normal, no stridor, rhonchi, wheezes, rales.  Abdominal: Soft. BS +, no distension, tenderness, rebound or guarding.  Musculoskeletal: Normal range of motion. No edema and no tenderness.  Neuro: Alert. Normal gait. Negative Romberg. Normal coordination. No cranial nerve deficit. Skin: Skin is warm and dry. No rash noted. Not diaphoretic. No erythema. No pallor. Psychiatric: Normal mood and affect. Behavior, judgment, thought content normal. Normal exam  UC Treatments / Results  Labs (all labs ordered are listed, but only abnormal results are displayed) Labs Reviewed  POCT URINALYSIS DIP (DEVICE) - Abnormal; Notable for the following components:      Result Value   pH 8.5 (*)    All other components within normal limits  CBG MONITORING, ED    EKG NSR, 76 BPM, with non-specific T wave abnormality-w/o significant elevation or depression of ST segment. Negative for acute changes. Compared to prior ECG 03/28/2015 no significant changes noted.  Radiology No results found.  Procedures Procedures (including critical care time)  Medications Ordered in UC Medications - No data to display  Initial Impression / Assessment and Plan / UC Course  I have reviewed the triage vital signs and the nursing notes.  Pertinent labs & imaging results that were available during my care of the patient were reviewed by me and considered in my medical decision making (see chart for details).    Dizziness, x 1 episode upon awakening today -Suspect patient experienced dizziness from rising to standing position suddenly. Patient is slightly orthostatic when changing from standing to sitting position. Urine specific gravity show mildly dehydrated which could have  contributed to symptoms. ECG is negative for any acute changes.  -Recommend changing positions slowly. -Hydrate with 6-8 eight ounce glass of water. Strict return precautions. Red flags discussed.   Essential hypertension, uncontrolled, today. Asymptomatic today -Continue current antihypertensive regimen and follow-up with primary care for further evaluation and management of hypertension.   Final Clinical Impressions(s) / UC Diagnoses   Final diagnoses:  Dizziness  Essential hypertension     Discharge Instructions     Please follow-up with Primary Care at [facility] to establish care as a new patient. All testing today was within your baseline.  Continue hydrate well with water. When awakening in the morning, sit on side of bed before  abruptly standing up as this can cause dizziness. If symptoms worsen return for follow-up.  Monitor blood pressure at home. If readings are consistent >160/100 with taking both blood pressure medications, go to the emergency department for further evaluation.    ED Prescriptions    None     PDMP not reviewed this encounter.   Scot Jun, FNP 07/27/19 [place], [place], FNP 07/27/19 [phone]

## 2019-08-22 ENCOUNTER — Telehealth (INDEPENDENT_AMBULATORY_CARE_PROVIDER_SITE_OTHER): Payer: Medicare (Managed Care) | Admitting: Internal Medicine

## 2019-08-22 ENCOUNTER — Other Ambulatory Visit: Payer: Self-pay

## 2019-08-22 ENCOUNTER — Encounter: Payer: Self-pay | Admitting: Internal Medicine

## 2019-08-22 DIAGNOSIS — R42 Dizziness and giddiness: Secondary | ICD-10-CM

## 2019-08-22 DIAGNOSIS — E785 Hyperlipidemia, unspecified: Secondary | ICD-10-CM

## 2019-08-22 DIAGNOSIS — I10 Essential (primary) hypertension: Secondary | ICD-10-CM

## 2019-08-22 DIAGNOSIS — Z7689 Persons encountering health services in other specified circumstances: Secondary | ICD-10-CM

## 2019-08-22 DIAGNOSIS — Z1211 Encounter for screening for malignant neoplasm of colon: Secondary | ICD-10-CM | POA: Diagnosis not present

## 2019-08-22 MED ORDER — ATORVASTATIN CALCIUM 20 MG PO TABS: 20.0000 mg | ORAL_TABLET | Freq: Every day | ORAL | 0 refills | Status: DC

## 2019-08-22 MED ORDER — AMLODIPINE BESYLATE 10 MG PO TABS: 10.0000 mg | ORAL_TABLET | Freq: Every day | ORAL | 0 refills | Status: DC

## 2019-08-22 MED ORDER — LISINOPRIL 20 MG PO TABS: 20.0000 mg | ORAL_TABLET | Freq: Every day | ORAL | 0 refills | Status: DC

## 2019-08-22 NOTE — Progress Notes (Signed)
Virtual Visit via Telephone Note  I connected with April Randall, on 08/22/2019 at 9:41 AM by telephone due to the COVID-19 pandemic and verified that I am speaking with the correct person using two identifiers.   Consent: I discussed the limitations, risks, security and privacy concerns of performing an evaluation and management service by telephone and the availability of in person appointments. I also discussed with the patient that there may be a patient responsible charge related to this service. The patient expressed understanding and agreed to proceed.   Location of Patient: Home   Location of Provider: Clinic    Persons participating in Telemedicine visit: Oneta Dinneen [facility] Dr. Juleen China      History of Present Illness: Patient has a visit to establish care. She has a PMH of HTN and HLD. Had a right total knee replacement for OA. She reports that her BPs have been 110-120/80s but she wants to make sure that she is checking them correctly. Denies chest pain, headaches, vision changes.    Past Medical History:  Diagnosis Date  . Arthritis   . Hypertension    No Known Allergies  Current Outpatient Medications on File Prior to Visit  Medication Sig Dispense Refill  . amLODipine (NORVASC) 10 MG tablet Take 10 mg by mouth daily.    Marland Kitchen aspirin 325 MG tablet Take 1 tablet (325 mg total) by mouth daily. 30 tablet 0  . atorvastatin (LIPITOR) 20 MG tablet Take 20 mg by mouth at bedtime.    Marland Kitchen lisinopril (PRINIVIL,ZESTRIL) 20 MG tablet Take 20 mg by mouth daily.    . Multiple Vitamins-Minerals (MULTIVITAMIN WOMEN 50+ PO) Take 1 tablet by mouth daily.     No current facility-administered medications on file prior to visit.    Observations/Objective: NAD. Speaking clearly.  Work of breathing normal.  Alert and oriented. Mood appropriate.   Assessment and Plan: 1. Encounter to establish care Reviewed patient's PMH, social history, surgical history, and  medications.  Is overdue for annual exam, screening blood work, and health maintenance topics. Have asked patient to return for visit to address these items.   2. Essential hypertension BP seems to be at goal. Asymptomatic. Continue current medication regimen. Will monitor at upcoming annual exam.  - amLODipine (NORVASC) 10 MG tablet; Take 1 tablet (10 mg total) by mouth daily.  Dispense: 90 tablet; Refill: 0 - lisinopril (ZESTRIL) 20 MG tablet; Take 1 tablet (20 mg total) by mouth daily.  Dispense: 90 tablet; Refill: 0  3. Dizziness Had urgent care visit for this. Has now resolved. Was thought to be related to dehydration.   4. Colon cancer screening - Ambulatory referral to Gastroenterology  5. Hyperlipidemia, unspecified hyperlipidemia type Will plan to repeat lipid panel and ensure on appropriate statin intensity.  - atorvastatin (LIPITOR) 20 MG tablet; Take 1 tablet (20 mg total) by mouth at bedtime.  Dispense: 90 tablet; Refill: 0   Follow Up Instructions: Annual exam    I discussed the assessment and treatment plan with the patient. The patient was provided an opportunity to ask questions and all were answered. The patient agreed with the plan and demonstrated an understanding of the instructions.   The patient was advised to call back or seek an in-person evaluation if the symptoms worsen or if the condition fails to improve as anticipated.     I provided 14 minutes total of non-face-to-face time during this encounter including median intraservice time, reviewing previous notes, investigations, ordering medications, medical decision making,  coordinating care and patient verbalized understanding at the end of the visit.    Phill Myron, D.O. Primary Care at [facility]  08/22/2019, 9:41 AM

## 2019-09-01 ENCOUNTER — Telehealth: Payer: Self-pay | Admitting: Internal Medicine

## 2019-09-01 NOTE — Telephone Encounter (Signed)
Pt wants a nurse to call her to go over the medications she is on

## 2019-09-01 NOTE — Telephone Encounter (Signed)
Called patient medications were reviewed/ stated she used to take 5 mg of Amlodipine when first prescribed and she does not understand why and when it was it changed.According to the patient her readings heve been normal after being discharged from the ED. Prefers to stay on  5 mg of Amlodipone and not the dose prescribed by PCP( amLODipine (NORVASC) 10 MG tablet [[id number]]). Made pt aware that message will be relyed to the PCP. Instructed pt to keep a daliy log with BP readings and f /u with PCP/   Please advice !

## 2019-09-03 NOTE — Telephone Encounter (Signed)
I am not sure when the dose was changed either. At our recent encounter to establish care, Amlodipine 10 mg was listed on her medication list. We can decrease to 5 mg and monitor.   Phill Myron, D.O. Primary Care at [facility]  09/03/2019, 10:22 AM

## 2019-09-03 NOTE — Telephone Encounter (Signed)
According to the patient she had only taking 5 mg and never 10 mg of Amlodipine. Explained pt the need to f /u with you on this and keep a BP log to her next appt . Thank you

## 2019-10-10 ENCOUNTER — Telehealth: Payer: Self-pay

## 2019-10-10 NOTE — Telephone Encounter (Signed)

## 2019-10-13 ENCOUNTER — Ambulatory Visit: Payer: Medicare (Managed Care) | Admitting: Internal Medicine

## 2019-10-28 NOTE — Patient Instructions (Signed)

## 2019-10-29 ENCOUNTER — Other Ambulatory Visit (HOSPITAL_COMMUNITY)
Admission: RE | Admit: 2019-10-29 | Discharge: 2019-10-29 | Disposition: A | Discharge status: Home / Self Care | Payer: Medicare (Managed Care) | Source: Ambulatory Visit | Attending: Internal Medicine | Admitting: Internal Medicine

## 2019-10-29 ENCOUNTER — Encounter: Payer: Self-pay | Admitting: Internal Medicine

## 2019-10-29 ENCOUNTER — Ambulatory Visit (INDEPENDENT_AMBULATORY_CARE_PROVIDER_SITE_OTHER): Payer: Medicare (Managed Care) | Admitting: Internal Medicine

## 2019-10-29 ENCOUNTER — Other Ambulatory Visit: Payer: Self-pay

## 2019-10-29 VITALS — BP 138/88 | HR 83 | Temp 97.3°F | Resp 17 | Ht 60.0 in | Wt 153.0 lb

## 2019-10-29 DIAGNOSIS — I10 Essential (primary) hypertension: Secondary | ICD-10-CM | POA: Diagnosis not present

## 2019-10-29 DIAGNOSIS — Z1159 Encounter for screening for other viral diseases: Secondary | ICD-10-CM

## 2019-10-29 DIAGNOSIS — Z113 Encounter for screening for infections with a predominantly sexual mode of transmission: Secondary | ICD-10-CM

## 2019-10-29 DIAGNOSIS — Z23 Encounter for immunization: Secondary | ICD-10-CM

## 2019-10-29 DIAGNOSIS — Z1211 Encounter for screening for malignant neoplasm of colon: Secondary | ICD-10-CM | POA: Diagnosis not present

## 2019-10-29 DIAGNOSIS — Z0000 Encounter for general adult medical examination without abnormal findings: Secondary | ICD-10-CM | POA: Diagnosis not present

## 2019-10-29 MED ORDER — AMLODIPINE BESYLATE 5 MG PO TABS: 5.0000 mg | ORAL_TABLET | Freq: Every day | ORAL | 1 refills | Status: DC

## 2019-10-29 NOTE — Progress Notes (Signed)
Subjective:    Lamica Mccart - 69 y.o. female MRN [id number]  Date of birth: December 25, 1950  HPI  Kihanna Kamiya is here for annual exam. She has concerns about her BP. Home BP cuff is reading 120-130/80s. It is not a wrist cuff. She has concerns that maybe she is using it wrong due to BP measuring higher in office. She is taking Lisinopril 20 mg and Amlodipine 5 mg. No chest pain, SOB, severe headaches, vision changes.      Health Maintenance:  Health Maintenance Due  Topic Date Due  . Hepatitis C Screening  Never done  . COLONOSCOPY  Never done  . INFLUENZA VACCINE  10/26/2019    -  reports that she has never smoked. She has never used smokeless tobacco. - Review of Systems: Per HPI. - Past Medical History: Patient Active Problem List   Diagnosis Date Noted  . Hyperlipidemia 08/22/2019  . Arthritis   . Essential hypertension   . S/P left unicompartmental knee replacement 12/04/2011  . Arthritis of knee 08/30/2011   - Medications: reviewed and updated   Objective:   Physical Exam BP 138/88   Pulse 83   Temp (!) 97.3 F (36.3 C) (Temporal)   Resp 17   Ht 5' (1.524 m)   Wt 153 lb (69.4 kg)   SpO2 98%   BMI 29.88 kg/m  Physical Exam Constitutional:      Appearance: She is not diaphoretic.  HENT:     Head: Normocephalic and atraumatic.  Eyes:     Conjunctiva/sclera: Conjunctivae normal.     Pupils: Pupils are equal, round, and reactive to light.  Neck:     Thyroid: No thyromegaly.  Cardiovascular:     Rate and Rhythm: Normal rate and regular rhythm.     Heart sounds: Normal heart sounds. No murmur heard.   Pulmonary:     Effort: Pulmonary effort is normal. No respiratory distress.     Breath sounds: Normal breath sounds. No wheezing.  Abdominal:     General: Bowel sounds are normal. There is no distension.     Palpations: Abdomen is soft.     Tenderness: There is no abdominal tenderness. There is no guarding or rebound.  Musculoskeletal:        General:  No deformity. Normal range of motion.     Cervical back: Normal range of motion and neck supple.  Lymphadenopathy:     Cervical: No cervical adenopathy.  Skin:    General: Skin is warm and dry.     Findings: No rash.  Neurological:     Mental Status: She is alert and oriented to person, place, and time.     Gait: Gait is intact.  Psychiatric:        Mood and Affect: Mood and affect normal.        Judgment: Judgment normal.            Assessment & Plan:    1. Annual physical exam Counseled on 150 minutes of exercise per week, healthy eating (including decreased daily intake of saturated fats, cholesterol, added sugars, sodium), STI prevention, routine healthcare maintenance. - CBC with Differential - Comprehensive metabolic panel - Lipid Panel  2. Essential hypertension Initial BP 168/108. Repeat 138/88, much closer to goal. Will have patient return for BP check with CMA in next 1-2 weeks with her own BP cuff for comparison to help direct therapy. Continue current regimen for now.  - amLODipine (NORVASC) 5 MG tablet; Take 1 tablet (5  mg total) by mouth daily.  Dispense: 90 tablet; Refill: 1  3. Screening for colon cancer Patient reports she had a colonoscopy in 2014 that was normal. She believes she had this done at [facility] but unable to find any record of this in EMR. Will request records from prior PCP.   4. Screening for STDs (sexually transmitted diseases) - Cervicovaginal ancillary only - HIV antibody (with reflex) - RPR  5. Need for hepatitis C screening test - HCV Ab w/Rflx to Verification  6. Need for pneumococcal vaccine - Pneumococcal conjugate vaccine 13-valent     Phill Myron, D.O. 10/29/2019, 11:15 AM Primary Care at [facility]

## 2019-10-30 ENCOUNTER — Other Ambulatory Visit: Payer: Self-pay | Admitting: Internal Medicine

## 2019-10-30 LAB — COMPREHENSIVE METABOLIC PANEL
ALT: 22 IU/L (ref 0–32)
AST: 24 IU/L (ref 0–40)
Albumin/Globulin Ratio: 1.8 (ref 1.2–2.2)
Albumin: 4.7 g/dL (ref 3.8–4.8)
Alkaline Phosphatase: 129 IU/L — ABNORMAL HIGH (ref 48–121)
BUN/Creatinine Ratio: 13 (ref 12–28)
BUN: 13 mg/dL (ref 8–27)
Bilirubin Total: 0.5 mg/dL (ref 0.0–1.2)
CO2: 22 mmol/L (ref 20–29)
Calcium: 11 mg/dL — ABNORMAL HIGH (ref 8.7–10.3)
Chloride: 105 mmol/L (ref 96–106)
Creatinine, Ser: 1.01 mg/dL — ABNORMAL HIGH (ref 0.57–1.00)
GFR calc Af Amer: 66 mL/min/{1.73_m2} (ref >59)
GFR calc non Af Amer: 57 mL/min/{1.73_m2} — ABNORMAL LOW (ref >59)
Globulin, Total: 2.6 g/dL (ref 1.5–4.5)
Glucose: 94 mg/dL (ref 65–99)
Potassium: 3.9 mmol/L (ref 3.5–5.2)
Sodium: 141 mmol/L (ref 134–144)
Total Protein: 7.3 g/dL (ref 6.0–8.5)

## 2019-10-30 LAB — CBC WITH DIFFERENTIAL/PLATELET
Basophils Absolute: 0 10*3/uL (ref 0.0–0.2)
Basos: 1 %
EOS (ABSOLUTE): 0.1 10*3/uL (ref 0.0–0.4)
Eos: 2 %
Hematocrit: 41.2 % (ref 34.0–46.6)
Hemoglobin: 14 g/dL (ref 11.1–15.9)
Immature Grans (Abs): 0 10*3/uL (ref 0.0–0.1)
Immature Granulocytes: 0 %
Lymphocytes Absolute: 1.3 10*3/uL (ref 0.7–3.1)
Lymphs: 30 %
MCH: 30.8 pg (ref 26.6–33.0)
MCHC: 34 g/dL (ref 31.5–35.7)
MCV: 91 fL (ref 79–97)
Monocytes Absolute: 0.3 10*3/uL (ref 0.1–0.9)
Monocytes: 8 %
Neutrophils Absolute: 2.6 10*3/uL (ref 1.4–7.0)
Neutrophils: 59 %
Platelets: 198 10*3/uL (ref 150–450)
RBC: 4.55 x10E6/uL (ref 3.77–5.28)
RDW: 12.1 % (ref 11.7–15.4)
WBC: 4.3 10*3/uL (ref 3.4–10.8)

## 2019-10-30 LAB — CERVICOVAGINAL ANCILLARY ONLY
Bacterial Vaginitis (gardnerella): POSITIVE — AB
Candida Glabrata: NEGATIVE
Candida Vaginitis: POSITIVE — AB
Chlamydia: NEGATIVE
Comment: NEGATIVE
Comment: NEGATIVE
Comment: NEGATIVE
Comment: NEGATIVE
Comment: NEGATIVE
Comment: NORMAL
Neisseria Gonorrhea: NEGATIVE
Trichomonas: NEGATIVE

## 2019-10-30 LAB — HCV AB W/RFLX TO VERIFICATION: HCV Ab: <0.1 s/co ratio (ref 0.0–0.9)

## 2019-10-30 LAB — LIPID PANEL
Chol/HDL Ratio: 2.3 ratio (ref 0.0–4.4)
Cholesterol, Total: 138 mg/dL (ref 100–199)
HDL: 61 mg/dL (ref >39)
LDL Chol Calc (NIH): 66 mg/dL (ref 0–99)
Triglycerides: 52 mg/dL (ref 0–149)
VLDL Cholesterol Cal: 11 mg/dL (ref 5–40)

## 2019-10-30 LAB — HIV ANTIBODY (ROUTINE TESTING W REFLEX): HIV Screen 4th Generation wRfx: NONREACTIVE

## 2019-10-30 LAB — HCV INTERPRETATION

## 2019-10-30 LAB — RPR: RPR Ser Ql: NONREACTIVE

## 2019-10-30 MED ORDER — METRONIDAZOLE 500 MG PO TABS
500.0000 mg | ORAL_TABLET | Freq: Two times a day (BID) | ORAL | 0 refills | Status: DC
Start: 2019-10-30 — End: 2020-02-16

## 2019-10-30 MED ORDER — FLUCONAZOLE 150 MG PO TABS: ORAL_TABLET | ORAL | 0 refills | Status: DC

## 2019-10-31 NOTE — Progress Notes (Signed)
Patient notified of results & recommendations. Expressed understanding.

## 2019-11-12 ENCOUNTER — Ambulatory Visit (INDEPENDENT_AMBULATORY_CARE_PROVIDER_SITE_OTHER): Payer: Medicare (Managed Care)

## 2019-11-12 ENCOUNTER — Other Ambulatory Visit: Payer: Self-pay

## 2019-11-12 VITALS — BP 144/90 | HR 82

## 2019-11-12 DIAGNOSIS — I10 Essential (primary) hypertension: Secondary | ICD-10-CM

## 2019-11-12 NOTE — Progress Notes (Signed)
Patient here for BP check. Did bring her BP cuff with her. After sitting BP was 144/90, pulse 82. With her cuff BP was 138/86, pulse was 71. Spoke with provider who states to have her follow up in 3 months. KWalker, CMA.

## 2020-01-29 DIAGNOSIS — Z96651 Presence of right artificial knee joint: Secondary | ICD-10-CM | POA: Insufficient documentation

## 2020-01-30 ENCOUNTER — Other Ambulatory Visit: Payer: Self-pay

## 2020-01-30 DIAGNOSIS — E785 Hyperlipidemia, unspecified: Secondary | ICD-10-CM

## 2020-01-30 DIAGNOSIS — I10 Essential (primary) hypertension: Secondary | ICD-10-CM

## 2020-01-30 MED ORDER — ATORVASTATIN CALCIUM 20 MG PO TABS: 20.0000 mg | ORAL_TABLET | Freq: Every day | ORAL | 0 refills | Status: DC

## 2020-01-30 MED ORDER — LISINOPRIL 20 MG PO TABS: 20.0000 mg | ORAL_TABLET | Freq: Every day | ORAL | 0 refills | Status: DC

## 2020-02-16 ENCOUNTER — Encounter: Payer: Self-pay | Admitting: Internal Medicine

## 2020-02-16 ENCOUNTER — Telehealth (INDEPENDENT_AMBULATORY_CARE_PROVIDER_SITE_OTHER): Payer: Medicare (Managed Care) | Admitting: Internal Medicine

## 2020-02-16 ENCOUNTER — Ambulatory Visit: Payer: Medicare (Managed Care) | Admitting: Internal Medicine

## 2020-02-16 DIAGNOSIS — I10 Essential (primary) hypertension: Secondary | ICD-10-CM | POA: Diagnosis not present

## 2020-02-16 NOTE — Progress Notes (Signed)
Virtual Visit via Telephone Note  I connected with April Randall, on 02/16/2020 at 1:38 PM by telephone due to the COVID-19 pandemic and verified that I am speaking with the correct person using two identifiers.   Consent: I discussed the limitations, risks, security and privacy concerns of performing an evaluation and management service by telephone and the availability of in person appointments. I also discussed with the patient that there may be a patient responsible charge related to this service. The patient expressed understanding and agreed to proceed.   Location of Patient: Home   Location of Provider: Clinic    Persons participating in Telemedicine visit: Tequia Wolman [facility] Dr. Juleen China      History of Present Illness: Patient has a visit to follow up on HTN.   Chronic HTN Disease Monitoring:  Home BP Monitoring - 120/80s  Chest pain- no  Dyspnea- no Headache - no  Medications: Amlodipine 5 mg, Lisinopril 20 mg  Compliance- yes Lightheadedness- no  Edema- no   Patient reports she doesn't trust her machine because her BP is always higher at office visits than it is at home. Although her last visit was with orthopedics when she had knee pain. Plans to buy a new one next week.       Past Medical History:  Diagnosis Date  . Arthritis   . Hypertension    No Known Allergies  Current Outpatient Medications on File Prior to Visit  Medication Sig Dispense Refill  . amLODipine (NORVASC) 5 MG tablet Take 1 tablet (5 mg total) by mouth daily. 90 tablet 1  . aspirin EC 81 MG tablet Take 81 mg by mouth daily. Swallow whole.    Marland Kitchen atorvastatin (LIPITOR) 20 MG tablet Take 1 tablet (20 mg total) by mouth at bedtime. 90 tablet 0  . lisinopril (ZESTRIL) 20 MG tablet Take 1 tablet (20 mg total) by mouth daily. 90 tablet 0  . Multiple Vitamins-Minerals (MULTIVITAMIN WOMEN 50+ PO) Take 1 tablet by mouth daily.     No current  facility-administered medications on file prior to visit.    Observations/Objective: NAD. Speaking clearly.  Work of breathing normal.  Alert and oriented. Mood appropriate.   Assessment and Plan: 1. Essential hypertension At goal with home cuff. In past, patient has presented with her cuff for comparison and was in similar range to office cuff. Of note, BP does tend to run higher in the office than home measurements. Could consider small dose increase of Lisinopril or Amlodipine in the future. Patient asymptomatic. She would like to get a new cuff for comparison prior to making any changes to regimen.    Follow Up Instructions: 3 month f/u HTN   I discussed the assessment and treatment plan with the patient. The patient was provided an opportunity to ask questions and all were answered. The patient agreed with the plan and demonstrated an understanding of the instructions.   The patient was advised to call back or seek an in-person evaluation if the symptoms worsen or if the condition fails to improve as anticipated.     I provided 10 minutes total of non-face-to-face time during this encounter including median intraservice time, reviewing previous notes, investigations, ordering medications, medical decision making, coordinating care and patient verbalized understanding at the end of the visit.    Phill Myron, D.O. Primary Care at [facility]  02/16/2020, 1:38 PM

## 2020-04-21 ENCOUNTER — Other Ambulatory Visit: Payer: Self-pay | Admitting: Internal Medicine

## 2020-04-21 DIAGNOSIS — I10 Essential (primary) hypertension: Secondary | ICD-10-CM

## 2020-05-12 ENCOUNTER — Other Ambulatory Visit: Payer: Self-pay | Admitting: Internal Medicine

## 2020-05-12 DIAGNOSIS — Z1231 Encounter for screening mammogram for malignant neoplasm of breast: Secondary | ICD-10-CM

## 2020-06-09 ENCOUNTER — Other Ambulatory Visit: Payer: Self-pay

## 2020-06-09 ENCOUNTER — Encounter: Payer: Self-pay | Admitting: Internal Medicine

## 2020-06-09 ENCOUNTER — Ambulatory Visit (INDEPENDENT_AMBULATORY_CARE_PROVIDER_SITE_OTHER): Payer: Medicare Other | Admitting: Internal Medicine

## 2020-06-09 VITALS — BP 124/85 | HR 94 | Resp 16 | Ht <= 58 in | Wt 154.0 lb

## 2020-06-09 DIAGNOSIS — E669 Obesity, unspecified: Secondary | ICD-10-CM | POA: Diagnosis not present

## 2020-06-09 DIAGNOSIS — Z0289 Encounter for other administrative examinations: Secondary | ICD-10-CM

## 2020-06-09 DIAGNOSIS — Z0000 Encounter for general adult medical examination without abnormal findings: Secondary | ICD-10-CM

## 2020-06-09 DIAGNOSIS — Z1211 Encounter for screening for malignant neoplasm of colon: Secondary | ICD-10-CM

## 2020-06-09 NOTE — Patient Instructions (Signed)

## 2020-06-09 NOTE — Progress Notes (Addendum)
Subjective:    April Randall - 70 y.o. female MRN [id number]  Date of birth: 1950-11-25  HPI  April Randall is here for annual exam. Asks for FMLA paperwork for her daughter to be able to bring her to her doctor's visits or to care for her if she becomes acutely ill.    MMSE - Mini Mental State Exam 06/09/2020  Orientation to time 5  Orientation to Place 5  Registration 3  Attention/ Calculation 5  Recall 3  Language- name 2 objects 2  Language- repeat 1  Language- follow 3 step command 3  Language- read & follow direction 1  Write a sentence 1  Copy design 0  Total score 29   GAD 7 : Generalized Anxiety Score 06/09/2020  Nervous, Anxious, on Edge 0  Control/stop worrying 0  Worry too much - different things 0  Trouble relaxing 0  Restless 0  Easily annoyed or irritable 0  Afraid - awful might happen 0  Total GAD 7 Score 0    Depression screen [facility] 2/9 06/09/2020 08/22/2019 11/18/2016  Decreased Interest 0 0 0  Down, Depressed, Hopeless 0 0 0  PHQ - 2 Score 0 0 0  Altered sleeping 0 - -  Tired, decreased energy 0 - -  Change in appetite 0 - -  Feeling bad or failure about yourself  0 - -  Trouble concentrating 0 - -  Moving slowly or fidgety/restless 0 - -  Suicidal thoughts 0 - -  PHQ-9 Score 0 - -    Hearing Screening   Method: Audiometry   125Hz  250Hz  500Hz  1000Hz  2000Hz  3000Hz  4000Hz  6000Hz  8000Hz   Right ear:   40  40  40    Left ear:   20 20 20  20       Visual Acuity Screening   Right eye Left eye Both eyes  Without correction:     With correction: 20/20 20/30 20/25      Fall Risk  06/09/2020 10/29/2019 11/18/2016 12/07/2014  Falls in the past year? 0 0 No No  Number falls in past yr: - 0 - -  Injury with Fall? - 0 - -   Physical Activity: Not on file    Health Maintenance:  Health Maintenance Due  Topic Date Due  . COLONOSCOPY (Pts 45-2yrs Insurance coverage will need to be confirmed)  Never done  . COVID-19 Vaccine (3 - Booster for Pfizer series)  12/25/2019    -  reports that she has never smoked. She has never used smokeless tobacco. - Review of Systems: Per HPI. - Past Medical History: Patient Active Problem List   Diagnosis Date Noted  . History of right knee joint replacement 01/29/2020  . Hyperlipidemia 08/22/2019  . Arthritis   . Essential hypertension   . S/P left unicompartmental knee replacement 12/04/2011  . Arthritis of knee 08/30/2011   - Medications: reviewed and updated   Objective:   Physical Exam BP 124/85 (BP Location: Left Arm, Patient Position: Sitting, Cuff Size: Large)   Pulse 94   Resp 16   Ht 4\' 10"  (1.473 m)   Wt 154 lb (69.9 kg)   SpO2 96%   BMI 32.19 kg/m  Physical Exam Constitutional:      Appearance: She is not diaphoretic.  HENT:     Head: Normocephalic and atraumatic.  Eyes:     Conjunctiva/sclera: Conjunctivae normal.     Pupils: Pupils are equal, round, and reactive to light.  Neck:     Thyroid:  No thyromegaly.  Cardiovascular:     Rate and Rhythm: Normal rate and regular rhythm.     Heart sounds: Normal heart sounds. No murmur heard.   Pulmonary:     Effort: Pulmonary effort is normal. No respiratory distress.     Breath sounds: Normal breath sounds. No wheezing.  Abdominal:     General: Bowel sounds are normal. There is no distension.     Palpations: Abdomen is soft.     Tenderness: There is no abdominal tenderness. There is no guarding or rebound.  Musculoskeletal:        General: No deformity. Normal range of motion.     Cervical back: Normal range of motion and neck supple.  Lymphadenopathy:     Cervical: No cervical adenopathy.  Skin:    General: Skin is warm and dry.     Findings: No rash.  Neurological:     Mental Status: She is alert and oriented to person, place, and time.     Gait: Gait is intact.  Psychiatric:        Mood and Affect: Mood and affect normal.        Judgment: Judgment normal.            Assessment & Plan:   1. Encounter for  annual physical exam Counseled on 150 minutes of exercise per week, healthy eating (including decreased daily intake of saturated fats, cholesterol, added sugars, sodium), STI prevention, routine healthcare maintenance.  2. Class 1 obesity with serious comorbidity in adult, unspecified BMI, unspecified obesity type We discussed diet, particularly DASH diet, given co-morbidity of HTN. Encouraged daily compliance with at least 30 minute walking regimen.   3. Screening for colon cancer Patient reports she had normal colonoscopy, she believes in 2014. However, no record available. She believes she has records at home. She will attempt to find and bring to clinic for documentation.   4. Encounter for completion of form with patient FMLA paperwork will be completed for patient's daughter to accompany her to medical visits and for acute illnesses prn.     Phill Myron, D.O. 06/09/2020, 3:16 PM Primary Care at [facility]

## 2020-06-29 ENCOUNTER — Telehealth: Payer: Self-pay | Admitting: Internal Medicine

## 2020-06-29 NOTE — Telephone Encounter (Signed)
Pt is calling regarding the FMLA forms that she needed filled out. Please advise and follow up with her about this issue. Her Cell is [phone].

## 2020-07-01 ENCOUNTER — Inpatient Hospital Stay: Admission: RE | Admit: 2020-07-01 | Payer: Medicare (Managed Care) | Source: Ambulatory Visit

## 2020-07-02 NOTE — Telephone Encounter (Signed)
Late entry. Spoke to patient on yesterday, 07/01/2020 as was informed that patient came to the office.  Called patient and informed patient that paperwork was not able to be found. Advised to have paperwork faxed to office. Given fax number to office.  Patient verbalized understanding.

## 2020-08-09 ENCOUNTER — Telehealth: Payer: Self-pay | Admitting: Internal Medicine

## 2020-08-09 NOTE — Telephone Encounter (Signed)
Pt states she is out of her atorvastatin (LIPITOR) 20 MG tablet. [facility] [address], [place], [place] [address] Phone: [phone]

## 2020-08-10 ENCOUNTER — Other Ambulatory Visit: Payer: Self-pay | Admitting: *Deleted

## 2020-08-10 DIAGNOSIS — E785 Hyperlipidemia, unspecified: Secondary | ICD-10-CM

## 2020-08-10 MED ORDER — ATORVASTATIN CALCIUM 20 MG PO TABS: 20.0000 mg | ORAL_TABLET | Freq: Every day | ORAL | 0 refills | Status: DC

## 2020-08-19 ENCOUNTER — Other Ambulatory Visit: Payer: Self-pay

## 2020-08-19 ENCOUNTER — Telehealth: Payer: Self-pay | Admitting: Internal Medicine

## 2020-08-19 ENCOUNTER — Ambulatory Visit
Admission: RE | Admit: 2020-08-19 | Discharge: 2020-08-19 | Disposition: A | Discharge status: Home / Self Care | Payer: Medicare Other | Source: Ambulatory Visit | Attending: Internal Medicine | Admitting: Internal Medicine

## 2020-08-19 DIAGNOSIS — Z1231 Encounter for screening mammogram for malignant neoplasm of breast: Secondary | ICD-10-CM

## 2020-08-19 DIAGNOSIS — I10 Essential (primary) hypertension: Secondary | ICD-10-CM

## 2020-08-19 MED ORDER — AMLODIPINE BESYLATE 5 MG PO TABS: 5.0000 mg | ORAL_TABLET | Freq: Every day | ORAL | 1 refills | Status: DC

## 2020-08-19 NOTE — Telephone Encounter (Signed)
Pt called in stating she ran out of her amLODipine (NORVASC) 5 MG tablet.   Pharmacy  [id number] [address], [place]  [place], [place] [address]  Phone:  [phone] Fax:  [phone]  DEA #:  [id number]

## 2020-09-07 ENCOUNTER — Other Ambulatory Visit: Payer: Self-pay

## 2020-09-07 ENCOUNTER — Ambulatory Visit (HOSPITAL_COMMUNITY)
Admission: EM | Admit: 2020-09-07 | Discharge: 2020-09-07 | Disposition: A | Discharge status: Home / Self Care | Payer: Medicare Other | Attending: Emergency Medicine | Admitting: Emergency Medicine

## 2020-09-07 ENCOUNTER — Encounter (HOSPITAL_COMMUNITY): Payer: Self-pay | Admitting: Emergency Medicine

## 2020-09-07 DIAGNOSIS — Z1152 Encounter for screening for COVID-19: Secondary | ICD-10-CM | POA: Insufficient documentation

## 2020-09-07 DIAGNOSIS — J069 Acute upper respiratory infection, unspecified: Secondary | ICD-10-CM | POA: Diagnosis not present

## 2020-09-07 LAB — SARS CORONAVIRUS 2 (TAT 6-24 HRS): SARS Coronavirus 2: POSITIVE — AB

## 2020-09-07 NOTE — ED Triage Notes (Signed)
Patient presents to [facility] for evaluation of headache starting two days ago, relieved with tylenol, but keeps coming back.  Also c/o fatigue.  C/o tightness in her throat, denies cough, nasal congestion.  States daughter tested positive for COVID, last encounter was 4-5 days ago.

## 2020-09-07 NOTE — ED Provider Notes (Signed)
[place]    CSN: [id number] Arrival date & time: 09/07/20  1043      History   Chief Complaint Chief Complaint  Patient presents with   Headache   Fatigue    HPI Merary Garguilo is a 70 y.o. female presents urgent care today with complaints of headache and fatigue.  Patient states she awoke 3 days prior with headache, sore throat and body aches.  Last took Tylenol this morning just prior to arrival.  She denies recent dizziness, cough, chest pain, shortness of breath, abdominal pain, N/V/D.  Of note, patient lives with daughter who tested positive for COVID this week.   Past Medical History:  Diagnosis Date   Arthritis    Hypertension     Patient Active Problem List   Diagnosis Date Noted   History of right knee joint replacement 01/29/2020   Hyperlipidemia 08/22/2019   Arthritis    Essential hypertension    S/P left unicompartmental knee replacement 12/04/2011   Arthritis of knee 08/30/2011    Past Surgical History:  Procedure Laterality Date   JOINT REPLACEMENT N/A    [employer]rees[employer] 08/20/2019   REPLACEMENT TOTAL KNEE Right     OB History   No obstetric history on file.      Home Medications    Prior to Admission medications   Medication Sig Start Date End Date Taking? Authorizing Provider  amLODipine (NORVASC) 5 MG tablet Take 1 tablet (5 mg total) by mouth daily. 08/19/20   Nicolette Bang, MD  aspirin EC 81 MG tablet Take 81 mg by mouth daily. Swallow whole.    [provider]  atorvastatin (LIPITOR) 20 MG tablet Take 1 tablet (20 mg total) by mouth at bedtime. 08/10/20   Nicolette Bang, MD  lisinopril (ZESTRIL) 20 MG tablet TAKE 1 TABLET DAILY 04/22/20   Nicolette Bang, MD  Multiple Vitamins-Minerals (MULTIVITAMIN WOMEN 50+ PO) Take 1 tablet by mouth daily. Patient not taking: Reported on 06/09/2020    [provider]    Family History Family History  Problem Relation Age of Onset    Hypertension Mother    Breast cancer Neg Hx     Social History Social History   Tobacco Use   Smoking status: Never   Smokeless tobacco: Never  Substance Use Topics   Alcohol use: No    Alcohol/week: 0.0 standard drinks   Drug use: No     Allergies   Patient has no known allergies.   Review of Systems As stated in HPI otherwise negative   Physical Exam Triage Vital Signs ED Triage Vitals [09/07/20 1245]  Enc Vitals Group     BP 133/88     Pulse Rate 85     Resp 18     Temp 99.3 F (37.4 C)     Temp Source Oral     SpO2 100 %     Weight      Height      Head Circumference      Peak Flow      Pain Score 3     Pain Loc      Pain Edu?      Excl. in [place]?    No data found.  Updated Vital Signs BP 133/88 (BP Location: Right Arm)   Pulse 85   Temp 99.3 F (37.4 C) (Oral) Comment: Tylenol this morning before coming  Resp 18   SpO2 100%   Visual Acuity Right Eye Distance:   Left  Eye Distance:   Bilateral Distance:    Right Eye Near:   Left Eye Near:    Bilateral Near:     Physical Exam Constitutional:      General: She is not in acute distress.    Appearance: She is well-developed. She is not ill-appearing or toxic-appearing.  HENT:     Head:     Comments: Tympanic membranes clear without erythema or bulging.  No middle ear effusion.    Mouth/Throat:     Mouth: Mucous membranes are moist.     Pharynx: Oropharynx is clear.  Eyes:     General: No scleral icterus.    Extraocular Movements: Extraocular movements intact.  Cardiovascular:     Rate and Rhythm: Normal rate and regular rhythm.     Heart sounds: No murmur heard.   No friction rub. No gallop.  Pulmonary:     Effort: Pulmonary effort is normal.     Breath sounds: Normal breath sounds. No wheezing, rhonchi or rales.  Abdominal:     General: Bowel sounds are normal.     Palpations: Abdomen is soft.  Musculoskeletal:        General: Normal range of motion.     Cervical back: Normal range  of motion and neck supple. No rigidity.  Lymphadenopathy:     Cervical: No cervical adenopathy.  Skin:    General: Skin is warm and dry.  Neurological:     Mental Status: She is alert.  Psychiatric:        Mood and Affect: Mood normal.        Speech: Speech normal.        Behavior: Behavior normal.     UC Treatments / Results  Labs (all labs ordered are listed, but only abnormal results are displayed) Labs Reviewed  SARS CORONAVIRUS 2 (TAT 6-24 HRS)    EKG   Radiology No results found.  Procedures Procedures (including critical care time)  Medications Ordered in UC Medications - No data to display  Initial Impression / Assessment and Plan / UC Course  I have reviewed the triage vital signs and the nursing notes.  Pertinent labs & imaging results that were available during my care of the patient were reviewed by me and considered in my medical decision making (see chart for details).  Viral URI Headache  -COVID-19 given known close exposure and current symptoms. Pt Non-toxic appearing, VSS, exam unremarkable -covid PCR sent with instruction on strict isolation and f/u precautions -symptomatic tx with tylenol/motrin  Reviewed expections re: course of current medical issues. Questions answered. Outlined signs and symptoms indicating need for more acute intervention. Pt verbalized understanding. AVS given  Final Clinical Impressions(s) / UC Diagnoses   Final diagnoses:  Viral URI  Encounter for screening for COVID-19     Discharge Instructions      I am concerned you have given recent close exposure.  Continue isolating until results available in the next 12 to 24 hours.  If COVID results are positive you will need to continue isolation from 5 days from symptom onset.  You may end isolation at that time as long as you are fever free without any You can access your results through MyChart as we discussed.  In the meantime...  Get plenty of rest and  fluids Flonase for nasal congestion and/or runny nose You can take OTC Zyrtec-D for nasal congestion, runny nose, and/or sore throat Use these medications as directed for symptom relief Use Tylenol or Ibuprofen as  needed for fever or pain Return or go to the ER for any worsening or new symptoms such as high fever, worsening cough, shortness of breath, chest tightness, chest pain, changes in mental status, etc.      ED Prescriptions   None    PDMP not reviewed this encounter.   Rudolpho Sevin, NP 09/07/20 1359

## 2020-09-07 NOTE — Discharge Instructions (Addendum)
I am concerned you have given recent close exposure.  Continue isolating until results available in the next 12 to 24 hours.  If COVID results are positive you will need to continue isolation from 5 days from symptom onset.  You may end isolation at that time as long as you are fever free without any You can access your results through MyChart as we discussed.  In the meantime...  Get plenty of rest and fluids Flonase for nasal congestion and/or runny nose You can take OTC Zyrtec-D for nasal congestion, runny nose, and/or sore throat Use these medications as directed for symptom relief Use Tylenol or Ibuprofen as needed for fever or pain Return or go to the ER for any worsening or new symptoms such as high fever, worsening cough, shortness of breath, chest tightness, chest pain, changes in mental status, etc.

## 2020-09-09 ENCOUNTER — Other Ambulatory Visit: Payer: Self-pay | Admitting: Family

## 2020-09-09 ENCOUNTER — Telehealth: Payer: Self-pay | Admitting: Internal Medicine

## 2020-09-09 DIAGNOSIS — R059 Cough, unspecified: Secondary | ICD-10-CM

## 2020-09-09 DIAGNOSIS — U071 COVID-19: Secondary | ICD-10-CM

## 2020-09-09 MED ORDER — BENZONATATE 100 MG PO CAPS: 100.0000 mg | ORAL_CAPSULE | Freq: Three times a day (TID) | ORAL | 1 refills | Status: DC | PRN

## 2020-09-09 NOTE — Telephone Encounter (Signed)
Called and informed patient. 

## 2020-09-09 NOTE — Telephone Encounter (Signed)
Pt called stating she was seen at Urgent Care on 6.14.2022 and now has a cough. Pt is asking if a cough medication can be called in to [facility]acy  [id number] [address], [place]  [place], [place] [address]  Phone:  [phone]  Fax:  [phone]  DEA #:  [id number]

## 2020-09-09 NOTE — Telephone Encounter (Signed)
Per patient request Benzonatate capsules (Tessalon Perles) prescribed.   Report to Emergency Department or Urgent Care if symptoms worsen or new problems develop.

## 2020-10-14 ENCOUNTER — Other Ambulatory Visit: Payer: Self-pay

## 2020-10-14 ENCOUNTER — Telehealth: Payer: Self-pay | Admitting: Internal Medicine

## 2020-10-14 DIAGNOSIS — I10 Essential (primary) hypertension: Secondary | ICD-10-CM

## 2020-10-14 DIAGNOSIS — E785 Hyperlipidemia, unspecified: Secondary | ICD-10-CM

## 2020-10-14 MED ORDER — LISINOPRIL 20 MG PO TABS
20.0000 mg | ORAL_TABLET | Freq: Every day | ORAL | 0 refills | Status: DC
Start: 2020-10-14 — End: 2020-11-10

## 2020-10-14 MED ORDER — ATORVASTATIN CALCIUM 20 MG PO TABS: 20.0000 mg | ORAL_TABLET | Freq: Every day | ORAL | 0 refills | Status: DC

## 2020-10-14 MED ORDER — AMLODIPINE BESYLATE 5 MG PO TABS: 5.0000 mg | ORAL_TABLET | Freq: Every day | ORAL | 1 refills | Status: DC

## 2020-10-14 NOTE — Telephone Encounter (Signed)
Pt requesting med refills for   amLODipine (NORVASC) 5 MG tablet [[id number]]   lisinopril (ZESTRIL) 20 MG tablet [[id number]]  atorvastatin (LIPITOR) 20 MG tablet [[id number]]   Pharmacy  [id number] [address] [place], [place] [address]  Phone:  [phone]  Fax:  [phone]   Please advise and thank you

## 2020-11-10 ENCOUNTER — Other Ambulatory Visit: Payer: Self-pay | Admitting: *Deleted

## 2020-11-10 ENCOUNTER — Telehealth: Payer: Self-pay | Admitting: Family

## 2020-11-10 DIAGNOSIS — E785 Hyperlipidemia, unspecified: Secondary | ICD-10-CM

## 2020-11-10 DIAGNOSIS — I10 Essential (primary) hypertension: Secondary | ICD-10-CM

## 2020-11-10 MED ORDER — AMLODIPINE BESYLATE 5 MG PO TABS: 5.0000 mg | ORAL_TABLET | Freq: Every day | ORAL | 0 refills | Status: DC

## 2020-11-10 MED ORDER — LISINOPRIL 20 MG PO TABS: 20.0000 mg | ORAL_TABLET | Freq: Every day | ORAL | 0 refills | Status: DC

## 2020-11-10 MED ORDER — ATORVASTATIN CALCIUM 20 MG PO TABS: 20.0000 mg | ORAL_TABLET | Freq: Every day | ORAL | 0 refills | Status: DC

## 2020-11-10 NOTE — Telephone Encounter (Signed)
Pt ran out of meds as of yesterday and needed a refill please assist

## 2020-12-09 NOTE — Progress Notes (Signed)
Patient ID: April Randall, female    DOB: 09/03/1950  MRN: [id number]  CC: Hypertension Follow-Up  Subjective: April Randall is a 70 y.o. female who presents for hypertension follow-up.   Her concerns today include:   HYPERTENSION FOLLOW-UP: 02/16/2020 per DO note: At goal with home cuff. In past, patient has presented with her cuff for comparison and was in similar range to office cuff. Of note, BP does tend to run higher in the office than home measurements. Could consider small dose increase of Lisinopril or Amlodipine in the future. Patient asymptomatic. She would like to get a new cuff for comparison prior to making any changes to regimen.   12/13/2020: Currently taking: see medication list Med Adherence: '[x]'$  Yes    '[]'$  No Medication side effects: '[]'$  Yes    '[x]'$  No Adherence with salt restriction (low-salt diet): '[x]'$  Yes    '[]'$  No Exercise: trying SOB? '[]'$  Yes    '[x]'$  No Chest Pain?: '[]'$  Yes    '[x]'$  No Comments: Requesting daily multivitamin. Requesting refills of Aspirin.  2. HYPERLIPIDEMIA FOLLOW-UP: Requesting refills of Atorvastatin.  Patient Active Problem List   Diagnosis Date Noted   History of right knee joint replacement 01/29/2020   Hyperlipidemia 08/22/2019   Arthritis    Essential hypertension    S/P left unicompartmental knee replacement 12/04/2011   Arthritis of knee 08/30/2011     Current Outpatient Medications on File Prior to Visit  Medication Sig Dispense Refill   benzonatate (TESSALON PERLES) 100 MG capsule Take 1 capsule (100 mg total) by mouth 3 (three) times daily as needed for cough. 20 capsule 1   lidocaine (XYLOCAINE) 2 % solution SMARTSIG:By Mouth     No current facility-administered medications on file prior to visit.    No Known Allergies  Social History   Socioeconomic History   Marital status: Single    Spouse name: Not on file   Number of children: Not on file   Years of education: Not on file   Highest education level: Not on  file  Occupational History   Not on file  Tobacco Use   Smoking status: Never   Smokeless tobacco: Never  Substance and Sexual Activity   Alcohol use: No    Alcohol/week: 0.0 standard drinks   Drug use: No   Sexual activity: Not on file  Other Topics Concern   Not on file  Social History Narrative   Not on file   Social Determinants of Health   Financial Resource Strain: Not on file  Food Insecurity: Not on file  Transportation Needs: Not on file  Physical Activity: Not on file  Stress: Not on file  Social Connections: Not on file  Intimate Partner Violence: Not on file    Family History  Problem Relation Age of Onset   Hypertension Mother    Breast cancer Neg Hx     Past Surgical History:  Procedure Laterality Date   JOINT REPLACEMENT N/A    [employer]reesia 08/20/2019   REPLACEMENT TOTAL KNEE Right     ROS: Review of Systems Negative except as stated above  PHYSICAL EXAM: BP 135/87 (BP Location: Left Arm, Patient Position: Sitting, Cuff Size: Normal)   Pulse 93   Temp 98.4 F (36.9 C)   Resp 18   Ht 4' 9.99" (1.473 m)   Wt 148 lb 9.6 oz (67.4 kg)   SpO2 98%   BMI 31.07 kg/m   Physical Exam HENT:     Head: Normocephalic and  atraumatic.  Eyes:     Extraocular Movements: Extraocular movements intact.     Conjunctiva/sclera: Conjunctivae normal.     Pupils: Pupils are equal, round, and reactive to light.  Cardiovascular:     Rate and Rhythm: Normal rate and regular rhythm.     Pulses: Normal pulses.     Heart sounds: Normal heart sounds.  Pulmonary:     Effort: Pulmonary effort is normal.     Breath sounds: Normal breath sounds.  Musculoskeletal:     Cervical back: Normal range of motion and neck supple.  Neurological:     General: No focal deficit present.     Mental Status: She is alert and oriented to person, place, and time.  Psychiatric:        Mood and Affect: Mood normal.        Behavior: Behavior normal.   ASSESSMENT AND PLAN: 1. Essential  hypertension: - Continue Amlodipine and Lisinopril as prescribed.  - Counseled on blood pressure goal of less than 140/90, low-sodium, DASH diet, medication compliance, 150 minutes of moderate intensity exercise per week as tolerated. Discussed medication compliance, adverse effects. - BMP to evaluate kidney function and electrolyte balance. - Follow-up with primary provider in 3 months or sooner if needed.  - Basic Metabolic Panel - amLODipine (NORVASC) 5 MG tablet; Take 1 tablet (5 mg total) by mouth daily. Must have office visit for additional refills  Dispense: 90 tablet; Refill: 0 - lisinopril (ZESTRIL) 20 MG tablet; Take 1 tablet (20 mg total) by mouth daily. Must have office visit for additional refills  Dispense: 90 tablet; Refill: 0  2. Hyperlipidemia, unspecified hyperlipidemia type: -Practice low-fat heart healthy diet and at least 150 minutes of moderate intensity exercise weekly as tolerated.  - Continue Atorvastatin as prescribed.  - Lipid panel to screen for level of cholesterol control.  - Follow-up with primary provider as scheduled.  - atorvastatin (LIPITOR) 20 MG tablet; Take 1 tablet (20 mg total) by mouth at bedtime. Must have office visit for additional refills  Dispense: 120 tablet; Refill: 0 - Lipid Panel  3. Hx of long term use of blood thinners: 4. Encounter for long-term (current) use of NSAIDs: - Continue Aspirin as prescribed.  - Follow-up with primary provider as scheduled.  - aspirin EC 81 MG tablet; Take 1 tablet (81 mg total) by mouth daily. Swallow whole.  Dispense: 120 tablet; Refill: 0  5. Vitamin deficiency: - Multiple Vitamins-Minerals as prescribed.  - Follow-up with primary provider as scheduled.  - Multiple Vitamins-Minerals (MULTIVITAMIN WOMEN 50+) TABS; Take 1 tablet by mouth daily.  Dispense: 120 tablet; Refill: 0    Patient was given the opportunity to ask questions.  Patient verbalized understanding of the plan and was able to repeat key  elements of the plan. Patient was given clear instructions to go to Emergency Department or return to medical center if symptoms don't improve, worsen, or new problems develop.The patient verbalized understanding.   Orders Placed This Encounter  Procedures   Basic Metabolic Panel   Lipid Panel     Requested Prescriptions   Signed Prescriptions Disp Refills   amLODipine (NORVASC) 5 MG tablet 90 tablet 0    Sig: Take 1 tablet (5 mg total) by mouth daily. Must have office visit for additional refills   lisinopril (ZESTRIL) 20 MG tablet 90 tablet 0    Sig: Take 1 tablet (20 mg total) by mouth daily. Must have office visit for additional refills   atorvastatin (LIPITOR) 20  MG tablet 120 tablet 0    Sig: Take 1 tablet (20 mg total) by mouth at bedtime. Must have office visit for additional refills   Multiple Vitamins-Minerals (MULTIVITAMIN WOMEN 50+) TABS 120 tablet 0    Sig: Take 1 tablet by mouth daily.   aspirin EC 81 MG tablet 120 tablet 0    Sig: Take 1 tablet (81 mg total) by mouth daily. Swallow whole.    Return in about 3 months (around 03/14/2021) for Follow-Up or next available hypertension.  Camillia Herter, NP

## 2020-12-10 ENCOUNTER — Other Ambulatory Visit: Payer: Self-pay

## 2020-12-10 ENCOUNTER — Ambulatory Visit: Payer: Medicare Other | Admitting: Internal Medicine

## 2020-12-13 ENCOUNTER — Ambulatory Visit (INDEPENDENT_AMBULATORY_CARE_PROVIDER_SITE_OTHER): Payer: Medicare Other | Admitting: Family

## 2020-12-13 ENCOUNTER — Encounter: Payer: Self-pay | Admitting: Family

## 2020-12-13 ENCOUNTER — Other Ambulatory Visit: Payer: Self-pay

## 2020-12-13 VITALS — BP 135/87 | HR 93 | Temp 98.4°F | Resp 18 | Ht <= 58 in | Wt 148.6 lb

## 2020-12-13 DIAGNOSIS — Z791 Long term (current) use of non-steroidal anti-inflammatories (NSAID): Secondary | ICD-10-CM | POA: Diagnosis not present

## 2020-12-13 DIAGNOSIS — E569 Vitamin deficiency, unspecified: Secondary | ICD-10-CM

## 2020-12-13 DIAGNOSIS — Z9229 Personal history of other drug therapy: Secondary | ICD-10-CM | POA: Diagnosis not present

## 2020-12-13 DIAGNOSIS — E785 Hyperlipidemia, unspecified: Secondary | ICD-10-CM

## 2020-12-13 DIAGNOSIS — I10 Essential (primary) hypertension: Secondary | ICD-10-CM | POA: Diagnosis not present

## 2020-12-13 MED ORDER — AMLODIPINE BESYLATE 5 MG PO TABS: 5.0000 mg | ORAL_TABLET | Freq: Every day | ORAL | 0 refills | Status: DC

## 2020-12-13 MED ORDER — MULTIVITAMIN WOMEN 50+ PO TABS: 1.0000 | ORAL_TABLET | Freq: Every day | ORAL | 0 refills | Status: DC

## 2020-12-13 MED ORDER — ASPIRIN EC 81 MG PO TBEC: 81.0000 mg | DELAYED_RELEASE_TABLET | Freq: Every day | ORAL | 0 refills | Status: DC

## 2020-12-13 MED ORDER — ATORVASTATIN CALCIUM 20 MG PO TABS: 20.0000 mg | ORAL_TABLET | Freq: Every day | ORAL | 0 refills | Status: DC

## 2020-12-13 MED ORDER — LISINOPRIL 20 MG PO TABS: 20.0000 mg | ORAL_TABLET | Freq: Every day | ORAL | 0 refills | Status: DC

## 2020-12-13 NOTE — Progress Notes (Signed)
Pt presents for hypertension follow-up, pt needs refill on aspirin, lisinopril, atorvastatin, amlodipine

## 2020-12-14 LAB — LIPID PANEL
Chol/HDL Ratio: 2.2 ratio (ref 0.0–4.4)
Cholesterol, Total: 123 mg/dL (ref 100–199)
HDL: 55 mg/dL (ref >39)
LDL Chol Calc (NIH): 55 mg/dL (ref 0–99)
Triglycerides: 62 mg/dL (ref 0–149)
VLDL Cholesterol Cal: 13 mg/dL (ref 5–40)

## 2020-12-14 LAB — BASIC METABOLIC PANEL
BUN/Creatinine Ratio: 10 — ABNORMAL LOW (ref 12–28)
BUN: 11 mg/dL (ref 8–27)
CO2: 25 mmol/L (ref 20–29)
Calcium: 10.9 mg/dL — ABNORMAL HIGH (ref 8.7–10.3)
Chloride: 104 mmol/L (ref 96–106)
Creatinine, Ser: 1.14 mg/dL — ABNORMAL HIGH (ref 0.57–1.00)
Glucose: 122 mg/dL — ABNORMAL HIGH (ref 65–99)
Potassium: 4 mmol/L (ref 3.5–5.2)
Sodium: 139 mmol/L (ref 134–144)
eGFR: 52 mL/min/{1.73_m2} — ABNORMAL LOW (ref >59)

## 2020-12-14 NOTE — Progress Notes (Signed)
Kidney function not 100%. Please limit use of NSAID's such as Ibuprofen, Motrin, Aleve, and Naproxen if you are consuming these. Also, reminder to remain hydrated with water. Encouraged to recheck kidney function in 4 to 6 weeks at lab only visit. Please call our office to schedule an appointment for this.   Cholesterol labs are normal. Continue Atorvastatin for continued management of cholesterol.

## 2021-02-03 NOTE — Progress Notes (Signed)
Erroneous encounter

## 2021-02-07 ENCOUNTER — Encounter: Payer: Medicare Other | Admitting: Family

## 2021-02-07 DIAGNOSIS — E785 Hyperlipidemia, unspecified: Secondary | ICD-10-CM

## 2021-02-07 DIAGNOSIS — I10 Essential (primary) hypertension: Secondary | ICD-10-CM

## 2021-02-07 NOTE — Progress Notes (Signed)
Patient ID: April Randall, female    DOB: 1950/12/03  MRN: [id number]  CC: Hypertension Follow-Up   Subjective: April Randall is a 70 y.o. female who presents for hypertension follow-up.   Her concerns today include:  HYPERTENSION FOLLOW-UP: 12/13/2020: - Continue Amlodipine and Lisinopril as prescribed.   02/08/2021: Doing well on current regimen. No side effects. No issues/concerns. Denies chest pain and shortness of breath. Reports infrequent headache on occasion. Goes to sleep and headache resolves.   2.HYPERLIPIDEMIA FOLLOW-UP: 12/13/2020: - Continue Atorvastatin.  02/08/2021: Doing well on current regimen. No side effects. No issues/concerns.  Patient Active Problem List   Diagnosis Date Noted   History of right knee joint replacement 01/29/2020   Hyperlipidemia 08/22/2019   Arthritis    Essential hypertension    S/P left unicompartmental knee replacement 12/04/2011   Arthritis of knee 08/30/2011     Current Outpatient Medications on File Prior to Visit  Medication Sig Dispense Refill   aspirin EC 81 MG tablet Take 1 tablet (81 mg total) by mouth daily. Swallow whole. 120 tablet 0   lidocaine (XYLOCAINE) 2 % solution SMARTSIG:By Mouth     No current facility-administered medications on file prior to visit.    No Known Allergies  Social History   Socioeconomic History   Marital status: Single    Spouse name: Not on file   Number of children: Not on file   Years of education: Not on file   Highest education level: Not on file  Occupational History   Not on file  Tobacco Use   Smoking status: Never   Smokeless tobacco: Never  Substance and Sexual Activity   Alcohol use: No    Alcohol/week: 0.0 standard drinks   Drug use: No   Sexual activity: Not on file  Other Topics Concern   Not on file  Social History Narrative   Not on file   Social Determinants of Health   Financial Resource Strain: Not on file  Food Insecurity: Not on file   Transportation Needs: Not on file  Physical Activity: Not on file  Stress: Not on file  Social Connections: Not on file  Intimate Partner Violence: Not on file    Family History  Problem Relation Age of Onset   Hypertension Mother    Breast cancer Neg Hx     Past Surgical History:  Procedure Laterality Date   JOINT REPLACEMENT N/A    [employer] 08/20/2019   REPLACEMENT TOTAL KNEE Right     ROS: Review of Systems Negative except as stated above  PHYSICAL EXAM: BP 137/88 (BP Location: Left Arm, Patient Position: Sitting, Cuff Size: Normal)   Pulse 96   Temp 98.3 F (36.8 C)   Resp 16   Ht 4' 9.99" (1.473 m)   Wt 148 lb 12.8 oz (67.5 kg)   SpO2 96%   BMI 31.11 kg/m   Physical Exam HENT:     Head: Normocephalic and atraumatic.  Eyes:     Extraocular Movements: Extraocular movements intact.     Conjunctiva/sclera: Conjunctivae normal.     Pupils: Pupils are equal, round, and reactive to light.  Cardiovascular:     Rate and Rhythm: Normal rate and regular rhythm.     Pulses: Normal pulses.     Heart sounds: Normal heart sounds.  Pulmonary:     Effort: Pulmonary effort is normal.     Breath sounds: Normal breath sounds.  Musculoskeletal:     Cervical back: Normal range of motion  and neck supple.  Neurological:     General: No focal deficit present.     Mental Status: She is alert and oriented to person, place, and time.  Psychiatric:        Mood and Affect: Mood normal.        Behavior: Behavior normal.   ASSESSMENT AND PLAN: 1. Essential hypertension: - Continue Amlodipine and Lisinopril as prescribed.  - Counseled on blood pressure goal of less than 140/90, low-sodium, DASH diet, medication compliance, 150 minutes of moderate intensity exercise per week as tolerated. Discussed medication compliance, adverse effects. - BMP to evaluate kidney function and electrolyte balance. - Follow-up with primary provider in 3 months or sooner if needed. - Basic Metabolic  Panel - amLODipine (NORVASC) 5 MG tablet; Take 1 tablet (5 mg total) by mouth daily. Must have office visit for additional refills  Dispense: 90 tablet; Refill: 0 - lisinopril (ZESTRIL) 20 MG tablet; Take 1 tablet (20 mg total) by mouth daily. Must have office visit for additional refills  Dispense: 90 tablet; Refill: 0  2. Hyperlipidemia, unspecified hyperlipidemia type: - Practice low-fat heart healthy diet and at least 150 minutes of moderate intensity exercise weekly as tolerated.  - Continue Atorvastatin as prescribed.  - Follow-up with primary provider as scheduled.  - atorvastatin (LIPITOR) 20 MG tablet; Take 1 tablet (20 mg total) by mouth at bedtime. Must have office visit for additional refills  Dispense: 120 tablet; Refill: 0  3. Need for pneumococcal vaccine: - Administered today in office.  - Pneumococcal polysaccharide vaccine 23-valent greater than or equal to 2yo subcutaneous/IM    Patient was given the opportunity to ask questions.  Patient verbalized understanding of the plan and was able to repeat key elements of the plan. Patient was given clear instructions to go to Emergency Department or return to medical center if symptoms don't improve, worsen, or new problems develop.The patient verbalized understanding.   Orders Placed This Encounter  Procedures   Pneumococcal polysaccharide vaccine 23-valent greater than or equal to 2yo subcutaneous/IM   Basic Metabolic Panel     Requested Prescriptions   Signed Prescriptions Disp Refills   amLODipine (NORVASC) 5 MG tablet 90 tablet 0    Sig: Take 1 tablet (5 mg total) by mouth daily. Must have office visit for additional refills   lisinopril (ZESTRIL) 20 MG tablet 90 tablet 0    Sig: Take 1 tablet (20 mg total) by mouth daily. Must have office visit for additional refills   atorvastatin (LIPITOR) 20 MG tablet 120 tablet 0    Sig: Take 1 tablet (20 mg total) by mouth at bedtime. Must have office visit for additional  refills    Return in about 3 months (around 05/11/2021) for Follow-Up or next available hypertension .  Camillia Herter, NP

## 2021-02-08 ENCOUNTER — Other Ambulatory Visit: Payer: Self-pay

## 2021-02-08 ENCOUNTER — Ambulatory Visit (INDEPENDENT_AMBULATORY_CARE_PROVIDER_SITE_OTHER): Payer: Medicare Other | Admitting: Family

## 2021-02-08 VITALS — BP 137/88 | HR 96 | Temp 98.3°F | Resp 16 | Ht <= 58 in | Wt 148.8 lb

## 2021-02-08 DIAGNOSIS — I10 Essential (primary) hypertension: Secondary | ICD-10-CM

## 2021-02-08 DIAGNOSIS — E785 Hyperlipidemia, unspecified: Secondary | ICD-10-CM | POA: Diagnosis not present

## 2021-02-08 DIAGNOSIS — Z23 Encounter for immunization: Secondary | ICD-10-CM | POA: Diagnosis not present

## 2021-02-08 MED ORDER — LISINOPRIL 20 MG PO TABS: 20.0000 mg | ORAL_TABLET | Freq: Every day | ORAL | 0 refills | Status: DC

## 2021-02-08 MED ORDER — AMLODIPINE BESYLATE 5 MG PO TABS: 5.0000 mg | ORAL_TABLET | Freq: Every day | ORAL | 0 refills | Status: DC

## 2021-02-08 MED ORDER — ATORVASTATIN CALCIUM 20 MG PO TABS: 20.0000 mg | ORAL_TABLET | Freq: Every day | ORAL | 0 refills | Status: DC

## 2021-02-08 NOTE — Patient Instructions (Signed)
Managing Your Hypertension Hypertension, also called high blood pressure, is when the force of the blood pressing against the walls of the arteries is too strong. Arteries are blood vessels that carry blood from your heart throughout your body. Hypertension forces the heart to work harder to pump blood and may cause the arteries to become narrow or stiff. Understanding blood pressure readings Your personal target blood pressure may vary depending on your medical conditions, your age, and other factors. A blood pressure reading includes a higher number over a lower number. Ideally, your blood pressure should be below 120/80. You should know that: The first, or top, number is called the systolic pressure. It is a measure of the pressure in your arteries as your heart beats. The second, or bottom number, is called the diastolic pressure. It is a measure of the pressure in your arteries as the heart relaxes. Blood pressure is classified into four stages. Based on your blood pressure reading, your health care provider may use the following stages to determine what type of treatment you need, if any. Systolic pressure and diastolic pressure are measured in a unit called mmHg. Normal Systolic pressure: below 120. Diastolic pressure: below 80. Elevated Systolic pressure: 120-129. Diastolic pressure: below 80. Hypertension stage 1 Systolic pressure: 130-139. Diastolic pressure: 80-89. Hypertension stage 2 Systolic pressure: 140 or above. Diastolic pressure: 90 or above. How can this condition affect me? Managing your hypertension is an important responsibility. Over time, hypertension can damage the arteries and decrease blood flow to important parts of the body, including the brain, heart, and kidneys. Having untreated or uncontrolled hypertension can lead to: A heart attack. A stroke. A weakened blood vessel (aneurysm). Heart failure. Kidney damage. Eye damage. Metabolic syndrome. Memory and  concentration problems. Vascular dementia. What actions can I take to manage this condition? Hypertension can be managed by making lifestyle changes and possibly by taking medicines. Your health care provider will help you make a plan to bring your blood pressure within a normal range. Nutrition  Eat a diet that is high in fiber and potassium, and low in salt (sodium), added sugar, and fat. An example eating plan is called the Dietary Approaches to Stop Hypertension (DASH) diet. To eat this way: Eat plenty of fresh fruits and vegetables. Try to fill one-half of your plate at each meal with fruits and vegetables. Eat whole grains, such as whole-wheat pasta, brown rice, or whole-grain bread. Fill about one-fourth of your plate with whole grains. Eat low-fat dairy products. Avoid fatty cuts of meat, processed or cured meats, and poultry with skin. Fill about one-fourth of your plate with lean proteins such as fish, chicken without skin, beans, eggs, and tofu. Avoid pre-made and processed foods. These tend to be higher in sodium, added sugar, and fat. Reduce your daily sodium intake. Most people with hypertension should eat less than 1,500 mg of sodium a day. Lifestyle  Work with your health care provider to maintain a healthy body weight or to lose weight. Ask what an ideal weight is for you. Get at least 30 minutes of exercise that causes your heart to beat faster (aerobic exercise) most days of the week. Activities may include walking, swimming, or biking. Include exercise to strengthen your muscles (resistance exercise), such as weight lifting, as part of your weekly exercise routine. Try to do these types of exercises for 30 minutes at least 3 days a week. Do not use any products that contain nicotine or tobacco, such as cigarettes, e-cigarettes,   and chewing tobacco. If you need help quitting, ask your health care provider. Control any long-term (chronic) conditions you have, such as high  cholesterol or diabetes. Identify your sources of stress and find ways to manage stress. This may include meditation, deep breathing, or making time for fun activities. Alcohol use Do not drink alcohol if: Your health care provider tells you not to drink. You are pregnant, may be pregnant, or are planning to become pregnant. If you drink alcohol: Limit how much you use to: 0-1 drink a day for women. 0-2 drinks a day for men. Be aware of how much alcohol is in your drink. In the [place] one drink equals one 12 oz bottle of beer (355 mL), one 5 oz glass of wine (148 mL), or one 1 oz glass of hard liquor (44 mL). Medicines Your health care provider may prescribe medicine if lifestyle changes are not enough to get your blood pressure under control and if: Your systolic blood pressure is 130 or higher. Your diastolic blood pressure is 80 or higher. Take medicines only as told by your health care provider. Follow the directions carefully. Blood pressure medicines must be taken as told by your health care provider. The medicine does not work as well when you skip doses. Skipping doses also puts you at risk for problems. Monitoring Before you monitor your blood pressure: Do not smoke, drink caffeinated beverages, or exercise within 30 minutes before taking a measurement. Use the bathroom and empty your bladder (urinate). Sit quietly for at least 5 minutes before taking measurements. Monitor your blood pressure at home as told by your health care provider. To do this: Sit with your back straight and supported. Place your feet flat on the floor. Do not cross your legs. Support your arm on a flat surface, such as a table. Make sure your upper arm is at heart level. Each time you measure, take two or three readings one minute apart and record the results. You may also need to have your blood pressure checked regularly by your health care provider. General information Talk with your health care  provider about your diet, exercise habits, and other lifestyle factors that may be contributing to hypertension. Review all the medicines you take with your health care provider because there may be side effects or interactions. Keep all visits as told by your health care provider. Your health care provider can help you create and adjust your plan for managing your high blood pressure. Where to find more information National Heart, Lung, and Blood Institute: www.nhlbi.nih.gov American Heart Association: www.heart.org Contact a health care provider if: You think you are having a reaction to medicines you have taken. You have repeated (recurrent) headaches. You feel dizzy. You have swelling in your ankles. You have trouble with your vision. Get help right away if: You develop a severe headache or confusion. You have unusual weakness or numbness, or you feel faint. You have severe pain in your chest or abdomen. You vomit repeatedly. You have trouble breathing. These symptoms may represent a serious problem that is an emergency. Do not wait to see if the symptoms will go away. Get medical help right away. Call your local emergency services (911 in the [place] Do not drive yourself to the hospital. Summary Hypertension is when the force of blood pumping through your arteries is too strong. If this condition is not controlled, it may put you at risk for serious complications. Your personal target blood pressure may vary depending on   your medical conditions, your age, and other factors. For most people, a normal blood pressure is less than 120/80. Hypertension is managed by lifestyle changes, medicines, or both. Lifestyle changes to help manage hypertension include losing weight, eating a healthy, low-sodium diet, exercising more, stopping smoking, and limiting alcohol. This information is not intended to replace advice given to you by your health care provider. Make sure you discuss any questions  you have with your health care provider. Document Revised: 03/31/2019 Document Reviewed: 02/11/2019 Elsevier Patient Education  2022 Elsevier Inc.  

## 2021-02-08 NOTE — Progress Notes (Signed)
Pt presents for hypertension follow-up, wants 2nd dose pneumococcal

## 2021-02-09 LAB — BASIC METABOLIC PANEL
BUN/Creatinine Ratio: 16 (ref 12–28)
BUN: 13 mg/dL (ref 8–27)
CO2: 20 mmol/L (ref 20–29)
Calcium: 10.3 mg/dL (ref 8.7–10.3)
Chloride: 105 mmol/L (ref 96–106)
Creatinine, Ser: 0.82 mg/dL (ref 0.57–1.00)
Glucose: 79 mg/dL (ref 70–99)
Potassium: 3.7 mmol/L (ref 3.5–5.2)
Sodium: 141 mmol/L (ref 134–144)
eGFR: 77 mL/min/{1.73_m2} (ref >59)

## 2021-02-09 NOTE — Progress Notes (Signed)
Kidney function normal compared to 4 weeks ago.

## 2021-04-04 ENCOUNTER — Telehealth: Payer: Self-pay | Admitting: Family

## 2021-04-04 ENCOUNTER — Other Ambulatory Visit: Payer: Self-pay

## 2021-04-04 DIAGNOSIS — Z9229 Personal history of other drug therapy: Secondary | ICD-10-CM

## 2021-04-04 MED ORDER — ASPIRIN EC 81 MG PO TBEC: 81.0000 mg | DELAYED_RELEASE_TABLET | Freq: Every day | ORAL | 0 refills | Status: DC

## 2021-04-04 NOTE — Telephone Encounter (Signed)
aspirin EC 81 MG tablet [[id number]]   Pharmacy  [id number] [address], [place], [place] [address]  Phone:  [phone]  Fax:  [phone]

## 2021-04-04 NOTE — Telephone Encounter (Signed)
Medication refill request completed.  

## 2021-05-09 NOTE — Progress Notes (Signed)
Patient ID: April Randall, female    DOB: 1950-07-26  MRN: [id number]  CC: Hypertension Follow-Up  Subjective: April Randall is a 71 y.o. female who presents for hypertension follow-up.   Her concerns today include:  HYPERTENSION FOLLOW-UP: 02/08/2021: - Continue Amlodipine and Lisinopril as prescribed.   05/11/2021: Doing well on current regimen. No side effects. No issues/concerns. Denies chest pain and shortness of breath. Home blood pressures 120's/80's.   2. HYPERLIPIDEMIA FOLLOW-UP: Atorvastatin, doing well no issues/concerns.   3. BLOOD THINNER FOLLOW-UP: Aspirin, doing well no issues/concerns.   4. PREDIABETES: Recent home visit from an external NP resulted a hemoglobin A1c of 5.8%  Patient Active Problem List   Diagnosis Date Noted   Prediabetes 05/11/2021   History of right knee joint replacement 01/29/2020   Hyperlipidemia 08/22/2019   Arthritis    Essential hypertension    S/P left unicompartmental knee replacement 12/04/2011   Arthritis of knee 08/30/2011     No current outpatient medications on file prior to visit.   No current facility-administered medications on file prior to visit.    No Known Allergies  Social History   Socioeconomic History   Marital status: Single    Spouse name: Not on file   Number of children: Not on file   Years of education: Not on file   Highest education level: Not on file  Occupational History   Not on file  Tobacco Use   Smoking status: Never    Passive exposure: Never   Smokeless tobacco: Never  Substance and Sexual Activity   Alcohol use: No    Alcohol/week: 0.0 standard drinks   Drug use: No   Sexual activity: Not on file  Other Topics Concern   Not on file  Social History Narrative   Not on file   Social Determinants of Health   Financial Resource Strain: Not on file  Food Insecurity: Not on file  Transportation Needs: Not on file  Physical Activity: Not on file  Stress: Not on file   Social Connections: Not on file  Intimate Partner Violence: Not on file    Family History  Problem Relation Age of Onset   Hypertension Mother    Breast cancer Neg Hx     Past Surgical History:  Procedure Laterality Date   JOINT REPLACEMENT N/A    [employer] 08/20/2019   REPLACEMENT TOTAL KNEE Right     ROS: Review of Systems Negative except as stated above  PHYSICAL EXAM: BP 123/79 (BP Location: Left Arm, Patient Position: Sitting, Cuff Size: Normal)    Pulse 83    Temp 98.3 F (36.8 C)    Resp 18    Ht 4' 9.99" (1.473 m)    Wt 149 lb (67.6 kg)    SpO2 97%    BMI 31.15 kg/m   Physical Exam HENT:     Head: Normocephalic and atraumatic.  Eyes:     Extraocular Movements: Extraocular movements intact.     Conjunctiva/sclera: Conjunctivae normal.     Pupils: Pupils are equal, round, and reactive to light.  Cardiovascular:     Rate and Rhythm: Normal rate and regular rhythm.     Pulses: Normal pulses.     Heart sounds: Normal heart sounds.  Pulmonary:     Effort: Pulmonary effort is normal.     Breath sounds: Normal breath sounds.  Musculoskeletal:     Cervical back: Normal range of motion and neck supple.  Neurological:     General: No  focal deficit present.     Mental Status: She is alert and oriented to person, place, and time.  Psychiatric:        Mood and Affect: Mood normal.        Behavior: Behavior normal.   ASSESSMENT AND PLAN: 1. Essential (primary) hypertension: - Continue Amlodipine and Lisinopril as prescribed.  - Counseled on blood pressure goal of less than 140/90, low-sodium, DASH diet, medication compliance, 150 minutes of moderate intensity exercise per week as tolerated. Discussed medication compliance, adverse effects. - Follow-up with primary provider in 3 months or sooner if needed.  - amLODipine (NORVASC) 5 MG tablet; Take 1 tablet (5 mg total) by mouth daily. Must have office visit for additional refills  Dispense: 90 tablet; Refill: 0 -  lisinopril (ZESTRIL) 20 MG tablet; Take 1 tablet (20 mg total) by mouth daily. Must have office visit for additional refills  Dispense: 90 tablet; Refill: 0  2. Hyperlipidemia, unspecified hyperlipidemia type: - Continue Atorvastatin as prescribed.  - Follow-up with primary provider as scheduled.  - atorvastatin (LIPITOR) 20 MG tablet; Take 1 tablet (20 mg total) by mouth at bedtime. Must have office visit for additional refills  Dispense: 120 tablet; Refill: 0  3. Hx of long term use of blood thinners: - Continue Aspirin as prescribed.  - Follow-up with primary provider as scheduled.  - aspirin EC 81 MG tablet; Take 1 tablet (81 mg total) by mouth daily. Swallow whole.  Dispense: 120 tablet; Refill: 0  4. Prediabetes: - Hemoglobin A1c 5.8% on recent home visit from an external NP. - Discussed the importance of healthy eating habits, low-carbohydrate diet, low-sugar diet, and regular aerobic exercise (at least 150 minutes a week as tolerated) to achieve or maintain control of prediabetes. - Encouraged to recheck prediabetes in 6 months or sooner if needed.   5. Need for shingles vaccine: - Administered today in office. - Varicella-zoster vaccine IM    Patient was given the opportunity to ask questions.  Patient verbalized understanding of the plan and was able to repeat key elements of the plan. Patient was given clear instructions to go to Emergency Department or return to medical center if symptoms don't improve, worsen, or new problems develop.The patient verbalized understanding.   Orders Placed This Encounter  Procedures   Varicella-zoster vaccine IM     Requested Prescriptions   Signed Prescriptions Disp Refills   aspirin EC 81 MG tablet 120 tablet 0    Sig: Take 1 tablet (81 mg total) by mouth daily. Swallow whole.   amLODipine (NORVASC) 5 MG tablet 90 tablet 0    Sig: Take 1 tablet (5 mg total) by mouth daily. Must have office visit for additional refills   atorvastatin  (LIPITOR) 20 MG tablet 120 tablet 0    Sig: Take 1 tablet (20 mg total) by mouth at bedtime. Must have office visit for additional refills   lisinopril (ZESTRIL) 20 MG tablet 90 tablet 0    Sig: Take 1 tablet (20 mg total) by mouth daily. Must have office visit for additional refills    Return in about 3 months (around 08/08/2021) for Follow-Up or next available hypertension.  Camillia Herter, NP

## 2021-05-11 ENCOUNTER — Encounter: Payer: Self-pay | Admitting: Family

## 2021-05-11 ENCOUNTER — Other Ambulatory Visit: Payer: Self-pay

## 2021-05-11 ENCOUNTER — Telehealth: Payer: Self-pay | Admitting: Family

## 2021-05-11 ENCOUNTER — Ambulatory Visit (INDEPENDENT_AMBULATORY_CARE_PROVIDER_SITE_OTHER): Payer: Medicare Other | Admitting: Family

## 2021-05-11 VITALS — BP 123/79 | HR 83 | Temp 98.3°F | Resp 18 | Ht <= 58 in | Wt 149.0 lb

## 2021-05-11 DIAGNOSIS — E785 Hyperlipidemia, unspecified: Secondary | ICD-10-CM | POA: Diagnosis not present

## 2021-05-11 DIAGNOSIS — R7303 Prediabetes: Secondary | ICD-10-CM

## 2021-05-11 DIAGNOSIS — I10 Essential (primary) hypertension: Secondary | ICD-10-CM

## 2021-05-11 DIAGNOSIS — Z9229 Personal history of other drug therapy: Secondary | ICD-10-CM

## 2021-05-11 DIAGNOSIS — Z23 Encounter for immunization: Secondary | ICD-10-CM

## 2021-05-11 MED ORDER — AMLODIPINE BESYLATE 5 MG PO TABS: 5.0000 mg | ORAL_TABLET | Freq: Every day | ORAL | 0 refills | Status: DC

## 2021-05-11 MED ORDER — LISINOPRIL 20 MG PO TABS: 20.0000 mg | ORAL_TABLET | Freq: Every day | ORAL | 0 refills | Status: DC

## 2021-05-11 MED ORDER — ASPIRIN EC 81 MG PO TBEC: 81.0000 mg | DELAYED_RELEASE_TABLET | Freq: Every day | ORAL | 0 refills | Status: DC

## 2021-05-11 MED ORDER — ATORVASTATIN CALCIUM 20 MG PO TABS: 20.0000 mg | ORAL_TABLET | Freq: Every day | ORAL | 0 refills | Status: DC

## 2021-05-11 NOTE — Telephone Encounter (Signed)
Medications updated

## 2021-05-11 NOTE — Progress Notes (Signed)
Pt presents for hypertension follow-up  Request refill on aspirin   Shingrix administered in left deltoid, pt tolerated injection well

## 2021-05-11 NOTE — Telephone Encounter (Signed)
Pt looked at medication list on [facility] and saw Lidocaine and asked to have that removed stating she does not know what that is and does not take it. Thank you

## 2021-05-11 NOTE — Patient Instructions (Signed)
Lisinopril Tablets What is this medication? LISINOPRIL (lyse [place] pril) treats high blood pressure and heart failure. It may also be used to prevent further damage after a heart attack. It works by relaxing blood vessels, which decreases the amount of work the heart has to do. It belongs to a group of medications called ACE inhibitors. This medicine may be used for other purposes; ask your health care provider or pharmacist if you have questions. COMMON BRAND NAME(S): Prinivil, Zestril What should I tell my care team before I take this medication? They need to know if you have any of these conditions: Diabetes Heart or blood vessel disease History of swelling of the tongue, face, or lips with difficulty breathing, difficulty swallowing, hoarseness, or tightening of the throat (angioedema) Kidney disease Low blood pressure An unusual or allergic reaction to lisinopril, other ACE inhibitors, insect venom, foods, dyes, or preservatives Pregnant or trying to get pregnant Breast-feeding How should I use this medication? Take this medication by mouth. Take it as directed on the prescription label at the same time every day. You can take it with or without food. If it upsets your stomach, take it with food. Keep taking it unless your care team tells you to stop. Talk to your care team about the use of this medication in children. While it may be prescribed for children as young as 6 for selected conditions, precautions do apply. Overdosage: If you think you have taken too much of this medicine contact a poison control center or emergency room at once. NOTE: This medicine is only for you. Do not share this medicine with others. What if I miss a dose? If you miss a dose, take it as soon as you can. If it is almost time for your next dose, take only that dose. Do not take double or extra doses. What may interact with this medication? Do not take this medication with any of the following: Hymenoptera  venom Sacubitril; valsartan This medication may also interact with the following: Aliskiren Angiotensin receptor blockers, like losartan or valsartan Certain medications for diabetes Diuretics Everolimus Gold compounds Lithium NSAIDs, medications for pain and inflammation, like ibuprofen or naproxen Potassium salts or supplements Salt substitutes Sirolimus Temsirolimus This list may not describe all possible interactions. Give your health care provider a list of all the medicines, herbs, non-prescription drugs, or dietary supplements you use. Also tell them if you smoke, drink alcohol, or use illegal drugs. Some items may interact with your medicine. What should I watch for while using this medication? Visit your health care provider for regular check-ups. Check your blood pressure as directed. Ask your health care provider what your blood pressure should be. Also, find out when you should contact him or her. Do not treat yourself for coughs, colds, or pain while you are using this medication without asking your health care provider for advice. Some medications may increase your blood pressure. Inform your health care provider if you wish to become pregnant or think you might be pregnant. There is a potential for serious side effects to an unborn child. Talk to your health care provider for more information. You may get drowsy or dizzy. Do not drive, use machinery, or do anything that needs mental alertness until you know how this medication affects you. Do not stand or sit up quickly, especially if you are an older patient. This reduces the risk of dizzy or fainting spells. Alcohol can make you more drowsy and dizzy. Avoid alcoholic drinks. Avoid  salt substitutes unless you are told otherwise by your health care provider. What side effects may I notice from receiving this medication? Side effects that you should report to your care team as soon as possible: Allergic reactions or  angioedema--skin rash, itching, hives, swelling of the face, eyes, lips, tongue, arms, or legs, trouble swallowing or breathing High potassium level--muscle weakness, fast or irregular heartbeat Kidney injury--decrease in the amount of urine, swelling of the ankles, hands, or feet Liver injury--right upper belly pain, loss of appetite, nausea, light-colored stool, dark yellow or brown urine, yellowing skin or eyes, unusual weakness, fatigue Low blood pressure--dizziness, feeling faint or lightheaded, blurry vision Side effects that usually do not require medical attention (report to your care team if they continue or are bothersome): Cough Dizziness Headache This list may not describe all possible side effects. Call your doctor for medical advice about side effects. You may report side effects to FDA at 1-800-FDA-1088. Where should I keep my medication? Keep out of the reach of children and pets. Store at room temperature between 20 and 25 degrees C (68 and 77 degrees F). Protect from moisture. Keep the container tightly closed. Do not freeze. Avoid exposure to extreme heat. Get rid of any unused medication after the expiration date. To get rid of medications that are no longer needed or have expired: Take the medication to a medication take-back program. Check with your pharmacy or law enforcement to find a location. If you cannot return the medication, check the label or package insert to see if the medication should be thrown out in the garbage or flushed down the toilet. If you are not sure, ask your care team. If it is safe to put in the trash, empty the medication out of the container. Mix the medication with cat litter, dirt, coffee grounds, or other unwanted substance. Seal the mixture in a bag or container. Put it in the trash. NOTE: This sheet is a summary. It may not cover all possible information. If you have questions about this medicine, talk to your doctor, pharmacist, or health care  provider.  2022 Elsevier/Gold Standard (2020-11-30 00:00:00)

## 2021-06-14 ENCOUNTER — Ambulatory Visit: Payer: Medicare Other | Admitting: Family Medicine

## 2021-06-22 ENCOUNTER — Ambulatory Visit: Payer: Self-pay | Admitting: *Deleted

## 2021-06-22 ENCOUNTER — Ambulatory Visit (INDEPENDENT_AMBULATORY_CARE_PROVIDER_SITE_OTHER): Payer: Medicare Other | Admitting: Physician Assistant

## 2021-06-22 ENCOUNTER — Encounter: Payer: Self-pay | Admitting: Physician Assistant

## 2021-06-22 VITALS — BP 139/84 | HR 72 | Temp 98.6°F | Resp 18 | Ht <= 58 in | Wt 147.0 lb

## 2021-06-22 DIAGNOSIS — J302 Other seasonal allergic rhinitis: Secondary | ICD-10-CM | POA: Diagnosis not present

## 2021-06-22 DIAGNOSIS — R058 Other specified cough: Secondary | ICD-10-CM | POA: Diagnosis not present

## 2021-06-22 MED ORDER — CETIRIZINE HCL 10 MG PO TABS: 10.0000 mg | ORAL_TABLET | Freq: Every day | ORAL | 11 refills | Status: DC

## 2021-06-22 MED ORDER — PREDNISONE 20 MG PO TABS: 20.0000 mg | ORAL_TABLET | Freq: Every day | ORAL | 0 refills | Status: DC

## 2021-06-22 NOTE — Progress Notes (Signed)
Patient has taken medication today and patient has eaten. ?Patient reports cough and congestion beginning a week ago. Cough is productive with yellow sputum, patient denies sore throat at this time. Patient states robitussin provided minimal relief. ?

## 2021-06-22 NOTE — Telephone Encounter (Signed)
?  Chief Complaint: cough, congestion- patient requesting appointment ?Symptoms: cough, congestion, sore throat ?Frequency: 1 week ?Pertinent Negatives: Patient denies fever, SOB ?Disposition: '[]'$ ED /'[]'$ Urgent Care (no appt availability in office) / '[]'$ Appointment(In office/virtual)/ '[]'$  [facility]/ '[]'$ Home Care/ '[]'$ Refused Recommended Disposition /'[x]'$ [facility]/ '[]'$  Follow-up with PCP ?Additional Notes: Patient requesting appointment- scheduled with mobile unit provider  ? ?Reason for Disposition ? Cough with cold symptoms (e.g., runny nose, postnasal drip, throat clearing) ? ?Answer Assessment - Initial Assessment Questions ?1. ONSET: "When did the cough begin?"  ?    1 week ?2. SEVERITY: "How bad is the cough today?"  ?    Mild/moderate ?3. SPUTUM: "Describe the color of your sputum" (none, dry cough; clear, white, yellow, green) ?    yellow ?4. HEMOPTYSIS: "Are you coughing up any blood?" If so ask: "How much?" (flecks, streaks, tablespoons, etc.) ?    Slight blood with nasal congestion ?5. DIFFICULTY BREATHING: "Are you having difficulty breathing?" If Yes, ask: "How bad is it?" (e.g., mild, moderate, severe)  ?  - MILD: No SOB at rest, mild SOB with walking, speaks normally in sentences, can lie down, no retractions, pulse < 100.  ?  - MODERATE: SOB at rest, SOB with minimal exertion and prefers to sit, cannot lie down flat, speaks in phrases, mild retractions, audible wheezing, pulse 100-120.  ?  - SEVERE: Very SOB at rest, speaks in single words, struggling to breathe, sitting hunched forward, retractions, pulse > 120  ?    Normal breathing ?6. FEVER: "Do you have a fever?" If Yes, ask: "What is your temperature, how was it measured, and when did it start?" ?    no ?7. CARDIAC HISTORY: "Do you have any history of heart disease?" (e.g., heart attack, congestive heart failure)  ?    *No Answer* ?8. LUNG HISTORY: "Do you have any history of lung disease?"  (e.g., pulmonary embolus, asthma,  emphysema) ?    *No Answer* ?9. PE RISK FACTORS: "Do you have a history of blood clots?" (or: recent major surgery, recent prolonged travel, bedridden) ?    *No Answer* ?10. OTHER SYMPTOMS: "Do you have any other symptoms?" (e.g., runny nose, wheezing, chest pain) ?      Sore throat ?11. PREGNANCY: "Is there any chance you are pregnant?" "When was your last menstrual period?" ?      *No Answer* ?12. TRAVEL: "Have you traveled out of the country in the last month?" (e.g., travel history, exposures) ?      *No Answer* ? ?Protocols used: Cough - Acute Productive-A-AH ? ?

## 2021-06-22 NOTE — Telephone Encounter (Signed)
Summary: cough and sore throat  ? The patient has experienced a cough for roughly a week  ? ?The patient shares that their throat is sore as well  ? ?Please contact further when possible   ?  ? ? ?Called patient to review sx of cough and sore throat. No answer. LVMTCB [phone]. ?

## 2021-06-22 NOTE — Patient Instructions (Signed)
You are going to take prednisone 20 mg once a day for the next 5 days.  You are also going to start taking Zyrtec on a daily basis, I would continue to take this until all the pollen is gone.  I encourage you to continue using the Mucinex as needed, make sure that you are getting plenty of rest and drinking lots of water. ? ?I hope that you feel better soon. ? ?Kennieth Rad, PA-C ?Physician Assistant ?[facility] ?[email or web address] ? ? ?Cough, Adult ?Coughing is a reflex that clears your throat and your airways (respiratory system). Coughing helps to heal and protect your lungs. It is normal to cough occasionally, but a cough that happens with other symptoms or lasts a long time may be a sign of a condition that needs treatment. An acute cough may only last 2-3 weeks, while a chronic cough may last 8 or more weeks. ?Coughing is commonly caused by: ?Infection of the respiratory systemby viruses or bacteria. ?Breathing in substances that irritate your lungs. ?Allergies. ?Asthma. ?Mucus that runs down the back of your throat (postnasal drip). ?Smoking. ?Acid backing up from the stomach into the esophagus (gastroesophageal reflux). ?Certain medicines. ?Chronic lung problems. ?Other medical conditions such as heart failure or a blood clot in the lung (pulmonary embolism). ?Follow these instructions at home: ?Medicines ?Take over-the-counter and prescription medicines only as told by your health care provider. ?Talk with your health care provider before you take a cough suppressant medicine. ?Lifestyle ? ?Avoid cigarette smoke. Do not use any products that contain nicotine or tobacco, such as cigarettes, e-cigarettes, and chewing tobacco. If you need help quitting, ask your health care provider. ?Drink enough fluid to keep your urine pale yellow. ?Avoid caffeine. ?Do not drink alcohol if your health care provider tells you not to drink. ?General  instructions ? ?Pay close attention to changes in your cough. Tell your health care provider about them. ?Always cover your mouth when you cough. ?Avoid things that make you cough, such as perfume, candles, cleaning products, or campfire or tobacco smoke. ?If the air is dry, use a cool mist vaporizer or humidifier in your bedroom or your home to help loosen secretions. ?If your cough is worse at night, try to sleep in a semi-upright position. ?Rest as needed. ?Keep all follow-up visits as told by your health care provider. This is important. ?Contact a health care provider if you: ?Have new symptoms. ?Cough up pus. ?Have a cough that does not get better after 2-3 weeks or gets worse. ?Cannot control your cough with cough suppressant medicines and you are losing sleep. ?Have pain that gets worse or pain that is not helped with medicine. ?Have a fever. ?Have unexplained weight loss. ?Have night sweats. ?Get help right away if: ?You cough up blood. ?You have difficulty breathing. ?Your heartbeat is very fast. ?These symptoms may represent a serious problem that is an emergency. Do not wait to see if the symptoms will go away. Get medical help right away. Call your local emergency services (911 in the [place] Do not drive yourself to the hospital. ?Summary ?Coughing is a reflex that clears your throat and your airways. It is normal to cough occasionally, but a cough that happens with other symptoms or lasts a long time may be a sign of a condition that needs treatment. ?Take over-the-counter and prescription medicines only as told by your health care provider. ?Always cover your mouth when you cough. ?Contact a health care provider if  you have new symptoms or a cough that does not get better after 2-3 weeks or gets worse. ?This information is not intended to replace advice given to you by your health care provider. Make sure you discuss any questions you have with your health care provider. ?Document Revised: 04/01/2018  Document Reviewed: 04/01/2018 ?Elsevier Patient Education ? [place]. ? ?

## 2021-06-22 NOTE — Progress Notes (Signed)
? ?Established Patient Office Visit ? ?Subjective:  ?Patient ID: April Randall, female    DOB: [birth year]  Age: 71 y.o. MRN: [id number] ? ?CC:  ?Chief Complaint  ?Patient presents with  ? URI  ? ? ?HPI ?April Randall presents for coughing for the past week with yellow sputum; throat was tight but better now. No sick contacts  ? ?Denies fever, chills, body aches, sore throat, nausea, vomiting, diarrhea. ? ?States that she is eating and drinking okay, states the cough is not keeping her awake at night.    ? ?Has tried robitussin with a small amount of relief. ? ? ? ?Past Medical History:  ?Diagnosis Date  ? Arthritis   ? Hypertension   ? ? ?Past Surgical History:  ?Procedure Laterality Date  ? JOINT REPLACEMENT N/A   ? [employer] 08/20/2019  ? REPLACEMENT TOTAL KNEE Right   ? ? ?Family History  ?Problem Relation Age of Onset  ? Hypertension Mother   ? Breast cancer Neg Hx   ? ? ?Social History  ? ?Socioeconomic History  ? Marital status: Single  ?  Spouse name: Not on file  ? Number of children: Not on file  ? Years of education: Not on file  ? Highest education level: Not on file  ?Occupational History  ? Not on file  ?Tobacco Use  ? Smoking status: Never  ?  Passive exposure: Never  ? Smokeless tobacco: Never  ?Substance and Sexual Activity  ? Alcohol use: No  ?  Alcohol/week: 0.0 standard drinks  ? Drug use: No  ? Sexual activity: Not Currently  ?Other Topics Concern  ? Not on file  ?Social History Narrative  ? Not on file  ? ?Social Determinants of Health  ? ?Financial Resource Strain: Not on file  ?Food Insecurity: Not on file  ?Transportation Needs: Not on file  ?Physical Activity: Not on file  ?Stress: Not on file  ?Social Connections: Not on file  ?Intimate Partner Violence: Not on file  ? ? ?Outpatient Medications Prior to Visit  ?Medication Sig Dispense Refill  ? amLODipine (NORVASC) 5 MG tablet Take 1 tablet (5 mg total) by mouth daily. Must have office visit for additional refills 90 tablet 0  ? aspirin EC  81 MG tablet Take 1 tablet (81 mg total) by mouth daily. Swallow whole. 120 tablet 0  ? atorvastatin (LIPITOR) 20 MG tablet Take 1 tablet (20 mg total) by mouth at bedtime. Must have office visit for additional refills 120 tablet 0  ? lisinopril (ZESTRIL) 20 MG tablet Take 1 tablet (20 mg total) by mouth daily. Must have office visit for additional refills 90 tablet 0  ? ?No facility-administered medications prior to visit.  ? ? ?No Known Allergies ? ?ROS ?Review of Systems  ?Constitutional:  Negative for chills, fatigue and fever.  ?HENT:  Positive for congestion. Negative for ear pain, postnasal drip, rhinorrhea, sinus pressure, sinus pain, sneezing, sore throat and trouble swallowing.   ?Eyes: Negative.   ?Respiratory:  Positive for cough. Negative for shortness of breath and wheezing.   ?Cardiovascular:  Negative for chest pain.  ?Gastrointestinal:  Negative for diarrhea, nausea and vomiting.  ?Endocrine: Negative.   ?Genitourinary: Negative.   ?Musculoskeletal:  Negative for myalgias.  ?Skin: Negative.   ?Allergic/Immunologic: Negative.   ?Neurological: Negative.   ?Hematological: Negative.   ?Psychiatric/Behavioral: Negative.    ? ?  ?Objective:  ?  ?Physical Exam ?Vitals and nursing note reviewed.  ?Constitutional:   ?   Appearance: Normal  appearance.  ?HENT:  ?   Head: Normocephalic and atraumatic.  ?   Right Ear: Tympanic membrane, ear canal and external ear normal.  ?   Left Ear: Tympanic membrane, ear canal and external ear normal.  ?   Nose: Nose normal.  ?   Right Turbinates: Swollen.  ?   Left Turbinates: Swollen.  ?   Right Sinus: No frontal sinus tenderness.  ?   Left Sinus: No frontal sinus tenderness.  ?   Mouth/Throat:  ?   Mouth: Mucous membranes are moist.  ?   Pharynx: Oropharynx is clear.  ?Eyes:  ?   Extraocular Movements: Extraocular movements intact.  ?   Conjunctiva/sclera: Conjunctivae normal.  ?   Pupils: Pupils are equal, round, and reactive to light.  ?Cardiovascular:  ?   Rate and  Rhythm: Normal rate and regular rhythm.  ?   Pulses: Normal pulses.  ?Pulmonary:  ?   Effort: Pulmonary effort is normal.  ?   Breath sounds: Normal breath sounds. No wheezing.  ?Musculoskeletal:     ?   General: Normal range of motion.  ?   Cervical back: Normal range of motion and neck supple.  ?Lymphadenopathy:  ?   Cervical: No cervical adenopathy.  ?Skin: ?   General: Skin is warm and dry.  ?Neurological:  ?   General: No focal deficit present.  ?   Mental Status: She is alert and oriented to person, place, and time. Mental status is at baseline.  ?Psychiatric:     ?   Mood and Affect: Mood normal.     ?   Behavior: Behavior normal.     ?   Thought Content: Thought content normal.     ?   Judgment: Judgment normal.  ? ? ?BP 139/84 (BP Location: Left Arm, Patient Position: Sitting, Cuff Size: Normal)   Pulse 72   Temp 98.6 ?F (37 ?C) (Oral)   Resp 18   Ht 4' 8"  (1.422 m)   Wt 147 lb (66.7 kg)   SpO2 97%   BMI 32.96 kg/m?  ?Wt Readings from Last 3 Encounters:  ?06/22/21 147 lb (66.7 kg)  ?05/11/21 149 lb (67.6 kg)  ?02/08/21 148 lb 12.8 oz (67.5 kg)  ? ? ? ?Health Maintenance Due  ?Topic Date Due  ? COLONOSCOPY (Pts 45-50yr Insurance coverage will need to be confirmed)  Never done  ? COVID-19 Vaccine (3 - Booster for Pfizer series) 08/20/2019  ? ? ?There are no preventive care reminders to display for this patient. ? ?No results found for: TSH ?Lab Results  ?Component Value Date  ? WBC 4.3 10/29/2019  ? HGB 14.0 10/29/2019  ? HCT 41.2 10/29/2019  ? MCV 91 10/29/2019  ? PLT 198 10/29/2019  ? ?Lab Results  ?Component Value Date  ? NA 141 02/08/2021  ? K 3.7 02/08/2021  ? CO2 20 02/08/2021  ? GLUCOSE 79 02/08/2021  ? BUN 13 02/08/2021  ? CREATININE 0.82 02/08/2021  ? BILITOT 0.5 10/29/2019  ? ALKPHOS 129 (H) 10/29/2019  ? AST 24 10/29/2019  ? ALT 22 10/29/2019  ? PROT 7.3 10/29/2019  ? ALBUMIN 4.7 10/29/2019  ? CALCIUM 10.3 02/08/2021  ? ANIONGAP 8 03/27/2015  ? EGFR 77 02/08/2021  ? ?Lab Results  ?Component  Value Date  ? CHOL 123 12/13/2020  ? ?Lab Results  ?Component Value Date  ? HDL 55 12/13/2020  ? ?Lab Results  ?Component Value Date  ? LMerriman55 12/13/2020  ? ?Lab Results  ?  Component Value Date  ? TRIG 62 12/13/2020  ? ?Lab Results  ?Component Value Date  ? CHOLHDL 2.2 12/13/2020  ? ?Lab Results  ?Component Value Date  ? HGBA1C 5.8 (H) 03/27/2015  ? ? ?  ?Assessment & Plan:  ? ?Problem List Items Addressed This Visit   ?None ?Visit Diagnoses   ? ? Productive cough    -  Primary  ? Relevant Medications  ? predniSONE (DELTASONE) 20 MG tablet  ? Seasonal allergies      ? Relevant Medications  ? cetirizine (ZYRTEC) 10 MG tablet  ? ?  ? ? ?Meds ordered this encounter  ?Medications  ? predniSONE (DELTASONE) 20 MG tablet  ?  Sig: Take 1 tablet (20 mg total) by mouth daily with breakfast.  ?  Dispense:  5 tablet  ?  Refill:  0  ?  Order Specific Question:   Supervising Provider  ?  Answer:   Elsie Stain [[id number]]  ? cetirizine (ZYRTEC) 10 MG tablet  ?  Sig: Take 1 tablet (10 mg total) by mouth daily.  ?  Dispense:  30 tablet  ?  Refill:  11  ?  Order Specific Question:   Supervising Provider  ?  Answer:   Elsie Stain [[id number]]  ?1. Productive cough ?Short course prednisone.  Patient education given on supportive care, red flags given for prompt reevaluation ?- predniSONE (DELTASONE) 20 MG tablet; Take 1 tablet (20 mg total) by mouth daily with breakfast.  Dispense: 5 tablet; Refill: 0 ? ?2. Seasonal allergies ?Trial Zyrtec ?- cetirizine (ZYRTEC) 10 MG tablet; Take 1 tablet (10 mg total) by mouth daily.  Dispense: 30 tablet; Refill: 11 ? ? ? ?I have reviewed the patient's medical history (PMH, PSH, Social History, Family History, Medications, and allergies) , and have been updated if relevant. I spent 20 minutes reviewing chart and  face to face time with patient. ? ? ?Follow-up: Return if symptoms worsen or fail to improve.  ? ? ?Brodie Correll S Mayers, PA-C ?

## 2021-06-29 ENCOUNTER — Other Ambulatory Visit: Payer: Self-pay | Admitting: Family

## 2021-06-29 DIAGNOSIS — Z9229 Personal history of other drug therapy: Secondary | ICD-10-CM

## 2021-06-30 ENCOUNTER — Other Ambulatory Visit: Payer: Self-pay | Admitting: Internal Medicine

## 2021-06-30 DIAGNOSIS — Z1231 Encounter for screening mammogram for malignant neoplasm of breast: Secondary | ICD-10-CM

## 2021-07-14 ENCOUNTER — Other Ambulatory Visit: Payer: Self-pay

## 2021-07-14 DIAGNOSIS — E785 Hyperlipidemia, unspecified: Secondary | ICD-10-CM

## 2021-07-14 DIAGNOSIS — J302 Other seasonal allergic rhinitis: Secondary | ICD-10-CM

## 2021-07-14 DIAGNOSIS — Z9229 Personal history of other drug therapy: Secondary | ICD-10-CM

## 2021-07-14 DIAGNOSIS — I10 Essential (primary) hypertension: Secondary | ICD-10-CM

## 2021-07-14 MED ORDER — ATORVASTATIN CALCIUM 20 MG PO TABS: 20.0000 mg | ORAL_TABLET | Freq: Every day | ORAL | 0 refills | Status: DC

## 2021-07-14 MED ORDER — AMLODIPINE BESYLATE 5 MG PO TABS: 5.0000 mg | ORAL_TABLET | Freq: Every day | ORAL | 0 refills | Status: DC

## 2021-07-14 MED ORDER — LISINOPRIL 20 MG PO TABS: 20.0000 mg | ORAL_TABLET | Freq: Every day | ORAL | 0 refills | Status: DC

## 2021-07-14 MED ORDER — ASPIRIN 81 MG PO TBEC: 81.0000 mg | DELAYED_RELEASE_TABLET | Freq: Every day | ORAL | 2 refills | Status: AC

## 2021-07-14 MED ORDER — CETIRIZINE HCL 10 MG PO TABS: 10.0000 mg | ORAL_TABLET | Freq: Every day | ORAL | 11 refills | Status: DC

## 2021-08-03 NOTE — Progress Notes (Deleted)
Patient ID: April Randall, female    DOB: 1950/12/25  MRN: [id number]  CC: Hypertension Follow-Up  Subjective: April Randall is a 71 y.o. female who presents for hypertension follow-up.   Her concerns today include:  Hypertension follow-up: 05/11/2021: - Continue Amlodipine and Lisinopril as prescribed.   08/09/2021:  2. Hyperlipidemia follow-up: 05/11/2021: - Continue Atorvastatin as prescribed.   08/09/2021:   Patient Active Problem List   Diagnosis Date Noted   Prediabetes 05/11/2021   History of right knee joint replacement 01/29/2020   Hyperlipidemia 08/22/2019   Arthritis    Essential hypertension    S/P left unicompartmental knee replacement 12/04/2011   Arthritis of knee 08/30/2011     Current Outpatient Medications on File Prior to Visit  Medication Sig Dispense Refill   amLODipine (NORVASC) 5 MG tablet Take 1 tablet (5 mg total) by mouth daily. Must have office visit for additional refills 90 tablet 0   aspirin 81 MG EC tablet Take 1 tablet (81 mg total) by mouth 5 (five) times daily. SWALLOW WHOLE. 150 tablet 2   atorvastatin (LIPITOR) 20 MG tablet Take 1 tablet (20 mg total) by mouth at bedtime. Must have office visit for additional refills 120 tablet 0   cetirizine (ZYRTEC) 10 MG tablet Take 1 tablet (10 mg total) by mouth daily. 30 tablet 11   lisinopril (ZESTRIL) 20 MG tablet Take 1 tablet (20 mg total) by mouth daily. Must have office visit for additional refills 90 tablet 0   predniSONE (DELTASONE) 20 MG tablet Take 1 tablet (20 mg total) by mouth daily with breakfast. 5 tablet 0   No current facility-administered medications on file prior to visit.    No Known Allergies  Social History   Socioeconomic History   Marital status: Single    Spouse name: Not on file   Number of children: Not on file   Years of education: Not on file   Highest education level: Not on file  Occupational History   Not on file  Tobacco Use   Smoking status: Never     Passive exposure: Never   Smokeless tobacco: Never  Substance and Sexual Activity   Alcohol use: No    Alcohol/week: 0.0 standard drinks   Drug use: No   Sexual activity: Not Currently  Other Topics Concern   Not on file  Social History Narrative   Not on file   Social Determinants of Health   Financial Resource Strain: Not on file  Food Insecurity: Not on file  Transportation Needs: Not on file  Physical Activity: Not on file  Stress: Not on file  Social Connections: Not on file  Intimate Partner Violence: Not on file    Family History  Problem Relation Age of Onset   Hypertension Mother    Breast cancer Neg Hx     Past Surgical History:  Procedure Laterality Date   JOINT REPLACEMENT N/A    [employer]reesia 08/20/2019   REPLACEMENT TOTAL KNEE Right     ROS: Review of Systems Negative except as stated above  PHYSICAL EXAM: There were no vitals taken for this visit.  Physical Exam  {female adult master:310786} {female adult master:310785}     Latest Ref Rng & Units 02/08/2021   11:19 AM 12/13/2020   11:28 AM 10/29/2019   10:27 AM  CMP  Glucose 70 - 99 mg/dL 79   122   94    BUN 8 - 27 mg/dL 13   11   13  Creatinine 0.57 - 1.00 mg/dL 0.82   1.14   1.01    Sodium 134 - 144 mmol/L 141   139   141    Potassium 3.5 - 5.2 mmol/L 3.7   4.0   3.9    Chloride 96 - 106 mmol/L 105   104   105    CO2 20 - 29 mmol/L '20   25   22    '$ Calcium 8.7 - 10.3 mg/dL 10.3   10.9   11.0    Total Protein 6.0 - 8.5 g/dL   7.3    Total Bilirubin 0.0 - 1.2 mg/dL   0.5    Alkaline Phos 48 - 121 IU/L   129    AST 0 - 40 IU/L   24    ALT 0 - 32 IU/L   22     Lipid Panel     Component Value Date/Time   CHOL 123 12/13/2020 1128   TRIG 62 12/13/2020 1128   HDL 55 12/13/2020 1128   CHOLHDL 2.2 12/13/2020 1128   CHOLHDL 3.0 03/27/2015 0410   VLDL 7 03/27/2015 0410   LDLCALC 55 12/13/2020 1128    CBC    Component Value Date/Time   WBC 4.3 10/29/2019 1027   WBC 4.8 03/26/2015  2142   RBC 4.55 10/29/2019 1027   RBC 4.69 03/26/2015 2142   HGB 14.0 10/29/2019 1027   HCT 41.2 10/29/2019 1027   PLT 198 10/29/2019 1027   MCV 91 10/29/2019 1027   MCH 30.8 10/29/2019 1027   MCH 30.1 03/26/2015 2142   MCHC 34.0 10/29/2019 1027   MCHC 33.5 03/26/2015 2142   RDW 12.1 10/29/2019 1027   LYMPHSABS 1.3 10/29/2019 1027   MONOABS 0.3 03/26/2015 2142   EOSABS 0.1 10/29/2019 1027   BASOSABS 0.0 10/29/2019 1027    ASSESSMENT AND PLAN:  There are no diagnoses linked to this encounter.   Patient was given the opportunity to ask questions.  Patient verbalized understanding of the plan and was able to repeat key elements of the plan. Patient was given clear instructions to go to Emergency Department or return to medical center if symptoms don't improve, worsen, or new problems develop.The patient verbalized understanding.   No orders of the defined types were placed in this encounter.    Requested Prescriptions    No prescriptions requested or ordered in this encounter    No follow-ups on file.  Camillia Herter, NP

## 2021-08-09 ENCOUNTER — Ambulatory Visit: Payer: Medicare Other | Admitting: Family

## 2021-08-09 DIAGNOSIS — E785 Hyperlipidemia, unspecified: Secondary | ICD-10-CM

## 2021-08-09 DIAGNOSIS — I10 Essential (primary) hypertension: Secondary | ICD-10-CM

## 2021-08-23 ENCOUNTER — Ambulatory Visit
Admission: RE | Admit: 2021-08-23 | Discharge: 2021-08-23 | Disposition: A | Discharge status: Home / Self Care | Payer: Medicare Other | Source: Ambulatory Visit | Attending: Internal Medicine | Admitting: Internal Medicine

## 2021-08-23 DIAGNOSIS — Z1231 Encounter for screening mammogram for malignant neoplasm of breast: Secondary | ICD-10-CM

## 2021-08-26 NOTE — Progress Notes (Signed)
Patient ID: April Randall, female    DOB: [birth year]  MRN: [id number]  CC: Chronic Conditions Follow-Up  Subjective: April Randall is a 71 y.o. female who presents for chronic conditions follow-up.   Her concerns today include:  Hypertension follow-up: 05/11/2021: - Continue Amlodipine and Lisinopril as prescribed.   08/30/2021: Doing well on current regimen. No side effects. No issues/concerns. Denies chest pain, shortness of breath, worst headache of life and additional red flag symptoms.   2. Hyperlipidemia follow-up: 05/11/2021: - Continue Atorvastatin as prescribed.   08/30/2021: Doing well on current regimen, no issues/concerns.  3. 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 Reports she felt dizzy, held onto a bar in the kitchen, and closed her eyes. Coworkers brought a chair over to her and she was able to sit down. Had some Gatorade. Eventually felt better and returned back to work. No additional symptoms or occurrences since then.   Patient Active Problem List   Diagnosis Date Noted   Prediabetes 05/11/2021   History of right knee joint replacement 01/29/2020   Hyperlipidemia 08/22/2019   Arthritis    Essential hypertension    S/P left unicompartmental knee replacement 12/04/2011   Arthritis of knee 08/30/2011     Current Outpatient Medications on File Prior to Visit  Medication Sig Dispense Refill   aspirin 81 MG EC tablet Take 1 tablet (81 mg total) by mouth 5 (five) times daily. SWALLOW WHOLE. 150 tablet 2   cetirizine (ZYRTEC) 10 MG tablet Take 1 tablet (10 mg total) by mouth daily. 30 tablet 11   predniSONE (DELTASONE) 20 MG tablet Take 1 tablet (20 mg total) by mouth daily with breakfast. 5 tablet 0   No current facility-administered medications on file prior to visit.    No Known Allergies  Social History   Socioeconomic History   Marital status: Single    Spouse name: Not on file   Number of children: Not on file    Years of education: Not on file   Highest education level: Not on file  Occupational History   Not on file  Tobacco Use   Smoking status: Never    Passive exposure: Never   Smokeless tobacco: Never  Substance and Sexual Activity   Alcohol use: No    Alcohol/week: 0.0 standard drinks   Drug use: No   Sexual activity: Not Currently  Other Topics Concern   Not on file  Social History Narrative   Not on file   Social Determinants of Health   Financial Resource Strain: Not on file  Food Insecurity: Not on file  Transportation Needs: Not on file  Physical Activity: Not on file  Stress: Not on file  Social Connections: Not on file  Intimate Partner Violence: Not on file    Family History  Problem Relation Age of Onset   Hypertension Mother    Breast cancer Neg Hx     Past Surgical History:  Procedure Laterality Date   JOINT REPLACEMENT N/A    [employer]reesia 08/20/2019   REPLACEMENT TOTAL KNEE Right     ROS: Review of Systems Negative except as stated above  PHYSICAL EXAM: BP 132/84 (BP Location: Left Arm, Patient Position: Sitting, Cuff Size: Normal)   Pulse 85   Temp 98.3 F (36.8 C)   Resp 18   Ht 4' 7.98" (1.422 m)   Wt 142 lb (64.4 kg)   SpO2 98%   BMI 31.85 kg/m   Physical Exam HENT:  Head: Normocephalic and atraumatic.  Eyes:     Extraocular Movements: Extraocular movements intact.     Conjunctiva/sclera: Conjunctivae normal.     Pupils: Pupils are equal, round, and reactive to light.  Cardiovascular:     Rate and Rhythm: Normal rate and regular rhythm.     Pulses: Normal pulses.     Heart sounds: Normal heart sounds.  Pulmonary:     Effort: Pulmonary effort is normal.     Breath sounds: Normal breath sounds.  Musculoskeletal:     Cervical back: Normal range of motion and neck supple.  Neurological:     General: No focal deficit present.     Mental Status: She is alert and oriented to person, place, and time.  Psychiatric:        Mood and  Affect: Mood normal.        Behavior: Behavior normal.    ASSESSMENT AND PLAN: 1. Essential (primary) hypertension - Continue Amlodipine and Lisinopril as prescribed.  - Counseled on blood pressure goal of less than 140/90, low-sodium, DASH diet, medication compliance, 150 minutes of moderate intensity exercise per week as tolerated. Discussed medication compliance, adverse effects. - BMP to evaluate kidney function and electrolyte balance. - Follow-up with primary provider in 4 months or sooner if needed. - Basic Metabolic Panel - amLODipine (NORVASC) 5 MG tablet; Take 1 tablet (5 mg total) by mouth daily.  Dispense: 120 tablet; Refill: 0 - lisinopril (ZESTRIL) 20 MG tablet; Take 1 tablet (20 mg total) by mouth daily.  Dispense: 120 tablet; Refill: 0  2. Hyperlipidemia, unspecified hyperlipidemia type - Continue Atorvastatin as prescribed.  - Follow-up with primary provider as scheduled. - atorvastatin (LIPITOR) 20 MG tablet; Take 1 tablet (20 mg total) by mouth at bedtime.  Dispense: 90 tablet; Refill: 1  3. Vertigo - Low dose Meclizine as prescribed. Counseled on medication adherence and adverse effects. May cause drowsiness. Counseled patient to not consume if operating heavy machinery or driving. Counseled patient to not consume with alcohol or illicit substances. Patient verbalized understanding.  - CBC to screen for anemia. - TSH to check thyroid function.  - Follow-up with primary provider in 4 weeks or sooner if needed.  - meclizine (ANTIVERT) 12.5 MG tablet; Take 1 tablet (12.5 mg total) by mouth daily as needed for dizziness.  Dispense: 30 tablet; Refill: 0 - CBC - TSH    Patient was given the opportunity to ask questions.  Patient verbalized understanding of the plan and was able to repeat key elements of the plan. Patient was given clear instructions to go to Emergency Department or return to medical center if symptoms don't improve, worsen, or new problems develop.The  patient verbalized understanding.   Orders Placed This Encounter  Procedures   Basic Metabolic Panel   CBC   TSH     Requested Prescriptions   Signed Prescriptions Disp Refills   amLODipine (NORVASC) 5 MG tablet 120 tablet 0    Sig: Take 1 tablet (5 mg total) by mouth daily.   lisinopril (ZESTRIL) 20 MG tablet 120 tablet 0    Sig: Take 1 tablet (20 mg total) by mouth daily.   atorvastatin (LIPITOR) 20 MG tablet 90 tablet 1    Sig: Take 1 tablet (20 mg total) by mouth at bedtime.   meclizine (ANTIVERT) 12.5 MG tablet 30 tablet 0    Sig: Take 1 tablet (12.5 mg total) by mouth daily as needed for dizziness.    Return in about 4 months (around  12/30/2021) for Follow-Up or next available HTN .  Camillia Herter, NP

## 2021-08-30 ENCOUNTER — Encounter: Payer: Self-pay | Admitting: Family

## 2021-08-30 ENCOUNTER — Ambulatory Visit (INDEPENDENT_AMBULATORY_CARE_PROVIDER_SITE_OTHER): Payer: Medicare Other | Admitting: Family

## 2021-08-30 VITALS — BP 132/84 | HR 85 | Temp 98.3°F | Resp 18 | Ht <= 58 in | Wt 142.0 lb

## 2021-08-30 DIAGNOSIS — R42 Dizziness and giddiness: Secondary | ICD-10-CM

## 2021-08-30 DIAGNOSIS — I10 Essential (primary) hypertension: Secondary | ICD-10-CM | POA: Diagnosis not present

## 2021-08-30 DIAGNOSIS — E785 Hyperlipidemia, unspecified: Secondary | ICD-10-CM | POA: Diagnosis not present

## 2021-08-30 MED ORDER — LISINOPRIL 20 MG PO TABS
20.0000 mg | ORAL_TABLET | Freq: Every day | ORAL | 0 refills | Status: DC
Start: 2021-08-30 — End: 2021-10-20

## 2021-08-30 MED ORDER — MECLIZINE HCL 12.5 MG PO TABS: 12.5000 mg | ORAL_TABLET | Freq: Every day | ORAL | 0 refills | Status: DC | PRN

## 2021-08-30 MED ORDER — ATORVASTATIN CALCIUM 20 MG PO TABS
20.0000 mg | ORAL_TABLET | Freq: Every day | ORAL | 1 refills | Status: DC
Start: 2021-08-30 — End: 2022-01-03

## 2021-08-30 MED ORDER — AMLODIPINE BESYLATE 5 MG PO TABS
5.0000 mg | ORAL_TABLET | Freq: Every day | ORAL | 0 refills | Status: DC
Start: 2021-08-30 — End: 2021-10-20

## 2021-08-30 NOTE — Progress Notes (Signed)
Pt presents for chronic care f/u

## 2021-08-31 ENCOUNTER — Other Ambulatory Visit: Payer: Self-pay | Admitting: Family

## 2021-08-31 DIAGNOSIS — E559 Vitamin D deficiency, unspecified: Secondary | ICD-10-CM

## 2021-08-31 DIAGNOSIS — Z13228 Encounter for screening for other metabolic disorders: Secondary | ICD-10-CM

## 2021-08-31 LAB — TSH: TSH: 2.47 u[IU]/mL (ref 0.450–4.500)

## 2021-08-31 LAB — BASIC METABOLIC PANEL
BUN/Creatinine Ratio: 8 — ABNORMAL LOW (ref 12–28)
BUN: 9 mg/dL (ref 8–27)
CO2: 21 mmol/L (ref 20–29)
Calcium: 10.8 mg/dL — ABNORMAL HIGH (ref 8.7–10.3)
Chloride: 104 mmol/L (ref 96–106)
Creatinine, Ser: 1.07 mg/dL — ABNORMAL HIGH (ref 0.57–1.00)
Glucose: 127 mg/dL — ABNORMAL HIGH (ref 70–99)
Potassium: 3.8 mmol/L (ref 3.5–5.2)
Sodium: 139 mmol/L (ref 134–144)
eGFR: 56 mL/min/{1.73_m2} — ABNORMAL LOW (ref >59)

## 2021-09-12 ENCOUNTER — Other Ambulatory Visit: Payer: Self-pay | Admitting: Family

## 2021-09-12 DIAGNOSIS — E785 Hyperlipidemia, unspecified: Secondary | ICD-10-CM

## 2021-10-01 ENCOUNTER — Other Ambulatory Visit: Payer: Self-pay | Admitting: Family

## 2021-10-01 DIAGNOSIS — I10 Essential (primary) hypertension: Secondary | ICD-10-CM

## 2021-10-03 NOTE — Telephone Encounter (Signed)
Requested medication (s) are due for refill today: No  Requested medication (s) are on the active medication list: Yes  Last refill:  08/30/21  Future visit scheduled: Yes  Notes to clinic:  Different pharmacy requests 1 year supply.    Requested Prescriptions  Pending Prescriptions Disp Refills   lisinopril (ZESTRIL) 20 MG tablet [Pharmacy Med Name: Lisinopril 20 MG Oral Tablet] 90 tablet 3    Sig: TAKE 1 TABLET BY MOUTH DAILY     Cardiovascular:  ACE Inhibitors Failed - 10/01/2021 10:52 PM      Failed - Cr in normal range and within 180 days    Creatinine, Ser  Date Value Ref Range Status  08/30/2021 1.07 (H) 0.57 - 1.00 mg/dL Final         Passed - K in normal range and within 180 days    Potassium  Date Value Ref Range Status  08/30/2021 3.8 3.5 - 5.2 mmol/L Final         Passed - Patient is not pregnant      Passed - Last BP in normal range    BP Readings from Last 1 Encounters:  08/30/21 132/84         Passed - Valid encounter within last 6 months    Recent Outpatient Visits           1 month ago Essential (primary) hypertension   Primary Care at [facility], Amy J, NP   3 months ago Productive cough   Primary Care at [facility], Cari S, PA-C   4 months ago Essential (primary) hypertension   Primary Care at [facility], Amy J, NP   7 months ago Essential hypertension   Primary Care at [facility], Amy J, NP   9 months ago Essential hypertension   Primary Care at [facility], Flonnie Hailstone, NP       Future Appointments             In 3 months Camillia Herter, NP Primary Care at [facility]

## 2021-10-13 ENCOUNTER — Other Ambulatory Visit: Payer: Self-pay | Admitting: Family

## 2021-10-13 DIAGNOSIS — E785 Hyperlipidemia, unspecified: Secondary | ICD-10-CM

## 2021-10-14 NOTE — Telephone Encounter (Signed)
Call to patient- patient states she does not need Rx at this time Requested Prescriptions  Pending Prescriptions Disp Refills  . atorvastatin (LIPITOR) 20 MG tablet [Pharmacy Med Name: Atorvastatin Calcium 20 MG Oral Tablet] 20 tablet 17    Sig: TAKE 1 TABLET BY MOUTH AT  BEDTIME     Cardiovascular:  Antilipid - Statins Failed - 10/13/2021 10:17 PM      Failed - Lipid Panel in normal range within the last 12 months    Cholesterol, Total  Date Value Ref Range Status  12/13/2020 123 100 - 199 mg/dL Final   LDL Chol Calc (NIH)  Date Value Ref Range Status  12/13/2020 55 0 - 99 mg/dL Final   HDL  Date Value Ref Range Status  12/13/2020 55 >39 mg/dL Final   Triglycerides  Date Value Ref Range Status  12/13/2020 62 0 - 149 mg/dL Final         Passed - Patient is not pregnant      Passed - Valid encounter within last 12 months    Recent Outpatient Visits          1 month ago Essential (primary) hypertension   Primary Care at [facility], Amy J, NP   3 months ago Productive cough   Primary Care at [facility], Cari S, PA-C   5 months ago Essential (primary) hypertension   Primary Care at [facility], Amy J, NP   8 months ago Essential hypertension   Primary Care at [facility], Amy J, NP   10 months ago Essential hypertension   Primary Care at [facility], Flonnie Hailstone, NP      Future Appointments            In 2 months Camillia Herter, NP Primary Care at [facility]

## 2021-10-18 ENCOUNTER — Other Ambulatory Visit: Payer: Medicare Other

## 2021-10-19 ENCOUNTER — Other Ambulatory Visit: Payer: Self-pay | Admitting: Family

## 2021-10-19 DIAGNOSIS — I10 Essential (primary) hypertension: Secondary | ICD-10-CM

## 2021-10-20 NOTE — Telephone Encounter (Signed)
Requested Prescriptions  Pending Prescriptions Disp Refills  . amLODipine (NORVASC) 5 MG tablet [Pharmacy Med Name: amLODIPine Besylate 5 MG Oral Tablet] 90 tablet 1    Sig: TAKE 1 TABLET BY MOUTH DAILY  MUST HAVE OFFICE VISIT FOR  ADDITIONAL REFILLS     Cardiovascular: Calcium Channel Blockers 2 Passed - 10/19/2021 10:18 PM      Passed - Last BP in normal range    BP Readings from Last 1 Encounters:  08/30/21 132/84         Passed - Last Heart Rate in normal range    Pulse Readings from Last 1 Encounters:  08/30/21 85         Passed - Valid encounter within last 6 months    Recent Outpatient Visits          1 month ago Essential (primary) hypertension   Primary Care at [facility], Amy J, NP   4 months ago Productive cough   Primary Care at [facility], Cari S, PA-C   5 months ago Essential (primary) hypertension   Primary Care at [facility], Amy J, NP   8 months ago Essential hypertension   Primary Care at [facility], Amy J, NP   10 months ago Essential hypertension   Primary Care at [facility], Flonnie Hailstone, NP      Future Appointments            In 2 months Camillia Herter, NP Primary Care at [facility]

## 2021-11-09 ENCOUNTER — Other Ambulatory Visit: Payer: Self-pay

## 2021-11-09 DIAGNOSIS — E559 Vitamin D deficiency, unspecified: Secondary | ICD-10-CM

## 2021-11-09 DIAGNOSIS — Z13228 Encounter for screening for other metabolic disorders: Secondary | ICD-10-CM

## 2021-11-14 ENCOUNTER — Other Ambulatory Visit: Payer: Self-pay | Admitting: Family

## 2021-11-14 DIAGNOSIS — E559 Vitamin D deficiency, unspecified: Secondary | ICD-10-CM

## 2021-11-14 DIAGNOSIS — E213 Hyperparathyroidism, unspecified: Secondary | ICD-10-CM

## 2021-11-14 LAB — BASIC METABOLIC PANEL
BUN/Creatinine Ratio: 11 — ABNORMAL LOW (ref 12–28)
BUN: 11 mg/dL (ref 8–27)
CO2: 20 mmol/L (ref 20–29)
Calcium: 10.6 mg/dL — ABNORMAL HIGH (ref 8.7–10.3)
Chloride: 102 mmol/L (ref 96–106)
Creatinine, Ser: 1 mg/dL (ref 0.57–1.00)
Glucose: 126 mg/dL — ABNORMAL HIGH (ref 70–99)
Potassium: 3.8 mmol/L (ref 3.5–5.2)
Sodium: 136 mmol/L (ref 134–144)
eGFR: 61 mL/min/{1.73_m2} (ref >59)

## 2021-11-14 LAB — VITAMIN D 25 HYDROXY (VIT D DEFICIENCY, FRACTURES): Vit D, 25-Hydroxy: 11.5 ng/mL — ABNORMAL LOW (ref 30.0–100.0)

## 2021-11-14 LAB — PTH, INTACT AND CALCIUM: PTH: 202 pg/mL — ABNORMAL HIGH (ref 15–65)

## 2021-11-14 LAB — SPECIMEN STATUS REPORT

## 2021-11-22 ENCOUNTER — Telehealth: Payer: Self-pay | Admitting: Family

## 2021-11-22 NOTE — Telephone Encounter (Signed)
Copied from [place] [phone]. Topic: General - Other >> Nov 22, 2021 12:14 PM [id number] J wrote: Reason for CRM: pt says that she received a call from endo to schedule an appt. Pt says that she is unclear as to why she has this referral and would like to discuss further. Pt would like further assistance.

## 2021-11-22 NOTE — Telephone Encounter (Signed)
Based off pt last labs she has hyperparathyroidism and needs further evaluation by Endo

## 2021-11-23 NOTE — Telephone Encounter (Signed)
Pt returned call, message read to pt. Answered all questions to her satisfaction, verbalizes understanding. States she did secure appt with Endo for 12/05/21.

## 2021-12-26 NOTE — Progress Notes (Signed)
Patient ID: April Randall, female    DOB: 06-23-1950  MRN: [id number]  CC: Chronic Care Management   Subjective: April Randall is a 71 y.o. female who presents for chronic care management.   Her concerns today include:  - Doing well on blood pressure and cholesterol medications, no issues or concerns.  - Vertigo persisting. - States she has an appointment scheduled on 01/18/2022 at [facility] for evaluation of hyperparathyroidism. - Bilateral ankle swelling. Denies red flag symptoms. Improves overnight. 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    Patient Active Problem List   Diagnosis Date Noted   Prediabetes 05/11/2021   History of right knee joint replacement 01/29/2020   Hyperlipidemia 08/22/2019   Arthritis    Essential hypertension    S/P left unicompartmental knee replacement 12/04/2011   Arthritis of knee 08/30/2011     Current Outpatient Medications on File Prior to Visit  Medication Sig Dispense Refill   ASPIRIN LOW DOSE 81 MG tablet Take 81 mg by mouth daily.     cetirizine (ZYRTEC) 10 MG tablet Take 1 tablet (10 mg total) by mouth daily. 30 tablet 11   meclizine (ANTIVERT) 12.5 MG tablet Take 1 tablet (12.5 mg total) by mouth daily as needed for dizziness. 30 tablet 0   predniSONE (DELTASONE) 20 MG tablet Take 1 tablet (20 mg total) by mouth daily with breakfast. 5 tablet 0   No current facility-administered medications on file prior to visit.    No Known Allergies  Social History   Socioeconomic History   Marital status: Single    Spouse name: Not on file   Number of children: Not on file   Years of education: Not on file   Highest education level: Not on file  Occupational History   Not on file  Tobacco Use   Smoking status: Never    Passive exposure: Never   Smokeless tobacco: Never  Substance and Sexual Activity   Alcohol use: No    Alcohol/week: 0.0 standard drinks  of alcohol   Drug use: No   Sexual activity: Not Currently  Other Topics Concern   Not on file  Social History Narrative   Not on file   Social Determinants of Health   Financial Resource Strain: Not on file  Food Insecurity: Not on file  Transportation Needs: Not on file  Physical Activity: Not on file  Stress: Not on file  Social Connections: Not on file  Intimate Partner Violence: Not on file    Family History  Problem Relation Age of Onset   Hypertension Mother    Breast cancer Neg Hx     Past Surgical History:  Procedure Laterality Date   JOINT REPLACEMENT N/A    [employer]reesia 08/20/2019   REPLACEMENT TOTAL KNEE Right     ROS: Review of Systems Negative except as stated above  PHYSICAL EXAM: BP 122/86 (BP Location: Left Arm, Patient Position: Sitting, Cuff Size: Normal)   Pulse 85   Temp 98.3 F (36.8 C)   Resp 16   Ht 4' 7.98" (1.422 m)   Wt 143 lb (64.9 kg)   SpO2 96%   BMI 32.08 kg/m   Physical Exam HENT:     Head: Normocephalic and atraumatic.  Eyes:     Extraocular Movements: Extraocular movements intact.     Conjunctiva/sclera: Conjunctivae normal.     Pupils: Pupils are equal, round, and reactive to light.  Cardiovascular:  Rate and Rhythm: Normal rate and regular rhythm.     Pulses: Normal pulses.     Heart sounds: Normal heart sounds.  Pulmonary:     Effort: Pulmonary effort is normal.     Breath sounds: Normal breath sounds.  Musculoskeletal:     Cervical back: Normal range of motion and neck supple.     Right lower leg: Normal.     Left lower leg: Normal.     Right ankle: Swelling present.     Left ankle: Swelling present.     Comments: Mild bilateral ankle edema. No red flag symptoms of bilateral lower extremities.   Neurological:     General: No focal deficit present.     Mental Status: She is alert and oriented to person, place, and time.  Psychiatric:        Mood and Affect: Mood normal.        Behavior: Behavior normal.     Results for orders placed or performed in visit on 01/03/22  POCT glycosylated hemoglobin (Hb A1C)  Result Value Ref Range   Hemoglobin A1C 5.9 (A) 4.0 - 5.6 %   HbA1c POC (<> result, manual entry)     HbA1c, POC (prediabetic range)     HbA1c, POC (controlled diabetic range)      ASSESSMENT AND PLAN: 1. Primary hypertension - Continue Amlodipine and Lisinopril as prescribed.  - Counseled on blood pressure goal of less than 140/90, low-sodium, DASH diet, medication compliance, 150 minutes of moderate intensity exercise per week as tolerated. Discussed medication compliance, adverse effects. - Follow-up with primary provider in 3 months or sooner if needed.  - amLODipine (NORVASC) 5 MG tablet; Take 1 tablet (5 mg total) by mouth daily.  Dispense: 30 tablet; Refill: 2 - lisinopril (ZESTRIL) 20 MG tablet; Take 1 tablet (20 mg total) by mouth daily.  Dispense: 30 tablet; Refill: 2  2. Hyperlipidemia, unspecified hyperlipidemia type - Continue Atorvastatin as prescribed.  - Update lipid panel. - Follow-up with primary provider as scheduled.  - Lipid panel - atorvastatin (LIPITOR) 20 MG tablet; Take 1 tablet (20 mg total) by mouth at bedtime.  Dispense: 30 tablet; Refill: 2  3. Prediabetes - Hemoglobin A1c 5.9% and remaining consistent with prediabetes. Recheck in 6 months.  - POCT glycosylated hemoglobin (Hb A1C)  4. Vertigo - Referral to ENT for further evaluation and management.  - Ambulatory referral to ENT  5. Hyperparathyroidism [facility]) - Keep appointment scheduled on 01/18/2022 at [facility]. Discussed with patient vertigo (see #4) may be secondary to thyroid issues and to discuss at appointment with provider in more detail.  6. Dependent edema - No red flag symptoms. - Discussed conservative measures such as but not limited to keep bilateral legs elevated when sitting or lying down, do not sit or stand for a long time, exercise as tolerated,  and wear support stockings as tolerated. - Follow-up with primary care as scheduled.   7. Need for immunization against influenza - Administered.  - Flu Vaccine QUAD High Dose(Fluad)    Patient was given the opportunity to ask questions.  Patient verbalized understanding of the plan and was able to repeat key elements of the plan. Patient was given clear instructions to go to Emergency Department or return to medical center if symptoms don't improve, worsen, or new problems develop.The patient verbalized understanding.   Orders Placed This Encounter  Procedures   Flu Vaccine QUAD High Dose(Fluad)   Lipid panel   Ambulatory referral  to ENT   POCT glycosylated hemoglobin (Hb A1C)     Requested Prescriptions   Signed Prescriptions Disp Refills   amLODipine (NORVASC) 5 MG tablet 30 tablet 2    Sig: Take 1 tablet (5 mg total) by mouth daily.   lisinopril (ZESTRIL) 20 MG tablet 30 tablet 2    Sig: Take 1 tablet (20 mg total) by mouth daily.   atorvastatin (LIPITOR) 20 MG tablet 30 tablet 2    Sig: Take 1 tablet (20 mg total) by mouth at bedtime.    Return in about 3 months (around 04/05/2022) for Follow-Up or next available chronic care mgmt.  Camillia Herter, NP

## 2022-01-03 ENCOUNTER — Ambulatory Visit (INDEPENDENT_AMBULATORY_CARE_PROVIDER_SITE_OTHER): Payer: Medicare Other | Admitting: Family

## 2022-01-03 VITALS — BP 122/86 | HR 85 | Temp 98.3°F | Resp 16 | Ht <= 58 in | Wt 143.0 lb

## 2022-01-03 DIAGNOSIS — I10 Essential (primary) hypertension: Secondary | ICD-10-CM | POA: Diagnosis not present

## 2022-01-03 DIAGNOSIS — Z23 Encounter for immunization: Secondary | ICD-10-CM | POA: Diagnosis not present

## 2022-01-03 DIAGNOSIS — E785 Hyperlipidemia, unspecified: Secondary | ICD-10-CM | POA: Diagnosis not present

## 2022-01-03 DIAGNOSIS — E213 Hyperparathyroidism, unspecified: Secondary | ICD-10-CM

## 2022-01-03 DIAGNOSIS — R42 Dizziness and giddiness: Secondary | ICD-10-CM | POA: Diagnosis not present

## 2022-01-03 DIAGNOSIS — R7303 Prediabetes: Secondary | ICD-10-CM | POA: Diagnosis not present

## 2022-01-03 DIAGNOSIS — R609 Edema, unspecified: Secondary | ICD-10-CM

## 2022-01-03 LAB — POCT GLYCOSYLATED HEMOGLOBIN (HGB A1C): Hemoglobin A1C: 5.9 % — AB (ref 4.0–5.6)

## 2022-01-03 MED ORDER — ATORVASTATIN CALCIUM 20 MG PO TABS
20.0000 mg | ORAL_TABLET | Freq: Every day | ORAL | 2 refills | Status: DC
Start: 2022-01-03 — End: 2022-04-03

## 2022-01-03 MED ORDER — LISINOPRIL 20 MG PO TABS: 20.0000 mg | ORAL_TABLET | Freq: Every day | ORAL | 2 refills | Status: DC

## 2022-01-03 MED ORDER — AMLODIPINE BESYLATE 5 MG PO TABS
5.0000 mg | ORAL_TABLET | Freq: Every day | ORAL | 2 refills | Status: DC
Start: 2022-01-03 — End: 2022-03-22

## 2022-01-03 NOTE — Patient Instructions (Signed)
Please call [facility] Endocrinology. Phone#: [phone] Address: [facility] [address] and [place], [place]  [address].   Hyperparathyroidism  Hyperparathyroidism is a condition that is caused by one or more overactive parathyroid glands. There are four parathyroid glands in the front of your neck on or near your thyroid gland. They are the size of a pea. These glands produce a chemical called parathyroid hormone, or PTH. Parathyroid glands release PTH when your blood calcium level is low to ensure a constant supply of calcium. Calcium is needed to make your bones, heart, and muscles healthy. There are two types of hyperparathyroidism: Primary hyperparathyroidism. Secondary hyperparathyroidism. What are the causes? The cause of this condition depends on the type of hyperparathyroidism that you have. Primary hyperparathyroidism is caused by: A benign tumor in one or more of the parathyroid glands (parathyroid adenoma). The adenoma releases too much PTH. This causes high levels of calcium in the blood and may lead to symptoms. PTH raises calcium levels by: Causing your bones to release calcium. Increasing the amount of calcium absorbed from the foods you eat. Making your kidneys retain too much calcium. Genes. In rare cases, primary hyperparathyroidism can be passed down through families. Secondary hyperparathyroidism is usually caused by conditions that cause low calcium. Low calcium triggers the parathyroid glands to make and release more PTH. The glands may become overactive (hyperplasia) and release too much PTH. These conditions are: Kidney failure. When kidneys fail, they are unable to make vitamin D or remove phosphorus, which leads to low calcium levels. Vitamin D deficiency. What increases the risk? You are more likely to develop this condition if: You are a woman. You are 50 years or older. You have chronic kidney disease. What are  the signs or symptoms? Mild hyperparathyroidism may not cause any symptoms. If you start to have symptoms, they may include: Muscle weakness. Aches and pains. Feeling tired (fatigue). Depression. Difficulty thinking. Bone pain or bone fracture from thinning bones. This is from too much calcium in the body. Back or groin pain from kidney stones. This happens if your kidneys retain too much calcium. Symptoms of more severe disease may include: Nausea and vomiting. Increased thirst. Bowel movements that are less frequent or more difficult than usual (constipation). Urinating more often than normal. Confusion. How is this diagnosed? Your health care provider may suspect hyperparathyroidism if the results of a routine blood test show a high level of calcium. In this case, your health care provider may do another blood test to check your PTH level. You may also have other tests to find out the cause of your condition and check for evidence of bone loss or kidney stones. These tests may include: A 24-hour urine collection to find out if you have primary or secondary hyperparathyroidism. A bone scan test to check bone density (DXA scan). Imaging tests of your kidney, such as X-ray, ultrasound, or CT scan. Blood tests for vitamin D and phosphorus. An ultrasound of your parathyroid glands. How is this treated? Mild cases of hyperparathyroidism may not need to be treated. If you are not treated, you will need to have regular blood tests to make sure your calcium and PTH levels are not getting too high. Surgery is the usual treatment if you have primary hyperparathyroidism. Surgery will usually cure the condition. Surgery may be recommended if: You have high calcium. You have low bone density. You have kidney stones. You have weak bones causing a broken bone (fracture).  You are younger than 44. Secondary hyperparathyroidism is managed by treating the underlying disorder. For chronic kidney  disease: Reducing phosphorus in the diet or taking phosphorus binders for high levels of phosphorus. A medicine that acts on the parathyroid glands to lower PTH (cinacalcet). Dialysis or kidney transplant. For vitamin D deficiency, you may be given vitamin D supplements. Follow these instructions at home: Eating and drinking  Follow instructions from your health care provider about eating or drinking. Take supplements only as told by your health care provider. Drink enough fluid to keep your urine pale yellow. General instructions Take over-the-counter and prescription medicines only as told by your health care provider. Exercise as told by your health care provider. Weight-bearing exercise is good for bone health. Ask your health care provider what exercises are safe for you. Return to your normal activities as told by your health care provider. Ask your health care provider what activities are safe for you. Keep all follow-up visits. Your health care provider will need to do tests regularly and adjust your treatment as needed. Where to find more information [employer] of Diabetes and Digestive and Kidney Diseases: [email or web address] [place]: [email or web address] Contact a health care provider if: Your symptoms do not get better with treatment. Your symptoms get worse. Summary Hyperparathyroidism is a condition caused by one or more overactive parathyroid glands. The parathyroid glands produce a chemical called parathyroid hormone, or PTH. Too much parathyroid hormone causes high levels of blood calcium and may lead to symptoms of hyperparathyroidism. There are two types of hyperparathyroidism: primary and secondary. If this condition is mild, you may not have any symptoms. Hyperparathyroidism is often diagnosed when a routine blood test finds high levels of calcium. Surgery is the best treatment for primary disease with symptoms. If surgery cannot be done, medicines may be of  help for people who have mild secondary disease or primary disease. This information is not intended to replace advice given to you by your health care provider. Make sure you discuss any questions you have with your health care provider. Document Revised: 05/06/2021 Document Reviewed: 05/06/2021 Elsevier Patient Education  [place].

## 2022-01-03 NOTE — Progress Notes (Signed)
.  Pt presents for chronic care management   -experiencing dizziness and balance is off

## 2022-01-04 LAB — LIPID PANEL
Chol/HDL Ratio: 2.3 ratio (ref 0.0–4.4)
Cholesterol, Total: 130 mg/dL (ref 100–199)
HDL: 57 mg/dL (ref >39)
LDL Chol Calc (NIH): 61 mg/dL (ref 0–99)
Triglycerides: 53 mg/dL (ref 0–149)
VLDL Cholesterol Cal: 12 mg/dL (ref 5–40)

## 2022-01-05 ENCOUNTER — Other Ambulatory Visit: Payer: Self-pay | Admitting: Family

## 2022-01-05 DIAGNOSIS — R42 Dizziness and giddiness: Secondary | ICD-10-CM

## 2022-01-05 NOTE — Telephone Encounter (Signed)
Order complete. 

## 2022-01-13 ENCOUNTER — Telehealth: Payer: Self-pay

## 2022-01-13 NOTE — Telephone Encounter (Signed)
Att to contact pt to verify if she went to appt w/Endocrinology today. Pt was referred back on 08/24 to Endocrinology

## 2022-01-13 NOTE — Telephone Encounter (Signed)
Pt returned call but cma was not available.  Please call pt back.

## 2022-01-13 NOTE — Telephone Encounter (Signed)
Copied from [place] [id number]. Topic: Referral - Request for Referral >> Jan 13, 2022  9:22 AM Everette C wrote: Has patient seen PCP for this complaint? Yes.   *If NO, is insurance requiring patient see PCP for this issue before PCP can refer them? Referral for which specialty: Endocrinology  Preferred provider/office: Patient has no preference but would like for them to accept their insurance  Reason for referral: Thyroid concerns

## 2022-01-24 ENCOUNTER — Telehealth: Payer: Self-pay | Admitting: Family

## 2022-01-24 NOTE — Telephone Encounter (Signed)
Pt dropping off  new paperwork for FMLA RE: Arna Snipe (daughter) , placed in provider bin and appt was made.

## 2022-01-25 NOTE — Progress Notes (Unsigned)
Patient ID: April Randall, female    DOB: 05-17-1950  MRN: [id number]  CC: Paperwork Completion   Subjective: April Randall is a 71 y.o. female who presents for paperwork completion.   Her concerns today include:  - Patient presents today with FMLA paperwork for completion on behalf of her daughter April Randall. Reports her daughter is employed at [employer]. States her daughter takes her to doctor's appointments as needed usually about 4 times monthly. Reports that while her daughter does not physically attend appointments with her she does wait outside in the parking lot for transport.  - Reports Endocrinology referral did not accept her health insurance and needs referral sent to a different office. - Reports she has not heard from ENT referral. - No further issues/concerns.    Patient Active Problem List   Diagnosis Date Noted   Prediabetes 05/11/2021   History of right knee joint replacement 01/29/2020   Hyperlipidemia 08/22/2019   Arthritis    Essential hypertension    S/P left unicompartmental knee replacement 12/04/2011   Arthritis of knee 08/30/2011     Current Outpatient Medications on File Prior to Visit  Medication Sig Dispense Refill   amLODipine (NORVASC) 5 MG tablet Take 1 tablet (5 mg total) by mouth daily. 30 tablet 2   ASPIRIN LOW DOSE 81 MG tablet Take 81 mg by mouth daily.     atorvastatin (LIPITOR) 20 MG tablet Take 1 tablet (20 mg total) by mouth at bedtime. 30 tablet 2   cetirizine (ZYRTEC) 10 MG tablet Take 1 tablet (10 mg total) by mouth daily. 30 tablet 11   lisinopril (ZESTRIL) 20 MG tablet Take 1 tablet (20 mg total) by mouth daily. 30 tablet 2   meclizine (ANTIVERT) 12.5 MG tablet TAKE 1 TABLET (12.5 MG TOTAL) BY MOUTH DAILY AS NEEDED FOR DIZZINESS. 30 tablet 2   predniSONE (DELTASONE) 20 MG tablet Take 1 tablet (20 mg total) by mouth daily with breakfast. 5 tablet 0   No current facility-administered medications on file prior to visit.     No Known Allergies  Social History   Socioeconomic History   Marital status: Single    Spouse name: Not on file   Number of children: Not on file   Years of education: Not on file   Highest education level: Not on file  Occupational History   Not on file  Tobacco Use   Smoking status: Never    Passive exposure: Never   Smokeless tobacco: Never  Substance and Sexual Activity   Alcohol use: No    Alcohol/week: 0.0 standard drinks of alcohol   Drug use: No   Sexual activity: Not Currently  Other Topics Concern   Not on file  Social History Narrative   Not on file   Social Determinants of Health   Financial Resource Strain: Not on file  Food Insecurity: Not on file  Transportation Needs: Not on file  Physical Activity: Not on file  Stress: Not on file  Social Connections: Not on file  Intimate Partner Violence: Not on file    Family History  Problem Relation Age of Onset   Hypertension Mother    Breast cancer Neg Hx     Past Surgical History:  Procedure Laterality Date   JOINT REPLACEMENT N/A    [employer] 08/20/2019   REPLACEMENT TOTAL KNEE Right     ROS: Review of Systems Negative except as stated above  PHYSICAL EXAM: BP 107/73 (BP Location: Left Arm, Patient Position: Sitting,  Cuff Size: Large)   Pulse 90   Temp 98.3 F (36.8 C)   Resp 16   Ht 4' 7.98" (1.422 m)   Wt 140 lb (63.5 kg)   SpO2 95%   BMI 31.41 kg/m   Physical Exam HENT:     Head: Normocephalic and atraumatic.  Eyes:     Extraocular Movements: Extraocular movements intact.     Conjunctiva/sclera: Conjunctivae normal.     Pupils: Pupils are equal, round, and reactive to light.  Cardiovascular:     Rate and Rhythm: Normal rate and regular rhythm.     Pulses: Normal pulses.     Heart sounds: Normal heart sounds.  Pulmonary:     Effort: Pulmonary effort is normal.     Breath sounds: Normal breath sounds.  Musculoskeletal:     Cervical back: Normal range of motion and neck  supple.  Neurological:     General: No focal deficit present.     Mental Status: She is alert and oriented to person, place, and time.  Psychiatric:        Mood and Affect: Mood normal.        Behavior: Behavior normal.    ASSESSMENT AND PLAN: 1. Encounter for completion of form with patient 2. Primary hypertension 3. Hyperlipidemia, unspecified hyperlipidemia type 4. Prediabetes - FMLA Certification of Health Care Provider for the Serious Health Condition completed in office on behalf of patient's daughter April Randall.   5. Hyperparathyroidism [facility]) - Message sent to referral coordinator, Maren Reamer, requesting referral to Endocrinology be sent to an office that accepts patient's health insurance.   6. Vertigo - Message sent to referral coordinator, Maren Reamer, for any updates on ENT referral.  Patient was given the opportunity to ask questions.  Patient verbalized understanding of the plan and was able to repeat key elements of the plan. Patient was given clear instructions to go to Emergency Department or return to medical center if symptoms don't improve, worsen, or new problems develop.The patient verbalized understanding.  Follow-up with primary provider as scheduled.   Camillia Herter, NP

## 2022-02-01 ENCOUNTER — Ambulatory Visit (INDEPENDENT_AMBULATORY_CARE_PROVIDER_SITE_OTHER): Payer: Medicare Other | Admitting: Family

## 2022-02-01 VITALS — BP 107/73 | HR 90 | Temp 98.3°F | Resp 16 | Ht <= 58 in | Wt 140.0 lb

## 2022-02-01 DIAGNOSIS — R7303 Prediabetes: Secondary | ICD-10-CM

## 2022-02-01 DIAGNOSIS — E785 Hyperlipidemia, unspecified: Secondary | ICD-10-CM | POA: Diagnosis not present

## 2022-02-01 DIAGNOSIS — I10 Essential (primary) hypertension: Secondary | ICD-10-CM | POA: Diagnosis not present

## 2022-02-01 DIAGNOSIS — Z0289 Encounter for other administrative examinations: Secondary | ICD-10-CM

## 2022-02-01 DIAGNOSIS — E213 Hyperparathyroidism, unspecified: Secondary | ICD-10-CM

## 2022-02-01 DIAGNOSIS — R42 Dizziness and giddiness: Secondary | ICD-10-CM

## 2022-02-01 NOTE — Progress Notes (Unsigned)
Pt presents for completion of paperwork -FMLA for daughter

## 2022-02-02 ENCOUNTER — Telehealth: Payer: Self-pay | Admitting: Family

## 2022-02-03 ENCOUNTER — Other Ambulatory Visit: Payer: Self-pay

## 2022-02-03 DIAGNOSIS — I10 Essential (primary) hypertension: Secondary | ICD-10-CM

## 2022-02-14 NOTE — Telephone Encounter (Signed)
Copied from [place] [id number]. Topic: General - Other >> Feb 14, 2022  1:35 PM April Randall wrote: Reason for CRM: The patient would like to be contacted when their FMLA form is ready to be picked up   Please contact the patient further when possible

## 2022-02-14 NOTE — Telephone Encounter (Signed)
Paperwork placed up front for pickup, Bluford Kaufmann, will f/u with patient to advise

## 2022-02-21 ENCOUNTER — Encounter: Payer: Self-pay | Admitting: Physician Assistant

## 2022-02-21 ENCOUNTER — Ambulatory Visit: Payer: Medicare Other | Admitting: Physician Assistant

## 2022-02-21 VITALS — BP 132/90 | HR 81 | Ht <= 58 in | Wt 143.0 lb

## 2022-02-21 DIAGNOSIS — I10 Essential (primary) hypertension: Secondary | ICD-10-CM

## 2022-02-21 DIAGNOSIS — L309 Dermatitis, unspecified: Secondary | ICD-10-CM | POA: Diagnosis not present

## 2022-02-21 MED ORDER — TRIAMCINOLONE ACETONIDE 0.1 % EX CREA: 1.0000 | TOPICAL_CREAM | Freq: Two times a day (BID) | CUTANEOUS | 0 refills | Status: DC

## 2022-02-21 NOTE — Progress Notes (Unsigned)
Established Patient Office Visit  Subjective   Patient ID: April Randall, female    DOB: [birth year]  Age: 71 y.o. MRN: [id number]  Chief Complaint  Patient presents with   Rash    Rash located on breast and mid back     States that she started having a rash on her chest and upper back.  States that this started approximately 1 week ago.  Describes it as itchy, denies pain.  States that she did start taking meloxicam approximately 1 month ago.  Denies any new lotions, body washes, detergents.  Does endorse that she ate dragon fruit for the first time close to the time of onset of rash.  States that she did consume it for 2 days.  No one else in the house with similar rash.  States that she did try hydrocortisone and Benadryl with a small amount of relief.  States that she does not check her blood pressure at home, states that she is taking her blood pressure medications as directed, and does not have follow-up with her primary care provider at the beginning of the year.    Due to language barrier, an interpreter was present during the history-taking and subsequent discussion (and for part of the physical exam) with this patient.     Past Medical History:  Diagnosis Date   Arthritis    Hypertension    Social History   Socioeconomic History   Marital status: Single    Spouse name: Not on file   Number of children: Not on file   Years of education: Not on file   Highest education level: Not on file  Occupational History   Not on file  Tobacco Use   Smoking status: Never    Passive exposure: Never   Smokeless tobacco: Never  Substance and Sexual Activity   Alcohol use: No    Alcohol/week: 0.0 standard drinks of alcohol   Drug use: No   Sexual activity: Not Currently  Other Topics Concern   Not on file  Social History Narrative   Not on file   Social Determinants of Health   Financial Resource Strain: Not on file  Food Insecurity: Not on file  Transportation  Needs: Not on file  Physical Activity: Not on file  Stress: Not on file  Social Connections: Not on file  Intimate Partner Violence: Not on file   Family History  Problem Relation Age of Onset   Hypertension Mother    Breast cancer Neg Hx    No Known Allergies  Review of Systems  Constitutional: Negative.   HENT: Negative.    Eyes: Negative.   Respiratory:  Negative for shortness of breath.   Cardiovascular:  Negative for chest pain.  Gastrointestinal: Negative.   Genitourinary: Negative.   Musculoskeletal: Negative.   Skin:  Positive for itching and rash.  Neurological: Negative.   Endo/Heme/Allergies: Negative.   Psychiatric/Behavioral: Negative.        Objective:     BP (!) 132/90 (BP Location: Left Arm)   Pulse 81   Ht '4\' 9"'$  (1.448 m)   Wt 143 lb (64.9 kg)   SpO2 98%   BMI 30.94 kg/m  BP Readings from Last 3 Encounters:  02/21/22 (!) 132/90  02/01/22 107/73  01/03/22 122/86   Wt Readings from Last 3 Encounters:  02/21/22 143 lb (64.9 kg)  02/01/22 140 lb (63.5 kg)  01/03/22 143 lb (64.9 kg)      Physical Exam Vitals and nursing note reviewed.  Constitutional:      Appearance: Normal appearance.  HENT:     Head: Normocephalic and atraumatic.     Right Ear: External ear normal.     Left Ear: External ear normal.     Nose: Nose normal.     Mouth/Throat:     Mouth: Mucous membranes are moist.     Pharynx: Oropharynx is clear.  Eyes:     Extraocular Movements: Extraocular movements intact.     Conjunctiva/sclera: Conjunctivae normal.     Pupils: Pupils are equal, round, and reactive to light.  Cardiovascular:     Rate and Rhythm: Normal rate and regular rhythm.     Pulses: Normal pulses.     Heart sounds: Normal heart sounds.  Pulmonary:     Effort: Pulmonary effort is normal.     Breath sounds: Normal breath sounds.  Musculoskeletal:     Cervical back: Normal range of motion and neck supple.  Skin:    Comments: Scattered erythematic  vesicles in different stages of healing noted on chest near both breasts, small amount of erythematic vesicles in different stages on middle upper back close to bra line  Neurological:     General: No focal deficit present.     Mental Status: She is alert and oriented to person, place, and time.  Psychiatric:        Mood and Affect: Mood normal.        Thought Content: Thought content normal.        Judgment: Judgment normal.       Assessment & Plan:   Problem List Items Addressed This Visit   None Visit Diagnoses     Dermatitis    -  Primary   Relevant Medications   triamcinolone cream (KENALOG) 0.1 %   Elevated blood pressure reading in office with diagnosis of hypertension         1. Dermatitis Trial Kenalog.  Patient education given on supportive care.  Red flags given for prompt reevaluation. - triamcinolone cream (KENALOG) 0.1 %; Apply 1 Application topically 2 (two) times daily.  Dispense: 30 g; Refill: 0  2. Elevated blood pressure reading in office with diagnosis of hypertension Patient encouraged to check blood pressure on a daily basis, keep a written log and have available for all office visits.  Patient has upcoming appointment with primary care provider on April 03, 2022.   I have reviewed the patient's medical history (PMH, PSH, Social History, Family History, Medications, and allergies) , and have been updated if relevant. I spent 30 minutes reviewing chart and  face to face time with patient.     Return if symptoms worsen or fail to improve.    Loraine Grip Mayers, PA-C

## 2022-02-21 NOTE — Patient Instructions (Addendum)
You are going to use Kenalog cream twice a day on the affected area until it improves.    Please let us now if there is anything else we can do for you.  April Rad, PA-C Physician Assistant [facility] Medicine [email or web address]  Rash, Adult A rash is a change in the color of your skin. A rash can also change the way your skin feels. There are many different conditions and factors that can cause a rash. Some rashes may disappear after a few days, but some may last for a few weeks. Common causes of rashes include: Viral infections, such as: Colds. Measles. Hand, foot, and mouth disease. Bacterial infections, such as: Scarlet fever. Impetigo. Fungal infections, such as Candida. Allergic reactions to food, medicines, or skin care products. Follow these instructions at home: The goal of treatment is to stop the itching and keep the rash from spreading. Pay attention to any changes in your symptoms. Follow these instructions to help with your condition: Medicine Take or apply over-the-counter and prescription medicines only as told by your health care provider. These may include: Corticosteroid creams to treat red or swollen skin. Anti-itch lotions. Oral allergy medicines (antihistamines). Oral corticosteroids for severe symptoms.  Skin care Apply cool compresses to the affected areas. Do not scratch or rub your skin. Avoid covering the rash. Make sure the rash is exposed to air as much as possible. Managing itching and discomfort Avoid hot showers or baths, which can make itching worse. A cold shower may help. Try taking a bath with: Epsom salts. Follow manufacturer instructions on the packaging. You can get these at your local pharmacy or grocery store. Baking soda. Pour a small amount into the bath as told by your health care provider. Colloidal oatmeal. Follow manufacturer instructions on the packaging. You can get this at your  local pharmacy or grocery store. Try applying baking soda paste to your skin. Stir water into baking soda until it reaches a paste-like consistency. Try applying calamine lotion. This is an over-the-counter lotion that helps to relieve itchiness. Keep cool and out of the sun. Sweating and being hot can make itching worse. General instructions  Rest as needed. Drink enough fluid to keep your urine pale yellow. Wear loose-fitting clothing. Avoid scented soaps, detergents, and perfumes. Use gentle soaps, detergents, perfumes, and other cosmetic products. Avoid any substance that causes your rash. Keep a journal to help track what causes your rash. Write down: What you eat. What cosmetic products you use. What you drink. What you wear. This includes jewelry. Keep all follow-up visits as told by your health care provider. This is important. Contact a health care provider if: You sweat at night. You lose weight. You urinate more than normal. You urinate less than normal, or you notice that your urine is a darker color than usual. You feel weak. You vomit. Your skin or the whites of your eyes look yellow (jaundice). Your skin: Tingles. Is numb. Your rash: Does not go away after several days. Gets worse. You are: Unusually thirsty. More tired than normal. You have: New symptoms. Pain in your abdomen. A fever. Diarrhea. Get help right away if you: Have a fever and your symptoms suddenly get worse. Develop confusion. Have a severe headache or a stiff neck. Have severe joint pains or stiffness. Have a seizure. Develop a rash that covers all or most of your body. The rash may or may not be painful. Develop blisters that: Are on top of the rash.  Grow larger or grow together. Are painful. Are inside your nose or mouth. Develop a rash that: Looks like purple pinprick-sized spots all over your body. Has a "bull's eye" or looks like a target. Is not related to sun exposure, is red  and painful, and causes your skin to peel. Summary A rash is a change in the color of your skin. Some rashes disappear after a few days, but some may last for a few weeks. The goal of treatment is to stop the itching and keep the rash from spreading. Take or apply over-the-counter and prescription medicines only as told by your health care provider. Contact a health care provider if you have new or worsening symptoms. Keep all follow-up visits as told by your health care provider. This is important. This information is not intended to replace advice given to you by your health care provider. Make sure you discuss any questions you have with your health care provider. Document Revised: 09/13/2021 Document Reviewed: 12/23/2020 Elsevier Patient Education  [place].

## 2022-02-22 ENCOUNTER — Encounter: Payer: Self-pay | Admitting: Physician Assistant

## 2022-03-07 ENCOUNTER — Telehealth: Payer: Self-pay | Admitting: Family

## 2022-03-07 NOTE — Telephone Encounter (Signed)
Left message for patient to call back and schedule Medicare Annual Wellness Visit (AWV) either virtually or phone  Left  my April Randall number [phone]   Last AWV 06/09/20 please schedule with Nurse Health Adviser   45 min for awv-i and in office appointments 30 min for awv-s  phone/virtual appointments

## 2022-03-14 ENCOUNTER — Ambulatory Visit (INDEPENDENT_AMBULATORY_CARE_PROVIDER_SITE_OTHER): Payer: Medicare Other

## 2022-03-14 VITALS — Ht <= 58 in | Wt 143.0 lb

## 2022-03-14 DIAGNOSIS — Z0000 Encounter for general adult medical examination without abnormal findings: Secondary | ICD-10-CM

## 2022-03-14 DIAGNOSIS — Z1211 Encounter for screening for malignant neoplasm of colon: Secondary | ICD-10-CM

## 2022-03-14 NOTE — Progress Notes (Signed)
Subjective:   April Randall is a 71 y.o. female who presents for Medicare Annual (Subsequent) preventive examination.  Review of Systems    Virtual Visit via Telephone Note  I connected with  April Randall on 03/14/22 at  3:00 PM EST by telephone and verified that I am speaking with the correct person using two identifiers.  Location: Patient: Home Provider: Office Persons participating in the virtual visit: patient/Nurse Health Advisor   I discussed the limitations, risks, security and privacy concerns of performing an evaluation and management service by telephone and the availability of in person appointments. The patient expressed understanding and agreed to proceed.  Interactive audio and video telecommunications were attempted between this nurse and patient, however failed, due to patient having technical difficulties OR patient did not have access to video capability.  We continued and completed visit with audio only.  Some vital signs may be absent or patient reported.   Criselda Peaches, LPN  Cardiac Risk Factors include: advanced age (>61mn, >>38women);hypertension     Objective:    Today's Vitals   03/14/22 1535  Weight: 143 lb (64.9 kg)  Height: '4\' 9"'$  (1.448 m)  PainSc: 0-No pain   Body mass index is 30.94 kg/m.     03/14/2022    3:41 PM 09/11/2017    9:42 AM 03/27/2015   12:53 AM 03/26/2015    9:51 PM  Advanced Directives  Does Patient Have a Medical Advance Directive? No Yes No No  Type of Advance Directive  [place]   Does patient want to make changes to medical advance directive?  No - Patient declined    Copy of [place]?  No - copy requested    Would patient like information on creating a medical advance directive? No - Patient declined  No - patient declined information No - patient declined information    Current Medications (verified) Outpatient Encounter Medications as of 03/14/2022   Medication Sig   amLODipine (NORVASC) 5 MG tablet Take 1 tablet (5 mg total) by mouth daily.   ASPIRIN LOW DOSE 81 MG tablet Take 81 mg by mouth daily.   atorvastatin (LIPITOR) 20 MG tablet Take 1 tablet (20 mg total) by mouth at bedtime.   cetirizine (ZYRTEC) 10 MG tablet Take 1 tablet (10 mg total) by mouth daily. (Patient not taking: Reported on 02/21/2022)   lisinopril (ZESTRIL) 20 MG tablet Take 1 tablet (20 mg total) by mouth daily. (Patient not taking: Reported on 02/21/2022)   meclizine (ANTIVERT) 12.5 MG tablet TAKE 1 TABLET (12.5 MG TOTAL) BY MOUTH DAILY AS NEEDED FOR DIZZINESS. (Patient not taking: Reported on 02/21/2022)   predniSONE (DELTASONE) 20 MG tablet Take 1 tablet (20 mg total) by mouth daily with breakfast. (Patient not taking: Reported on 02/21/2022)   triamcinolone cream (KENALOG) 0.1 % Apply 1 Application topically 2 (two) times daily.   No facility-administered encounter medications on file as of 03/14/2022.    Allergies (verified) Patient has no known allergies.   History: Past Medical History:  Diagnosis Date   Arthritis    Hypertension    Past Surgical History:  Procedure Laterality Date   JOINT REPLACEMENT N/A    [employer]rees[employer] 08/20/2019   REPLACEMENT TOTAL KNEE Right    Family History  Problem Relation Age of Onset   Hypertension Mother    Breast cancer Neg Hx    Social History   Socioeconomic History   Marital status: Single  Spouse name: Not on file   Number of children: Not on file   Years of education: Not on file   Highest education level: Not on file  Occupational History   Not on file  Tobacco Use   Smoking status: Never    Passive exposure: Never   Smokeless tobacco: Never  Substance and Sexual Activity   Alcohol use: No    Alcohol/week: 0.0 standard drinks of alcohol   Drug use: No   Sexual activity: Not Currently  Other Topics Concern   Not on file  Social History Narrative   Not on file   Social Determinants of Health    Financial Resource Strain: Low Risk  (03/14/2022)   Overall Financial Resource Strain (CARDIA)    Difficulty of Paying Living Expenses: Not hard at all  Food Insecurity: No Food Insecurity (03/14/2022)   Hunger Vital Sign    Worried About Running Out of Food in the Last Year: Never true    Ran Out of Food in the Last Year: Never true  Transportation Needs: No Transportation Needs (03/14/2022)   PRAPARE - Hydrologist (Medical): No    Lack of Transportation (Non-Medical): No  Physical Activity: Inactive (03/14/2022)   Exercise Vital Sign    Days of Exercise per Week: 0 days    Minutes of Exercise per Session: 0 min  Stress: No Stress Concern Present (03/14/2022)   [place]    Feeling of Stress : Not at all  Social Connections: [place] (03/14/2022)   Social Connection and Isolation Panel [NHANES]    Frequency of Communication with Friends and Family: More than three times a week    Frequency of Social Gatherings with Friends and Family: More than three times a week    Attends Religious Services: More than 4 times per year    Active Member of [employer] or Organizations: Yes    Attends Music therapist: More than 4 times per year    Marital Status: Married    Tobacco Counseling Counseling given: Not Answered   Clinical Intake:  Pre-visit preparation completed: No  Pain : No/denies pain Pain Score: 0-No pain     BMI - recorded: 30.94 Nutritional Status: BMI > 30  Obese Nutritional Risks: None Diabetes: No  How often do you need to have someone help you when you read instructions, pamphlets, or other written materials from your doctor or pharmacy?: 1 - Never  Diabetic? No  Interpreter Needed?: No  Information entered by :: Rolene Arbour LPN   Activities of Daily Living    03/14/2022    3:40 PM  In your present state of health, do you have any  difficulty performing the following activities:  Hearing? 0  Vision? 0  Difficulty concentrating or making decisions? 0  Walking or climbing stairs? 0  Dressing or bathing? 0  Doing errands, shopping? 0  Preparing Food and eating ? N  Using the Toilet? N  In the past six months, have you accidently leaked urine? N  Do you have problems with loss of bowel control? N  Managing your Medications? N  Managing your Finances? N  Housekeeping or managing your Housekeeping? N    Patient Care Team: Camillia Herter, NP as PCP - General (Nurse Practitioner)  Indicate any recent Medical Services you may have received from other than [facility] providers in the past year (date may be approximate).  Assessment:   This is a routine wellness examination for April Randall.  Hearing/Vision screen Hearing Screening - Comments:: Denies hearing difficulties   Vision Screening - Comments:: Wears rx glasses - up to date with routine eye exams with  Dr Idolina Primer  Dietary issues and exercise activities discussed:     Goals Addressed               This Visit's Progress     No Current goals (pt-stated)         Depression Screen    03/14/2022    3:39 PM 02/01/2022    1:42 PM 08/30/2021   10:06 AM 06/22/2021    1:52 PM 05/11/2021    9:16 AM 02/08/2021   11:01 AM 12/13/2020   10:28 AM  PHQ 2/9 Scores  PHQ - 2 Score 0 0 0 0 0 0 0    Fall Risk    03/14/2022    3:40 PM 06/22/2021    1:52 PM 06/09/2020    2:43 PM 10/29/2019    9:56 AM 11/18/2016    4:00 PM  [place] in the past year? 0 0 0 0 No  Number falls in past yr: 0 0  0   Injury with Fall? 0 0  0   Risk for fall due to : No Fall Risks      Follow up Falls prevention discussed Falls evaluation completed       [place]:  Any stairs in or around the home? Yes  If so, are there any without handrails? No  Home free of loose throw rugs in walkways, pet beds, electrical cords, etc? Yes  Adequate lighting  in your home to reduce risk of falls? Yes   ASSISTIVE DEVICES UTILIZED TO PREVENT FALLS:  Life alert? No  Use of a cane, walker or w/c? No  Grab bars in the bathroom? No  Shower chair or bench in showNo  Elevated toilet seat or a handicapped toileer? t? No   TIMED UP AND GO:  Was the test performed? No . Audio Visit    Cognitive Function:    06/09/2020    2:26 PM  MMSE - Mini Mental State Exam  Orientation to time 5  Orientation to Place 5  Registration 3  Attention/ Calculation 5  Recall 3  Language- name 2 objects 2  Language- repeat 1  Language- follow 3 step command 3  Language- read & follow direction 1  Write a sentence 1  Copy design 0  Total score 29        03/14/2022    3:41 PM  6CIT Screen  What Year? 0 points  What month? 0 points  What time? 0 points  Count back from 20 0 points  Months in reverse 2 points  Repeat phrase 4 points  Total Score 6 points    Immunizations Immunization History  Administered Date(s) Administered   Fluad Quad(high Dose 65+) 01/03/2022   Influenza-Unspecified 02/13/2020, 01/08/2021   Moderna Sars-Covid-2 Vaccination 10/25/2021   PFIZER(Purple Top)SARS-COV-2 Vaccination 05/29/2019, 06/25/2019   Pneumococcal Conjugate-13 10/29/2019   Pneumococcal Polysaccharide-23 02/08/2021   Tdap 10/19/2011   Zoster Recombinat (Shingrix) 05/11/2021    TDAP status: Due, Education has been provided regarding the importance of this vaccine. Advised may receive this vaccine at local pharmacy or Health Dept. Aware to provide a copy of the vaccination record if obtained from local pharmacy or Health Dept. Verbalized acceptance and understanding.  Flu  Vaccine status: Up to date  Pneumococcal vaccine status: Up to date  Covid-19 vaccine status: Completed vaccines  Qualifies for Shingles Vaccine? Yes   Zostavax completed Yes   Shingrix Completed?: Yes  Screening Tests Health Maintenance  Topic Date Due   DTaP/Tdap/Td (2 - Td or  Tdap) 10/18/2021   COVID-19 Vaccine (4 - 2023-24 season) 03/30/2022 (Originally 12/20/2021)   Zoster Vaccines- Shingrix (2 of 2) 06/13/2022 (Originally 07/06/2021)   COLONOSCOPY (Pts 45-8yr Insurance coverage will need to be confirmed)  03/15/2023 (Originally 02/06/1996)   Medicare Annual Wellness ([place]  03/15/2023   MAMMOGRAM  08/24/2023   Pneumonia Vaccine 71 Years old  Completed   INFLUENZA VACCINE  Completed   DEXA SCAN  Completed   Hepatitis C Screening  Completed   HPV VACCINES  Aged Out    Health Maintenance  Health Maintenance Due  Topic Date Due   DTaP/Tdap/Td (2 - Td or Tdap) 10/18/2021    Colorectal cancer screening: Referral to GI placed 03/14/22. Pt aware the office will call re: appt.  Mammogram status: Completed 08/23/21. Repeat every year  Bone Density status: Completed 05/09/17. Results reflect: Bone density results: OSTEOPENIA. Repeat every   years.  Lung Cancer Screening: (Low Dose CT Chest recommended if Age 71-80years, 30 pack-year currently smoking OR have quit w/in 15years.) does not qualify.     Additional Screening:  Hepatitis C Screening: does qualify; Completed 10/29/19  Vision Screening: Recommended annual ophthalmology exams for early detection of glaucoma and other disorders of the eye. Is the patient up to date with their annual eye exam?  Yes  Who is the provider or what is the name of the office in which the patient attends annual eye exams? Dr DIdolina PrimerIf pt is not established with a provider, would they like to be referred to a provider to establish care? No .   Dental Screening: Recommended annual dental exams for proper oral hygiene  Community Resource Referral / Chronic Care Management:  CRR required this visit?  No   CCM required this visit?  No      Plan:     I have personally reviewed and noted the following in the patient's chart:   Medical and social history Use of alcohol, tobacco or illicit drugs  Current medications and  supplements including opioid prescriptions. Patient is not currently taking opioid prescriptions. Functional ability and status Nutritional status Physical activity Advanced directives List of other physicians Hospitalizations, surgeries, and ER visits in previous 12 months Vitals Screenings to include cognitive, depression, and falls Referrals and appointments  In addition, I have reviewed and discussed with patient certain preventive protocols, quality metrics, and best practice recommendations. A written personalized care plan for preventive services as well as general preventive health recommendations were provided to patient.     BCriselda Peaches LPN   [id number]  Nurse Notes: None

## 2022-03-14 NOTE — Patient Instructions (Addendum)
April Randall , Thank you for taking time to come for your Medicare Wellness Visit. I appreciate your ongoing commitment to your health goals. Please review the following plan we discussed and let me know if I can assist you in the future.   These are the goals we discussed:  Goals       No Current goals (pt-stated)        This is a list of the screening recommended for you and due dates:  Health Maintenance  Topic Date Due   DTaP/Tdap/Td vaccine (2 - Td or Tdap) 10/18/2021   COVID-19 Vaccine (4 - 2023-24 season) 03/30/2022*   Zoster (Shingles) Vaccine (2 of 2) 06/13/2022*   Colon Cancer Screening  03/15/2023*   Medicare Annual Wellness Visit  03/15/2023   Mammogram  08/24/2023   Pneumonia Vaccine  Completed   Flu Shot  Completed   DEXA scan (bone density measurement)  Completed   Hepatitis C Screening: USPSTF Recommendation to screen - Ages 18-79 yo.  Completed   HPV Vaccine  Aged Out  *Topic was postponed. The date shown is not the original due date.    Advanced directives: Advance directive discussed with you today. Even though you declined this today, please call our office should you change your mind, and we can give you the proper paperwork for you to fill out.   Conditions/risks identified: None  Next appointment: Follow up in one year for your annual wellness visit     Preventive Care 65 Years and Older, Female Preventive care refers to lifestyle choices and visits with your health care provider that can promote health and wellness. What does preventive care include? A yearly physical exam. This is also called an annual well check. Dental exams once or twice a year. Routine eye exams. Ask your health care provider how often you should have your eyes checked. Personal lifestyle choices, including: Daily care of your teeth and gums. Regular physical activity. Eating a healthy diet. Avoiding tobacco and drug use. Limiting alcohol use. Practicing safe sex. Taking  low-dose aspirin every day. Taking vitamin and mineral supplements as recommended by your health care provider. What happens during an annual well check? The services and screenings done by your health care provider during your annual well check will depend on your age, overall health, lifestyle risk factors, and family history of disease. Counseling  Your health care provider may ask you questions about your: Alcohol use. Tobacco use. Drug use. Emotional well-being. Home and relationship well-being. Sexual activity. Eating habits. History of falls. Memory and ability to understand (cognition). Work and work Statistician. Reproductive health. Screening  You may have the following tests or measurements: Height, weight, and BMI. Blood pressure. Lipid and cholesterol levels. These may be checked every 5 years, or more frequently if you are over 70 years old. Skin check. Lung cancer screening. You may have this screening every year starting at age 3 if you have a 30-pack-year history of smoking and currently smoke or have quit within the past 15 years. Fecal occult blood test (FOBT) of the stool. You may have this test every year starting at age 46. Flexible sigmoidoscopy or colonoscopy. You may have a sigmoidoscopy every 5 years or a colonoscopy every 10 years starting at age 83. Hepatitis C blood test. Hepatitis B blood test. Sexually transmitted disease (STD) testing. Diabetes screening. This is done by checking your blood sugar (glucose) after you have not eaten for a while (fasting). You may have this done every  1-3 years. Bone density scan. This is done to screen for osteoporosis. You may have this done starting at age 63. Mammogram. This may be done every 1-2 years. Talk to your health care provider about how often you should have regular mammograms. Talk with your health care provider about your test results, treatment options, and if necessary, the need for more tests. Vaccines   Your health care provider may recommend certain vaccines, such as: Influenza vaccine. This is recommended every year. Tetanus, diphtheria, and acellular pertussis (Tdap, Td) vaccine. You may need a Td booster every 10 years. Zoster vaccine. You may need this after age 59. Pneumococcal 13-valent conjugate (PCV13) vaccine. One dose is recommended after age 55. Pneumococcal polysaccharide (PPSV23) vaccine. One dose is recommended after age 47. Talk to your health care provider about which screenings and vaccines you need and how often you need them. This information is not intended to replace advice given to you by your health care provider. Make sure you discuss any questions you have with your health care provider. Document Released: 04/09/2015 Document Revised: 12/01/2015 Document Reviewed: 01/12/2015 Elsevier Interactive Patient Education  2017 [employer] Prevention in the Home Falls can cause injuries. They can happen to people of all ages. There are many things you can do to make your home safe and to help prevent falls. What can I do on the outside of my home? Regularly fix the edges of walkways and driveways and fix any cracks. Remove anything that might make you trip as you walk through a door, such as a raised step or threshold. Trim any bushes or trees on the path to your home. Use bright outdoor lighting. Clear any walking paths of anything that might make someone trip, such as rocks or tools. Regularly check to see if handrails are loose or broken. Make sure that both sides of any steps have handrails. Any raised decks and porches should have guardrails on the edges. Have any leaves, snow, or ice cleared regularly. Use sand or salt on walking paths during winter. Clean up any spills in your garage right away. This includes oil or grease spills. What can I do in the bathroom? Use night lights. Install grab bars by the toilet and in the tub and shower. Do not use towel  bars as grab bars. Use non-skid mats or decals in the tub or shower. If you need to sit down in the shower, use a plastic, non-slip stool. Keep the floor dry. Clean up any water that spills on the floor as soon as it happens. Remove soap buildup in the tub or shower regularly. Attach bath mats securely with double-sided non-slip rug tape. Do not have throw rugs and other things on the floor that can make you trip. What can I do in the bedroom? Use night lights. Make sure that you have a light by your bed that is easy to reach. Do not use any sheets or blankets that are too big for your bed. They should not hang down onto the floor. Have a firm chair that has side arms. You can use this for support while you get dressed. Do not have throw rugs and other things on the floor that can make you trip. What can I do in the kitchen? Clean up any spills right away. Avoid walking on wet floors. Keep items that you use a lot in easy-to-reach places. If you need to reach something above you, use a strong step stool that has a  grab bar. Keep electrical cords out of the way. Do not use floor polish or wax that makes floors slippery. If you must use wax, use non-skid floor wax. Do not have throw rugs and other things on the floor that can make you trip. What can I do with my stairs? Do not leave any items on the stairs. Make sure that there are handrails on both sides of the stairs and use them. Fix handrails that are broken or loose. Make sure that handrails are as long as the stairways. Check any carpeting to make sure that it is firmly attached to the stairs. Fix any carpet that is loose or worn. Avoid having throw rugs at the top or bottom of the stairs. If you do have throw rugs, attach them to the floor with carpet tape. Make sure that you have a light switch at the top of the stairs and the bottom of the stairs. If you do not have them, ask someone to add them for you. What else can I do to help  prevent falls? Wear shoes that: Do not have high heels. Have rubber bottoms. Are comfortable and fit you well. Are closed at the toe. Do not wear sandals. If you use a stepladder: Make sure that it is fully opened. Do not climb a closed stepladder. Make sure that both sides of the stepladder are locked into place. Ask someone to hold it for you, if possible. Clearly mark and make sure that you can see: Any grab bars or handrails. First and last steps. Where the edge of each step is. Use tools that help you move around (mobility aids) if they are needed. These include: Canes. Walkers. Scooters. Crutches. Turn on the lights when you go into a dark area. Replace any light bulbs as soon as they burn out. Set up your furniture so you have a clear path. Avoid moving your furniture around. If any of your floors are uneven, fix them. If there are any pets around you, be aware of where they are. Review your medicines with your doctor. Some medicines can make you feel dizzy. This can increase your chance of falling. Ask your doctor what other things that you can do to help prevent falls. This information is not intended to replace advice given to you by your health care provider. Make sure you discuss any questions you have with your health care provider. Document Released: 01/07/2009 Document Revised: 08/19/2015 Document Reviewed: 04/17/2014 Elsevier Interactive Patient Education  2017 [employer].

## 2022-03-22 ENCOUNTER — Other Ambulatory Visit: Payer: Self-pay | Admitting: Family

## 2022-03-22 DIAGNOSIS — I10 Essential (primary) hypertension: Secondary | ICD-10-CM

## 2022-03-28 ENCOUNTER — Encounter: Payer: Self-pay | Admitting: Gastroenterology

## 2022-03-31 NOTE — Progress Notes (Unsigned)
Patient ID: April Randall, female    DOB: May 13, 1950  MRN: [id number]  CC: Chronic Care Management   Subjective: April Randall is a 72 y.o. female who presents for chronic care management.   Her concerns today include:  HTN - Amlodipine, Lisinopril  HLD - Atorvastatin   Endo - hyperparathyroid  ENT - vertigo   Patient Active Problem List   Diagnosis Date Noted   Prediabetes 05/11/2021   History of right knee joint replacement 01/29/2020   Hyperlipidemia 08/22/2019   Arthritis    Essential hypertension    S/P left unicompartmental knee replacement 12/04/2011   Arthritis of knee 08/30/2011     Current Outpatient Medications on File Prior to Visit  Medication Sig Dispense Refill   amLODipine (NORVASC) 5 MG tablet TAKE 1 TABLET BY MOUTH DAILY 90 tablet 1   ASPIRIN LOW DOSE 81 MG tablet Take 81 mg by mouth daily.     atorvastatin (LIPITOR) 20 MG tablet Take 1 tablet (20 mg total) by mouth at bedtime. 30 tablet 2   cetirizine (ZYRTEC) 10 MG tablet Take 1 tablet (10 mg total) by mouth daily. (Patient not taking: Reported on 02/21/2022) 30 tablet 11   lisinopril (ZESTRIL) 20 MG tablet Take 1 tablet (20 mg total) by mouth daily. (Patient not taking: Reported on 02/21/2022) 30 tablet 2   meclizine (ANTIVERT) 12.5 MG tablet TAKE 1 TABLET (12.5 MG TOTAL) BY MOUTH DAILY AS NEEDED FOR DIZZINESS. (Patient not taking: Reported on 02/21/2022) 30 tablet 2   predniSONE (DELTASONE) 20 MG tablet Take 1 tablet (20 mg total) by mouth daily with breakfast. (Patient not taking: Reported on 02/21/2022) 5 tablet 0   triamcinolone cream (KENALOG) 0.1 % Apply 1 Application topically 2 (two) times daily. 30 g 0   No current facility-administered medications on file prior to visit.    No Known Allergies  Social History   Socioeconomic History   Marital status: Single    Spouse name: Not on file   Number of children: Not on file   Years of education: Not on file   Highest education level: Not  on file  Occupational History   Not on file  Tobacco Use   Smoking status: Never    Passive exposure: Never   Smokeless tobacco: Never  Substance and Sexual Activity   Alcohol use: No    Alcohol/week: 0.0 standard drinks of alcohol   Drug use: No   Sexual activity: Not Currently  Other Topics Concern   Not on file  Social History Narrative   Not on file   Social Determinants of Health   Financial Resource Strain: Low Risk  (03/14/2022)   Overall Financial Resource Strain (CARDIA)    Difficulty of Paying Living Expenses: Not hard at all  Food Insecurity: No Food Insecurity (03/14/2022)   Hunger Vital Sign    Worried About Running Out of Food in the Last Year: Never true    Ran Out of Food in the Last Year: Never true  Transportation Needs: No Transportation Needs (03/14/2022)   PRAPARE - Hydrologist (Medical): No    Lack of Transportation (Non-Medical): No  Physical Activity: Inactive (03/14/2022)   Exercise Vital Sign    Days of Exercise per Week: 0 days    Minutes of Exercise per Session: 0 min  Stress: No Stress Concern Present (03/14/2022)   [place]    Feeling of Stress : Not at  all  Social Connections: Socially Integrated (03/14/2022)   Social Connection and Isolation Panel [NHANES]    Frequency of Communication with Friends and Family: More than three times a week    Frequency of Social Gatherings with Friends and Family: More than three times a week    Attends Religious Services: More than 4 times per year    Active Member of [employer] or Organizations: Yes    Attends Music therapist: More than 4 times per year    Marital Status: Married  Human resources officer Violence: Not At Risk (03/14/2022)   Humiliation, Afraid, Rape, and Kick questionnaire    Fear of Current or Ex-Partner: No    Emotionally Abused: No    Physically Abused: No    Sexually Abused: No     Family History  Problem Relation Age of Onset   Hypertension Mother    Breast cancer Neg Hx     Past Surgical History:  Procedure Laterality Date   JOINT REPLACEMENT N/A    Phreesia 08/20/2019   REPLACEMENT TOTAL KNEE Right     ROS: Review of Systems Negative except as stated above  PHYSICAL EXAM: There were no vitals taken for this visit.  Physical Exam  {female adult master:310786} {female adult master:310785}     Latest Ref Rng & Units 11/09/2021   12:00 AM 08/30/2021    1:01 PM 02/08/2021   11:19 AM  CMP  Glucose 70 - 99 mg/dL 126  127  79   BUN 8 - 27 mg/dL '11  9  13   '$ Creatinine 0.57 - 1.00 mg/dL 1.00  1.07  0.82   Sodium 134 - 144 mmol/L 136  139  141   Potassium 3.5 - 5.2 mmol/L 3.8  3.8  3.7   Chloride 96 - 106 mmol/L 102  104  105   CO2 20 - 29 mmol/L '20  21  20   '$ Calcium 8.7 - 10.3 mg/dL 10.6  10.8  10.3    Lipid Panel     Component Value Date/Time   CHOL 130 01/03/2022 1249   TRIG 53 01/03/2022 1249   HDL 57 01/03/2022 1249   CHOLHDL 2.3 01/03/2022 1249   CHOLHDL 3.0 03/27/2015 0410   VLDL 7 03/27/2015 0410   LDLCALC 61 01/03/2022 1249    CBC    Component Value Date/Time   WBC 4.3 10/29/2019 1027   WBC 4.8 03/26/2015 2142   RBC 4.55 10/29/2019 1027   RBC 4.69 03/26/2015 2142   HGB 14.0 10/29/2019 1027   HCT 41.2 10/29/2019 1027   PLT 198 10/29/2019 1027   MCV 91 10/29/2019 1027   MCH 30.8 10/29/2019 1027   MCH 30.1 03/26/2015 2142   MCHC 34.0 10/29/2019 1027   MCHC 33.5 03/26/2015 2142   RDW 12.1 10/29/2019 1027   LYMPHSABS 1.3 10/29/2019 1027   MONOABS 0.3 03/26/2015 2142   EOSABS 0.1 10/29/2019 1027   BASOSABS 0.0 10/29/2019 1027    ASSESSMENT AND PLAN:  There are no diagnoses linked to this encounter.   Patient was given the opportunity to ask questions.  Patient verbalized understanding of the plan and was able to repeat key elements of the plan. Patient was given clear instructions to go to Emergency Department or return  to medical center if symptoms don't improve, worsen, or new problems develop.The patient verbalized understanding.   No orders of the defined types were placed in this encounter.    Requested Prescriptions    No prescriptions requested or  ordered in this encounter    No follow-ups on file.  April Herter, NP

## 2022-04-03 ENCOUNTER — Ambulatory Visit (INDEPENDENT_AMBULATORY_CARE_PROVIDER_SITE_OTHER): Payer: 59 | Admitting: Family

## 2022-04-03 ENCOUNTER — Encounter: Payer: Self-pay | Admitting: Family

## 2022-04-03 VITALS — BP 116/82 | HR 99 | Temp 98.3°F | Resp 16 | Ht <= 58 in | Wt 145.8 lb

## 2022-04-03 DIAGNOSIS — I10 Essential (primary) hypertension: Secondary | ICD-10-CM | POA: Diagnosis not present

## 2022-04-03 DIAGNOSIS — E785 Hyperlipidemia, unspecified: Secondary | ICD-10-CM

## 2022-04-03 MED ORDER — ATORVASTATIN CALCIUM 20 MG PO TABS: 20.0000 mg | ORAL_TABLET | Freq: Every day | ORAL | 2 refills | Status: DC

## 2022-04-03 MED ORDER — LISINOPRIL 20 MG PO TABS: 20.0000 mg | ORAL_TABLET | Freq: Every day | ORAL | 2 refills | Status: DC

## 2022-04-03 MED ORDER — BLOOD PRESSURE MONITOR DEVI: 0 refills | Status: DC

## 2022-04-03 NOTE — Progress Notes (Signed)
.  Pt presents for chronic care management   -pt experiencing insomnia

## 2022-04-04 LAB — BASIC METABOLIC PANEL
BUN/Creatinine Ratio: 12 (ref 12–28)
BUN: 11 mg/dL (ref 8–27)
CO2: 22 mmol/L (ref 20–29)
Calcium: 11.4 mg/dL — ABNORMAL HIGH (ref 8.7–10.3)
Chloride: 103 mmol/L (ref 96–106)
Creatinine, Ser: 0.9 mg/dL (ref 0.57–1.00)
Glucose: 126 mg/dL — ABNORMAL HIGH (ref 70–99)
Potassium: 3.6 mmol/L (ref 3.5–5.2)
Sodium: 139 mmol/L (ref 134–144)
eGFR: 68 mL/min/{1.73_m2} (ref >59)

## 2022-04-07 ENCOUNTER — Telehealth: Payer: Self-pay

## 2022-04-07 NOTE — Telephone Encounter (Signed)
Pt given lab results per notes of A. Minette Brine NP on 04/07/2022. Pt verbalized understanding. Pt stated she is going to keep the appt with endocrinology 04/11/22.

## 2022-04-19 LAB — HM DEXA SCAN

## 2022-05-08 ENCOUNTER — Encounter: Payer: Self-pay | Admitting: Family

## 2022-05-15 ENCOUNTER — Ambulatory Visit (AMBULATORY_SURGERY_CENTER): Payer: 59

## 2022-05-15 VITALS — Ht <= 58 in | Wt 147.2 lb

## 2022-05-15 DIAGNOSIS — Z1211 Encounter for screening for malignant neoplasm of colon: Secondary | ICD-10-CM

## 2022-05-15 MED ORDER — NA SULFATE-K SULFATE-MG SULF 17.5-3.13-1.6 GM/177ML PO SOLN: 1.0000 | Freq: Once | ORAL | 0 refills | Status: AC

## 2022-05-15 NOTE — Progress Notes (Signed)
No egg or soy allergy known to patient  No issues known to pt with past sedation with any surgeries or procedures Patient denies ever being told they had issues or difficulty with intubation  No FH of Malignant Hyperthermia Pt is not on diet pills Pt is not on  home 02  Pt is not on blood thinners  Pt denies issues with constipation  No A fib or A flutter Have any cardiac testing pending-- NO Pt instructed to use Singlecare.com or GoodRx for a price reduction on prep   Patient's chart reviewed by Osvaldo Angst CNRA prior to previsit and patient appropriate for the [employer].  Previsit completed and red dot placed by patient's name on their procedure day (on provider's schedule).

## 2022-05-29 ENCOUNTER — Encounter: Payer: Self-pay | Admitting: Gastroenterology

## 2022-05-29 ENCOUNTER — Ambulatory Visit (AMBULATORY_SURGERY_CENTER): Payer: 59 | Admitting: Gastroenterology

## 2022-05-29 VITALS — BP 107/69 | HR 71 | Temp 96.2°F | Resp 16 | Ht <= 58 in | Wt 147.2 lb

## 2022-05-29 DIAGNOSIS — Z1211 Encounter for screening for malignant neoplasm of colon: Secondary | ICD-10-CM

## 2022-05-29 DIAGNOSIS — D123 Benign neoplasm of transverse colon: Secondary | ICD-10-CM | POA: Diagnosis not present

## 2022-05-29 HISTORY — PX: COLONOSCOPY: SHX174

## 2022-05-29 MED ORDER — SODIUM CHLORIDE 0.9 % IV SOLN: 500.0000 mL | Freq: Once | INTRAVENOUS | Status: DC

## 2022-05-29 NOTE — Patient Instructions (Signed)
Await pathology results. Handouts on polyps and diverticulosis provided.  YOU HAD AN ENDOSCOPIC PROCEDURE TODAY AT [facility]:   Refer to the procedure report that was given to you for any specific questions about what was found during the examination.  If the procedure report does not answer your questions, please call your gastroenterologist to clarify.  If you requested that your care partner not be given the details of your procedure findings, then the procedure report has been included in a sealed envelope for you to review at your convenience later.  YOU SHOULD EXPECT: Some feelings of bloating in the abdomen. Passage of more gas than usual.  Walking can help get rid of the air that was put into your GI tract during the procedure and reduce the bloating. If you had a lower endoscopy (such as a colonoscopy or flexible sigmoidoscopy) you may notice spotting of blood in your stool or on the toilet paper. If you underwent a bowel prep for your procedure, you may not have a normal bowel movement for a few days.  Please Note:  You might notice some irritation and congestion in your nose or some drainage.  This is from the oxygen used during your procedure.  There is no need for concern and it should clear up in a day or so.  SYMPTOMS TO REPORT IMMEDIATELY:  Following lower endoscopy (colonoscopy or flexible sigmoidoscopy):  Excessive amounts of blood in the stool  Significant tenderness or worsening of abdominal pains  Swelling of the abdomen that is new, acute  Fever of 100F or higher  For urgent or emergent issues, a gastroenterologist can be reached at any hour by calling [phone]. Do not use MyChart messaging for urgent concerns.    DIET:  We do recommend a small meal at first, but then you may proceed to your regular diet.  Drink plenty of fluids but you should avoid alcoholic beverages for 24 hours.  ACTIVITY:  You should plan to take it easy for the rest of  today and you should NOT DRIVE or use heavy machinery until tomorrow (because of the sedation medicines used during the test).    FOLLOW UP: Our staff will call the number listed on your records the next business day following your procedure.  We will call around 7:15- 8:00 am to check on you and address any questions or concerns that you may have regarding the information given to you following your procedure. If we do not reach you, we will leave a message.     If any biopsies were taken you will be contacted by phone or by letter within the next 1-3 weeks.  Please call us at [phone] if you have not heard about the biopsies in 3 weeks.    SIGNATURES/CONFIDENTIALITY: You and/or your care partner have signed paperwork which will be entered into your electronic medical record.  These signatures attest to the fact that that the information above on your After Visit Summary has been reviewed and is understood.  Full responsibility of the confidentiality of this discharge information lies with you and/or your care-partner.

## 2022-05-29 NOTE — Progress Notes (Signed)
Pt resting comfortably. VSS. Airway intact. SBAR complete to RN. All questions answered.   

## 2022-05-29 NOTE — Progress Notes (Signed)
Called to room to assist during endoscopic procedure.  Patient ID and intended procedure confirmed with present staff. Received instructions for my participation in the procedure from the performing physician.  

## 2022-05-29 NOTE — Progress Notes (Signed)
Pt's states no medical or surgical changes since previsit or office visit. 

## 2022-05-29 NOTE — Op Note (Addendum)
[place] Patient Name: April Randall Procedure Date: 05/29/2022 10:14 AM MRN: [id number] Endoscopist: Thornton Park MD, MD, [id number] Age: 72 Referring MD:  Date of Birth: 11/08/1950 Gender: Female Account #: [id number] Procedure:                Colonoscopy Indications:              Screening for colorectal malignant neoplasm                           Prior colonoscopy >10 years ago                           No known family history of colon cancer or polyps Medicines:                Monitored Anesthesia Care Procedure:                Pre-Anesthesia Assessment:                           - Prior to the procedure, a History and Physical                            was performed, and patient medications and                            allergies were reviewed. The patient's tolerance of                            previous anesthesia was also reviewed. The risks                            and benefits of the procedure and the sedation                            options and risks were discussed with the patient.                            All questions were answered, and informed consent                            was obtained. Prior Anticoagulants: The patient has                            taken no anticoagulant or antiplatelet agents. ASA                            Grade Assessment: II - A patient with mild systemic                            disease. After reviewing the risks and benefits,                            the patient was deemed in satisfactory condition to  undergo the procedure.                           After obtaining informed consent, the colonoscope                            was passed under direct vision. Throughout the                            procedure, the patient's blood pressure, pulse, and                            oxygen saturations were monitored continuously. The                            Olympus CF-HQ190L SN  [id number] was introduced through                            the anus and advanced to the 3 cm into the ileum. A                            second forward view of the right colon was                            performed. The colonoscopy was performed with                            moderate difficulty due to a redundant colon and a                            tortuous colon. Successful completion of the                            procedure was aided by changing the patient's                            position, withdrawing and reinserting the scope and                            applying abdominal pressure. The patient tolerated                            the procedure well. The quality of the bowel                            preparation was good. The ileocecal valve,                            appendiceal orifice, and rectum were photographed. Scope In: 10:43:40 AM Scope Out: 11:04:30 AM Scope Withdrawal Time: 0 hours 12 minutes 54 seconds  Total Procedure Duration: 0 hours 20 minutes 50 seconds  Findings:                 The perianal and digital rectal examinations  were                            normal.                           Multiple large-mouthed, medium-mouthed and                            small-mouthed diverticula were found in the sigmoid                            colon and descending colon.                           A 2 mm polyp was found in the transverse colon. The                            polyp was flat. The polyp was removed with a cold                            snare. Resection and retrieval were complete.                            Estimated blood loss was minimal.                           The exam was otherwise without abnormality on                            direct and retroflexion views. Complications:            No immediate complications. Estimated Blood Loss:     Estimated blood loss was minimal. Impression:               - Diverticulosis in the sigmoid colon  and in the                            descending colon.                           - One 2 mm polyp in the transverse colon, removed                            with a cold snare. Resected and retrieved.                           - The examination was otherwise normal on direct                            and retroflexion views. Recommendation:           - Patient has a contact number available for                            emergencies. The signs and symptoms of potential  delayed complications were discussed with the                            patient. Return to normal activities tomorrow.                            Written discharge instructions were provided to the                            patient.                           - Continue present medications.                           - Await pathology results.                           - Repeat colonoscopy is not recommended due to                            current age (53 years or older) for surveillance.                           - Emerging evidence supports eating a diet of                            fruits, vegetables, grains, calcium, and yogurt                            while reducing red meat and alcohol may reduce the                            risk of colon cancer.                           - Follow a high fiber diet. Drink at least 64                            ounces of water daily. Add a daily stool bulking                            agent such as psyllium (an exampled would be                            Metamucil).                           - Thank you for allowing me to be involved in your                            colon cancer prevention. Thornton Park MD, MD 05/29/2022 11:11:16 AM This report has been signed electronically.

## 2022-05-29 NOTE — Progress Notes (Addendum)
   Referring Provider: Camillia Herter, NP Primary Care Physician:  Camillia Herter, NP  Indication for Colonoscopy:  Colon cancer screening   IMPRESSION:  Need for colon cancer screening Appropriate candidate for monitored anesthesia care  PLAN: Colonoscopy in the [place] today   HPI: April Randall is a 72 y.o. female presents for screening colonoscopy.  Reports a prior colonoscopy at [facility] >10 years ago. I am unable to locate the records in [facility].  No known family history of colon cancer or polyps. No family history of uterine/endometrial cancer, pancreatic cancer or gastric/stomach cancer.   Past Medical History:  Diagnosis Date   Arthritis    Hypertension     Past Surgical History:  Procedure Laterality Date   JOINT REPLACEMENT N/A    Phreesia 08/20/2019   REPLACEMENT TOTAL KNEE Right     Current Outpatient Medications  Medication Sig Dispense Refill   amLODipine (NORVASC) 5 MG tablet TAKE 1 TABLET BY MOUTH DAILY 90 tablet 1   ASPIRIN LOW DOSE 81 MG tablet Take 81 mg by mouth daily.     atorvastatin (LIPITOR) 20 MG tablet Take 1 tablet (20 mg total) by mouth at bedtime. 30 tablet 2   Blood Pressure Monitor DEVI Use as directed to check home blood pressure 2-3 times a week 1 each 0   cetirizine (ZYRTEC) 10 MG tablet Take 1 tablet (10 mg total) by mouth daily. (Patient not taking: Reported on 02/21/2022) 30 tablet 11   lisinopril (ZESTRIL) 20 MG tablet Take 1 tablet (20 mg total) by mouth daily. 30 tablet 2   meclizine (ANTIVERT) 12.5 MG tablet TAKE 1 TABLET (12.5 MG TOTAL) BY MOUTH DAILY AS NEEDED FOR DIZZINESS. (Patient not taking: Reported on 02/21/2022) 30 tablet 2   triamcinolone cream (KENALOG) 0.1 % Apply 1 Application topically 2 (two) times daily. (Patient not taking: Reported on 05/15/2022) 30 g 0   No current facility-administered medications for this visit.    Allergies as of 05/29/2022   (No Known Allergies)    Family History  Problem Relation Age  of Onset   Hypertension Mother    Breast cancer Neg Hx    Colon cancer Neg Hx    Colon polyps Neg Hx    Esophageal cancer Neg Hx    Rectal cancer Neg Hx    Stomach cancer Neg Hx      Physical Exam: General:   Alert,  well-nourished, pleasant and cooperative in NAD Head:  Normocephalic and atraumatic. Eyes:  Sclera clear, no icterus.   Conjunctiva pink. Mouth:  No deformity or lesions.   Neck:  Supple; no masses or thyromegaly. Lungs:  Clear throughout to auscultation.   No wheezes. Heart:  Regular rate and rhythm; no murmurs. Abdomen:  Soft, non-tender, nondistended, normal bowel sounds, no rebound or guarding.  Msk:  Symmetrical. No boney deformities LAD: No inguinal or umbilical LAD Extremities:  No clubbing or edema. Neurologic:  Alert and  oriented x4;  grossly nonfocal Skin:  No obvious rash or bruise. Psych:  Alert and cooperative. Normal mood and affect.     Studies/Results: No results found.    Angelize Ryce L. Tarri Glenn, MD, MPH 05/29/2022, 10:00 AM

## 2022-05-30 ENCOUNTER — Telehealth: Payer: Self-pay | Admitting: *Deleted

## 2022-05-30 ENCOUNTER — Other Ambulatory Visit: Payer: Self-pay | Admitting: Family

## 2022-05-30 DIAGNOSIS — I10 Essential (primary) hypertension: Secondary | ICD-10-CM

## 2022-05-30 NOTE — Telephone Encounter (Signed)
Attempted to call patient for their post-procedure follow-up call. No answer. Left voicemail.   

## 2022-05-31 NOTE — Telephone Encounter (Signed)
Unable to refill per protocol, Rx request is too soon. Last refill 03/22/22 for 90 and 1 refill.  Requested Prescriptions  Pending Prescriptions Disp Refills   amLODipine (NORVASC) 5 MG tablet [Pharmacy Med Name: amLODIPine Besylate 5 MG Oral Tablet] 100 tablet 2    Sig: TAKE 1 TABLET BY MOUTH DAILY     Cardiovascular: Calcium Channel Blockers 2 Passed - 05/30/2022 10:30 PM      Passed - Last BP in normal range    BP Readings from Last 1 Encounters:  05/29/22 107/69         Passed - Last Heart Rate in normal range    Pulse Readings from Last 1 Encounters:  05/29/22 71         Passed - Valid encounter within last 6 months    Recent Outpatient Visits           1 month ago Primary hypertension   [place] [facility], [place], NP   3 months ago Encounter for completion of form with patient   [facility], Amy J, NP   4 months ago Primary hypertension   [place] [facility], Amy J, NP   9 months ago Essential (primary) hypertension   [place] [facility], Flonnie H[place], NP   11 months ago Productive cough    [facility] at [facility], Loraine Grip, [place]       Future Appointments             In 1 month Camillia Herter, NP [place] at [facility]

## 2022-06-26 ENCOUNTER — Encounter: Payer: Self-pay | Admitting: Gastroenterology

## 2022-06-27 NOTE — Progress Notes (Signed)
Patient ID: April Randall, female    DOB: June 20, 1950  MRN: [id number]  CC: Chronic Care Management   Subjective: April Randall is a 72 y.o. female who presents for chronic care management.   Her concerns today include:  - Doing well on blood pressure medications, no issues/concerns. She denies red flag symptoms such as but not limited to chest pain, shortness of breath, worst headache of life, nausea/vomiting.  - Doing well on cholesterol medication, no issues/concerns.  - Needs an allergy medication. Using her grandson's allergy medication at home.  - No further issues/concerns for discussion today.   Patient Active Problem List   Diagnosis Date Noted   Prediabetes 05/11/2021   History of right knee joint replacement 01/29/2020   Hyperlipidemia 08/22/2019   Arthritis    Essential hypertension    S/P left unicompartmental knee replacement 12/04/2011   Arthritis of knee 08/30/2011     Current Outpatient Medications on File Prior to Visit  Medication Sig Dispense Refill   ASPIRIN LOW DOSE 81 MG tablet Take 81 mg by mouth daily.     Blood Pressure Monitor DEVI Use as directed to check home blood pressure 2-3 times a week 1 each 0   No current facility-administered medications on file prior to visit.    No Known Allergies  Social History   Socioeconomic History   Marital status: Single    Spouse name: Not on file   Number of children: Not on file   Years of education: Not on file   Highest education level: Not on file  Occupational History   Not on file  Tobacco Use   Smoking status: Never    Passive exposure: Never   Smokeless tobacco: Never  Vaping Use   Vaping Use: Never used  Substance and Sexual Activity   Alcohol use: No    Alcohol/week: 0.0 standard drinks of alcohol   Drug use: No   Sexual activity: Not Currently    Birth control/protection: Post-menopausal  Other Topics Concern   Not on file  Social History Narrative   Not on file   Social  Determinants of Health   Financial Resource Strain: Low Risk  (03/14/2022)   Overall Financial Resource Strain (CARDIA)    Difficulty of Paying Living Expenses: Not hard at all  Food Insecurity: No Food Insecurity (03/14/2022)   Hunger Vital Sign    Worried About Running Out of Food in the Last Year: Never true    Ran Out of Food in the Last Year: Never true  Transportation Needs: No Transportation Needs (03/14/2022)   PRAPARE - Administrator, Civil Service (Medical): No    Lack of Transportation (Non-Medical): No  Physical Activity: Inactive (03/14/2022)   Exercise Vital Sign    Days of Exercise per Week: 0 days    Minutes of Exercise per Session: 0 min  Stress: No Stress Concern Present (03/14/2022)   [employer] of Occupational Health - Occupational Stress Questionnaire    Feeling of Stress : Not at all  Social Connections: Socially Integrated (03/14/2022)   Social Connection and Isolation Panel [NHANES]    Frequency of Communication with Friends and Family: More than three times a week    Frequency of Social Gatherings with Friends and Family: More than three times a week    Attends Religious Services: More than 4 times per year    Active Member of [employer] or Organizations: Yes    Attends Banker Meetings: More than 4  times per year    Marital Status: Married  Catering manager Violence: Not At Risk (03/14/2022)   Humiliation, Afraid, Rape, and Kick questionnaire    Fear of Current or Ex-Partner: No    Emotionally Abused: No    Physically Abused: No    Sexually Abused: No    Family History  Problem Relation Age of Onset   Hypertension Mother    Breast cancer Neg Hx    Colon cancer Neg Hx    Colon polyps Neg Hx    Esophageal cancer Neg Hx    Rectal cancer Neg Hx    Stomach cancer Neg Hx     Past Surgical History:  Procedure Laterality Date   COLONOSCOPY  05/29/2022   JOINT REPLACEMENT N/A    Phreesia 08/20/2019   REPLACEMENT TOTAL  KNEE Right     ROS: Review of Systems Negative except as stated above  PHYSICAL EXAM: BP 129/86 (BP Location: Right Arm, Patient Position: Sitting, Cuff Size: Normal)   Pulse 74   Temp 98.1 F (36.7 C)   Resp 14   Ht  (1.448 m)   Wt 144 lb (65.3 kg)   SpO2 98%   BMI 31.16 kg/m   Physical Exam HENT:     Head: Normocephalic and atraumatic.  Eyes:     Extraocular Movements: Extraocular movements intact.     Conjunctiva/sclera: Conjunctivae normal.     Pupils: Pupils are equal, round, and reactive to light.  Cardiovascular:     Rate and Rhythm: Normal rate and regular rhythm.     Pulses: Normal pulses.     Heart sounds: Normal heart sounds.  Pulmonary:     Effort: Pulmonary effort is normal.     Breath sounds: Normal breath sounds.  Musculoskeletal:     Cervical back: Normal range of motion and neck supple.  Neurological:     General: No focal deficit present.     Mental Status: She is alert and oriented to person, place, and time.  Psychiatric:        Mood and Affect: Mood normal.        Behavior: Behavior normal.     ASSESSMENT AND PLAN: 1. Primary hypertension - Continue Amlodipine and Lisinopril as prescribed.  - Counseled on blood pressure goal of less than 140/90, low-sodium, DASH diet, medication compliance, 150 minutes of moderate intensity exercise per week as tolerated. Discussed medication compliance, adverse effects. - Follow-up with primary provider in 6 months or sooner if needed.  - amLODipine (NORVASC) 5 MG tablet; Take 1 tablet (5 mg total) by mouth daily.  Dispense: 90 tablet; Refill: 0 - lisinopril (ZESTRIL) 20 MG tablet; Take 1 tablet (20 mg total) by mouth daily.  Dispense: 90 tablet; Refill: 0  2. Hyperlipidemia, unspecified hyperlipidemia type - Continue Atorvastatin as prescribed.  - Follow-up with primary provider in 6 months or sooner if needed.  - atorvastatin (LIPITOR) 20 MG tablet; Take 1 tablet (20 mg total) by mouth at bedtime.   Dispense: 90 tablet; Refill: 0  3. Prediabetes - Routine screening.  - POCT glycosylated hemoglobin (Hb A1C)  4. Perennial allergic rhinitis - Cetirizine as prescribed. Counseled on medication adherence/adverse effects.  - Follow-up with primary provider as scheduled.  - cetirizine (ZYRTEC) 5 MG tablet; Take 1 tablet (5 mg total) by mouth daily.  Dispense: 30 tablet; Refill: 1   Patient was given the opportunity to ask questions.  Patient verbalized understanding of the plan and was able to repeat key elements  of the plan. Patient was given clear instructions to go to Emergency Department or return to medical center if symptoms don't improve, worsen, or new problems develop.The patient verbalized understanding.   Orders Placed This Encounter  Procedures   POCT glycosylated hemoglobin (Hb A1C)     Requested Prescriptions   Signed Prescriptions Disp Refills   amLODipine (NORVASC) 5 MG tablet 90 tablet 0    Sig: Take 1 tablet (5 mg total) by mouth daily.   atorvastatin (LIPITOR) 20 MG tablet 90 tablet 0    Sig: Take 1 tablet (20 mg total) by mouth at bedtime.   lisinopril (ZESTRIL) 20 MG tablet 90 tablet 0    Sig: Take 1 tablet (20 mg total) by mouth daily.   cetirizine (ZYRTEC) 5 MG tablet 30 tablet 1    Sig: Take 1 tablet (5 mg total) by mouth daily.    Return in about 6 months (around 01/02/2023) for Follow-Up or next available chronic care mgmt .  Rema Fendt, NP

## 2022-07-03 ENCOUNTER — Encounter: Payer: Self-pay | Admitting: Family

## 2022-07-03 ENCOUNTER — Ambulatory Visit (INDEPENDENT_AMBULATORY_CARE_PROVIDER_SITE_OTHER): Payer: 59 | Admitting: Family

## 2022-07-03 VITALS — BP 129/86 | HR 74 | Temp 98.1°F | Resp 14 | Ht <= 58 in | Wt 144.0 lb

## 2022-07-03 DIAGNOSIS — I10 Essential (primary) hypertension: Secondary | ICD-10-CM | POA: Diagnosis not present

## 2022-07-03 DIAGNOSIS — J3089 Other allergic rhinitis: Secondary | ICD-10-CM | POA: Diagnosis not present

## 2022-07-03 DIAGNOSIS — E785 Hyperlipidemia, unspecified: Secondary | ICD-10-CM

## 2022-07-03 DIAGNOSIS — R7303 Prediabetes: Secondary | ICD-10-CM | POA: Diagnosis not present

## 2022-07-03 LAB — POCT GLYCOSYLATED HEMOGLOBIN (HGB A1C): Hemoglobin A1C: 5.6 % (ref 4.0–5.6)

## 2022-07-03 MED ORDER — ATORVASTATIN CALCIUM 20 MG PO TABS: 20.0000 mg | ORAL_TABLET | Freq: Every day | ORAL | 0 refills | Status: DC

## 2022-07-03 MED ORDER — LISINOPRIL 20 MG PO TABS
20.0000 mg | ORAL_TABLET | Freq: Every day | ORAL | 0 refills | Status: DC
Start: 2022-07-03 — End: 2022-10-26

## 2022-07-03 MED ORDER — AMLODIPINE BESYLATE 5 MG PO TABS: 5.0000 mg | ORAL_TABLET | Freq: Every day | ORAL | 0 refills | Status: DC

## 2022-07-03 MED ORDER — CETIRIZINE HCL 5 MG PO TABS
5.0000 mg | ORAL_TABLET | Freq: Every day | ORAL | 1 refills | Status: DC
Start: 2022-07-03 — End: 2022-08-07

## 2022-07-03 NOTE — Progress Notes (Signed)
Pt here for chronic care management   Complaining of sinus congestion, possible seasonal allergies

## 2022-07-22 ENCOUNTER — Other Ambulatory Visit: Payer: Self-pay | Admitting: Family

## 2022-07-22 DIAGNOSIS — I10 Essential (primary) hypertension: Secondary | ICD-10-CM

## 2022-07-24 NOTE — Telephone Encounter (Signed)
Rx was sent to local pharmacy on 07/03/22 #90/0.   Requested Prescriptions  Pending Prescriptions Disp Refills   amLODipine (NORVASC) 5 MG tablet [Pharmacy Med Name: amLODIPine Besylate 5 MG Oral Tablet] 100 tablet 2    Sig: TAKE 1 TABLET BY MOUTH DAILY     Cardiovascular: Calcium Channel Blockers 2 Passed - 07/22/2022 10:00 PM      Passed - Last BP in normal range    BP Readings from Last 1 Encounters:  07/03/22 129/86         Passed - Last Heart Rate in normal range    Pulse Readings from Last 1 Encounters:  07/03/22 74         Passed - Valid encounter within last 6 months    Recent Outpatient Visits           3 weeks ago Primary hypertension   [place] [facility], [place], [place]   3 months ago Primary hypertension   [place] [facility], Amy J, NP   5 months ago Encounter for completion of form with patient   [facility], Amy J, NP   6 months ago Primary hypertension   [place] [facility] at [facility], Amy J, NP   10 months ago Essential (primary) hypertension   [place] [facility] at [facility], [place], [place]       Future Appointments             In 5 months Rema Fendt, NP [facility] at [facility]

## 2022-07-31 ENCOUNTER — Other Ambulatory Visit: Payer: Self-pay | Admitting: Family

## 2022-07-31 DIAGNOSIS — Z9229 Personal history of other drug therapy: Secondary | ICD-10-CM

## 2022-07-31 NOTE — Addendum Note (Signed)
Addended by: Nena Polio on: 07/31/2022 04:21 PM   Modules accepted: Orders

## 2022-07-31 NOTE — Telephone Encounter (Signed)
Medication Refill - Medication: ASPIRIN LOW DOSE 81 MG tablet   Has the patient contacted their pharmacy? No. ( Preferred Pharmacy (with phone number or street name):  [id number] - [place], [place] - [address] AT [address]  [address] [place] [address]  Phone: [phone] Fax: [phone]  Hours: Not open 24 hours     Has the patient been seen for an appointment in the last year OR does the patient have an upcoming appointment? Yes.    Agent: Please be advised that RX refills may take up to 3 business days. We ask that you follow-up with your pharmacy.

## 2022-08-01 NOTE — Telephone Encounter (Signed)
Requested medications are due for refill today.  Unsure  Requested medications are on the active medications list.  yes  Last refill. 10/27/2021  Future visit scheduled.   yes  Notes to clinic.  Medication is historical.    Requested Prescriptions  Pending Prescriptions Disp Refills   ASPIRIN LOW DOSE 81 MG tablet 30 tablet     Sig: Take 1 tablet (81 mg total) by mouth daily.     Analgesics:  NSAIDS - aspirin Passed - 07/31/2022  4:21 PM      Passed - Cr in normal range and within 360 days    Creatinine, Ser  Date Value Ref Range Status  04/03/2022 0.90 0.57 - 1.00 mg/dL Final         Passed - eGFR is 10 or above and within 360 days    GFR calc Af Amer  Date Value Ref Range Status  10/29/2019 66 >59 mL/min/1.73 Final    Comment:    **[facility] currently reports eGFR in compliance with the current**   recommendations of the [employer]. [facility] will   update reporting as new guidelines are published from the NKF-ASN   Task force.    GFR calc non Af Amer  Date Value Ref Range Status  10/29/2019 57 (L) >59 mL/min/1.73 Final   eGFR  Date Value Ref Range Status  04/03/2022 68 >59 mL/min/1.73 Final         Passed - Patient is not pregnant      Passed - Valid encounter within last 12 months    Recent Outpatient Visits           4 weeks ago Primary hypertension   [place] [facility], Amy J, NP   4 months ago Primary hypertension   [place] [facility], Amy J, NP   6 months ago Encounter for completion of form with patient   [facility] at [facility], Amy J, NP   7 months ago Primary hypertension   [place] [facility] at [facility], Amy J, NP   11 months ago Essential (primary) hypertension   [place] [facility] at [facility], [place], [place]       Future Appointments             In 5 months Rema Fendt, NP [facility] at [facility]

## 2022-08-01 NOTE — Telephone Encounter (Signed)
Complete

## 2022-08-05 ENCOUNTER — Other Ambulatory Visit: Payer: Self-pay | Admitting: Family

## 2022-08-05 DIAGNOSIS — J3089 Other allergic rhinitis: Secondary | ICD-10-CM

## 2022-08-07 NOTE — Telephone Encounter (Signed)
Requested Prescriptions  Pending Prescriptions Disp Refills   cetirizine (ZYRTEC) 5 MG tablet [Pharmacy Med Name: CETIRIZINE HCL 5 MG TABLET] 90 tablet 0    Sig: TAKE 1 TABLET BY MOUTH EVERY DAY     Ear, Nose, and Throat:  Antihistamines 2 Passed - 08/05/2022  8:32 AM      Passed - Cr in normal range and within 360 days    Creatinine, Ser  Date Value Ref Range Status  04/03/2022 0.90 0.57 - 1.00 mg/dL Final         Passed - Valid encounter within last 12 months    Recent Outpatient Visits           1 month ago Primary hypertension   [place] [facility], Amy J, NP   4 months ago Primary hypertension   [place] [facility] at [facility], Amy J, NP   6 months ago Encounter for completion of form with patient   [facility], Amy J, NP   7 months ago Primary hypertension   [place] [facility] at [facility], Amy J, NP   11 months ago Essential (primary) hypertension   [place] [facility] at [facility], [place], [place]       Future Appointments             In 5 months Rema Fendt, NP [facility] at [facility]

## 2022-10-06 ENCOUNTER — Other Ambulatory Visit: Payer: Self-pay | Admitting: Family

## 2022-10-06 DIAGNOSIS — I10 Essential (primary) hypertension: Secondary | ICD-10-CM

## 2022-10-09 NOTE — Telephone Encounter (Signed)
Requested Prescriptions  Pending Prescriptions Disp Refills   amLODipine (NORVASC) 5 MG tablet [Pharmacy Med Name: amLODIPine Besylate 5 MG Oral Tablet] 90 tablet 0    Sig: TAKE 1 TABLET BY MOUTH DAILY     Cardiovascular: Calcium Channel Blockers 2 Passed - 10/06/2022 10:44 PM      Passed - Last BP in normal range    BP Readings from Last 1 Encounters:  07/03/22 129/86         Passed - Last Heart Rate in normal range    Pulse Readings from Last 1 Encounters:  07/03/22 74         Passed - Valid encounter within last 6 months    Recent Outpatient Visits           3 months ago Primary hypertension   [place] [facility], [place], [place]   6 months ago Primary hypertension   [place] [facility], Amy J, NP   8 months ago Encounter for completion of form with patient   [facility] at [facility], Amy J, NP   9 months ago Primary hypertension   [place] [facility] at [facility], [place], NP   1 year ago Essential (primary) hypertension    Primary Care at [facility], [place], NP       Future Appointments             In 3 months Rema Fendt, NP [facility] at [facility]

## 2022-10-17 ENCOUNTER — Other Ambulatory Visit: Payer: Self-pay | Admitting: Family

## 2022-10-17 DIAGNOSIS — Z1231 Encounter for screening mammogram for malignant neoplasm of breast: Secondary | ICD-10-CM

## 2022-10-18 ENCOUNTER — Telehealth: Payer: Self-pay

## 2022-10-18 NOTE — Telephone Encounter (Signed)
Message received from Curley Spice, CMA stating they received a blank Access [place] application from the patient. I tried to reach her to inform her that she does not need an appointment to complete the application. I had to leave a message requesting a call back.   I can mail her an application to complete and sign or I can complete it over the phone with her. I also need to confirm with her the disability that would qualify her for the services.

## 2022-10-20 NOTE — Telephone Encounter (Signed)
Pt is calling in returning a call from [place] regarding her [place] access application. Please follow up with pt.

## 2022-10-23 NOTE — Telephone Encounter (Signed)
Patient called back again today requesting for Erskine Squibb to give her a call back @  [phone].

## 2022-10-23 NOTE — Telephone Encounter (Signed)
I returned the call to the patient and had to leave message requesting a call back.

## 2022-10-24 NOTE — Telephone Encounter (Signed)
I spoke to the patient and completed the [employer] [place] application - Parts A and B and then sent to [employer] [place] Eligibility via [employer] email

## 2022-10-25 ENCOUNTER — Ambulatory Visit: Payer: 59 | Admitting: Family

## 2022-10-26 ENCOUNTER — Other Ambulatory Visit: Payer: Self-pay | Admitting: Family

## 2022-10-26 DIAGNOSIS — I10 Essential (primary) hypertension: Secondary | ICD-10-CM

## 2022-10-31 ENCOUNTER — Other Ambulatory Visit: Payer: Self-pay | Admitting: Family

## 2022-10-31 DIAGNOSIS — Z9229 Personal history of other drug therapy: Secondary | ICD-10-CM

## 2022-11-01 ENCOUNTER — Ambulatory Visit
Admission: RE | Admit: 2022-11-01 | Discharge: 2022-11-01 | Disposition: A | Discharge status: Home / Self Care | Payer: 59 | Source: Ambulatory Visit | Attending: Family | Admitting: Family

## 2022-11-01 DIAGNOSIS — Z1231 Encounter for screening mammogram for malignant neoplasm of breast: Secondary | ICD-10-CM

## 2022-12-10 ENCOUNTER — Other Ambulatory Visit: Payer: Self-pay | Admitting: Family

## 2022-12-10 DIAGNOSIS — I10 Essential (primary) hypertension: Secondary | ICD-10-CM

## 2022-12-14 ENCOUNTER — Other Ambulatory Visit: Payer: Self-pay | Admitting: Family

## 2022-12-14 DIAGNOSIS — E785 Hyperlipidemia, unspecified: Secondary | ICD-10-CM

## 2023-01-08 ENCOUNTER — Encounter: Payer: Self-pay | Admitting: Family

## 2023-01-08 ENCOUNTER — Ambulatory Visit (INDEPENDENT_AMBULATORY_CARE_PROVIDER_SITE_OTHER): Payer: 59 | Admitting: Family

## 2023-01-08 VITALS — BP 131/86 | HR 67 | Temp 98.7°F | Ht <= 58 in | Wt 138.2 lb

## 2023-01-08 DIAGNOSIS — E785 Hyperlipidemia, unspecified: Secondary | ICD-10-CM | POA: Diagnosis not present

## 2023-01-08 DIAGNOSIS — I10 Essential (primary) hypertension: Secondary | ICD-10-CM | POA: Diagnosis not present

## 2023-01-08 DIAGNOSIS — Z23 Encounter for immunization: Secondary | ICD-10-CM

## 2023-01-08 MED ORDER — ATORVASTATIN CALCIUM 20 MG PO TABS
20.0000 mg | ORAL_TABLET | Freq: Every day | ORAL | 0 refills | Status: DC
Start: 2023-01-08 — End: 2023-04-04

## 2023-01-08 MED ORDER — LISINOPRIL 20 MG PO TABS
20.0000 mg | ORAL_TABLET | Freq: Every day | ORAL | 0 refills | Status: DC
Start: 2023-01-08 — End: 2023-04-10

## 2023-01-08 MED ORDER — AMLODIPINE BESYLATE 5 MG PO TABS
5.0000 mg | ORAL_TABLET | Freq: Every day | ORAL | 0 refills | Status: DC
Start: 2023-01-08 — End: 2023-04-10

## 2023-01-08 NOTE — Progress Notes (Signed)
Patient states slight headaches.  Patient wants Flu vaccine.

## 2023-01-08 NOTE — Progress Notes (Signed)
Patient ID: April Randall, female    DOB: 1950-07-30  MRN: [id number]  CC: Chronic Conditions Follow-Up  Subjective: April Randall is a 72 y.o. female who presents for chronic conditions follow-up.   Her concerns today include:  - Doing well on Amlodipine and Lisinopril, no issues/concerns. She does not complain of red flag symptoms such as but not limited to chest pain, shortness of breath, worst headache of life, nausea/vomiting. Reports sometimes has mild ankle swelling throughout the day and improves with rest, no red flag symptoms. I discussed with patient in detail conservative measures and to follow-up with primary provider as scheduled. Patient verbalized understanding/agreement.  - Doing well on Atorvastatin, no issues/concerns.  - Mild intermittent headache. She denies red flag symptoms. I discussed with patient in detail to trial over-the-counter headache medication and to follow-up with primary provider as scheduled. Patient verbalized understanding/agreement.  Patient Active Problem List   Diagnosis Date Noted   Prediabetes 05/11/2021   History of right knee joint replacement 01/29/2020   Hyperlipidemia 08/22/2019   Arthritis    Essential hypertension    S/P left unicompartmental knee replacement 12/04/2011   Arthritis of knee 08/30/2011     Current Outpatient Medications on File Prior to Visit  Medication Sig Dispense Refill   aspirin EC 81 MG tablet TAKE 1 TABLET BY MOUTH EVERY DAY *SWALLOW WHOLE* 90 tablet 0   Blood Pressure Monitor DEVI Use as directed to check home blood pressure 2-3 times a week 1 each 0   cetirizine (ZYRTEC) 5 MG tablet TAKE 1 TABLET BY MOUTH EVERY DAY (Patient not taking: Reported on 01/08/2023) 90 tablet 0   No current facility-administered medications on file prior to visit.    No Known Allergies  Social History   Socioeconomic History   Marital status: Single    Spouse name: Not on file   Number of children: Not on file   Years  of education: Not on file   Highest education level: Not on file  Occupational History   Not on file  Tobacco Use   Smoking status: Never    Passive exposure: Never   Smokeless tobacco: Never  Vaping Use   Vaping status: Never Used  Substance and Sexual Activity   Alcohol use: No    Alcohol/week: 0.0 standard drinks of alcohol   Drug use: No   Sexual activity: Not Currently    Birth control/protection: Post-menopausal  Other Topics Concern   Not on file  Social History Narrative   Not on file   Social Determinants of Health   Financial Resource Strain: Low Risk  (03/14/2022)   Overall Financial Resource Strain (CARDIA)    Difficulty of Paying Living Expenses: Not hard at all  Food Insecurity: No Food Insecurity (03/14/2022)   Hunger Vital Sign    Worried About Running Out of Food in the Last Year: Never true    Ran Out of Food in the Last Year: Never true  Transportation Needs: No Transportation Needs (03/14/2022)   PRAPARE - Administrator, Civil Service (Medical): No    Lack of Transportation (Non-Medical): No  Physical Activity: Inactive (03/14/2022)   Exercise Vital Sign    Days of Exercise per Week: 0 days    Minutes of Exercise per Session: 0 min  Stress: No Stress Concern Present (03/14/2022)   [employer] of Occupational Health - Occupational Stress Questionnaire    Feeling of Stress : Not at all  Social Connections: Unknown (03/15/2022)  [facility]   Social Network    Social Network: Not on file  Intimate Partner Violence: Unknown (03/15/2022)   [facility] from [facility]   HITS    Physically Hurt: Not on file    Insult or Talk Down To: Not on file    Threaten Physical Harm: Not on file    Scream or Curse: Not on file    Family History  Problem Relation Age of Onset   Hypertension Mother    Breast cancer Neg Hx    Colon cancer Neg Hx    Colon polyps Neg Hx    Esophageal cancer Neg Hx     Rectal cancer Neg Hx    Stomach cancer Neg Hx     Past Surgical History:  Procedure Laterality Date   COLONOSCOPY  05/29/2022   JOINT REPLACEMENT N/A    Phreesia 08/20/2019   REPLACEMENT TOTAL KNEE Right     ROS: Review of Systems Negative except as stated above  PHYSICAL EXAM: BP 131/86   Pulse 67   Temp 98.7 F (37.1 C) (Oral)   Ht 4\' 9"  (1.448 m)   Wt 138 lb 3.2 oz (62.7 kg)   SpO2 96%   BMI 29.91 kg/m   Physical Exam HENT:     Head: Normocephalic and atraumatic.     Nose: Nose normal.     Mouth/Throat:     Mouth: Mucous membranes are moist.     Pharynx: Oropharynx is clear.  Eyes:     Extraocular Movements: Extraocular movements intact.     Conjunctiva/sclera: Conjunctivae normal.     Pupils: Pupils are equal, round, and reactive to light.  Cardiovascular:     Rate and Rhythm: Normal rate and regular rhythm.     Pulses: Normal pulses.     Heart sounds: Normal heart sounds.  Pulmonary:     Effort: Pulmonary effort is normal.     Breath sounds: Normal breath sounds.  Musculoskeletal:        General: Normal range of motion.     Right shoulder: Normal.     Left shoulder: Normal.     Right upper arm: Normal.     Left upper arm: Normal.     Right elbow: Normal.     Left elbow: Normal.     Right forearm: Normal.     Left forearm: Normal.     Right wrist: Normal.     Left wrist: Normal.     Right hand: Normal.     Left hand: Normal.     Cervical back: Normal, normal range of motion and neck supple.     Thoracic back: Normal.     Lumbar back: Normal.     Right hip: Normal.     Left hip: Normal.     Right upper leg: Normal.     Left upper leg: Normal.     Right knee: Normal.     Left knee: Normal.     Right lower leg: Normal.     Left lower leg: Normal.     Right ankle: Normal.     Left ankle: Normal.     Right foot: Normal.     Left foot: Normal.  Neurological:     General: No focal deficit present.     Mental Status: She is alert and  oriented to person, place, and time.  Psychiatric:        Mood and Affect: Mood normal.  Behavior: Behavior normal.       ASSESSMENT AND PLAN: 1. Primary hypertension - Continue Amlodipine and Lisinopril as prescribed.  - Routine screening.  - Counseled on blood pressure goal of less than 140/90, low-sodium, DASH diet, medication compliance, 150 minutes of moderate intensity exercise per week as tolerated. Discussed medication compliance, adverse effects. - Follow-up with primary provider in 3 months or sooner if needed.  - amLODipine (NORVASC) 5 MG tablet; Take 1 tablet (5 mg total) by mouth daily.  Dispense: 90 tablet; Refill: 0 - lisinopril (ZESTRIL) 20 MG tablet; Take 1 tablet (20 mg total) by mouth daily.  Dispense: 90 tablet; Refill: 0 - Basic Metabolic Panel  2. Hyperlipidemia, unspecified hyperlipidemia type - Continue Atorvastatin as prescribed. Counseled on medication adherence/adverse effects.  - Routine screening.  - Follow-up with primary provider as scheduled.  - atorvastatin (LIPITOR) 20 MG tablet; Take 1 tablet (20 mg total) by mouth daily.  Dispense: 90 tablet; Refill: 0 - Lipid panel    Patient was given the opportunity to ask questions.  Patient verbalized understanding of the plan and was able to repeat key elements of the plan. Patient was given clear instructions to go to Emergency Department or return to medical center if symptoms don't improve, worsen, or new problems develop.The patient verbalized understanding.   Orders Placed This Encounter  Procedures   Basic Metabolic Panel   Lipid panel     Requested Prescriptions   Signed Prescriptions Disp Refills   amLODipine (NORVASC) 5 MG tablet 90 tablet 0    Sig: Take 1 tablet (5 mg total) by mouth daily.   atorvastatin (LIPITOR) 20 MG tablet 90 tablet 0    Sig: Take 1 tablet (20 mg total) by mouth daily.   lisinopril (ZESTRIL) 20 MG tablet 90 tablet 0    Sig: Take 1 tablet (20 mg total) by  mouth daily.    Return in about 3 months (around 04/10/2023) for Follow-Up or next available chronic conditions.  Rema Fendt, NP

## 2023-01-09 LAB — LIPID PANEL
Chol/HDL Ratio: 2.1 {ratio} (ref 0.0–4.4)
Cholesterol, Total: 135 mg/dL (ref 100–199)
HDL: 64 mg/dL (ref >39)
LDL Chol Calc (NIH): 61 mg/dL (ref 0–99)
Triglycerides: 44 mg/dL (ref 0–149)
VLDL Cholesterol Cal: 10 mg/dL (ref 5–40)

## 2023-01-09 LAB — BASIC METABOLIC PANEL
BUN/Creatinine Ratio: 12 (ref 12–28)
BUN: 12 mg/dL (ref 8–27)
CO2: 24 mmol/L (ref 20–29)
Calcium: 10.8 mg/dL — ABNORMAL HIGH (ref 8.7–10.3)
Chloride: 107 mmol/L — ABNORMAL HIGH (ref 96–106)
Creatinine, Ser: 0.99 mg/dL (ref 0.57–1.00)
Glucose: 98 mg/dL (ref 70–99)
Potassium: 3.9 mmol/L (ref 3.5–5.2)
Sodium: 143 mmol/L (ref 134–144)
eGFR: 61 mL/min/{1.73_m2} (ref >59)

## 2023-01-31 ENCOUNTER — Other Ambulatory Visit: Payer: Self-pay

## 2023-01-31 ENCOUNTER — Other Ambulatory Visit: Payer: Self-pay | Admitting: Family

## 2023-01-31 DIAGNOSIS — Z9229 Personal history of other drug therapy: Secondary | ICD-10-CM

## 2023-01-31 MED ORDER — ASPIRIN 81 MG PO TBEC
81.0000 mg | DELAYED_RELEASE_TABLET | Freq: Four times a day (QID) | ORAL | 0 refills | Status: DC | PRN
Start: 2023-01-31 — End: 2023-07-30

## 2023-02-18 IMAGING — MG MM DIGITAL SCREENING BILAT W/ TOMO AND CAD
8 series · 8 of 24 positions shown · non-contrast
Comparison: Previous exam(s).

CLINICAL DATA: Screening.

EXAM:
DIGITAL SCREENING BILATERAL MAMMOGRAM WITH TOMOSYNTHESIS AND CAD
TECHNIQUE: Bilateral screening digital craniocaudal and mediolateral oblique
mammograms were obtained. Bilateral screening digital breast
tomosynthesis was performed. The images were evaluated with
computer-aided detection.

[R CC synth-2D]
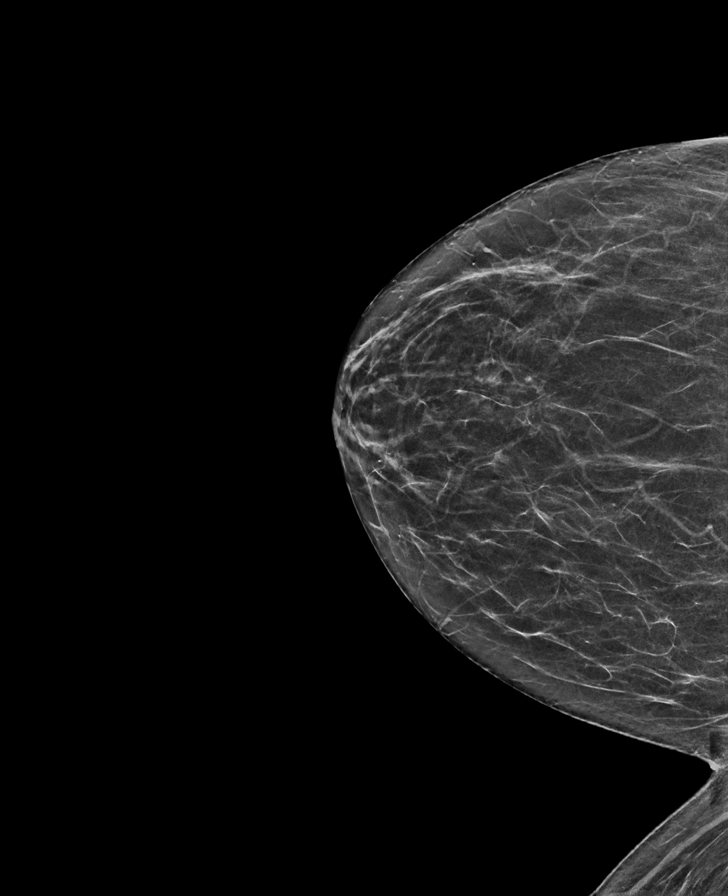

[L MLO synth-2D]
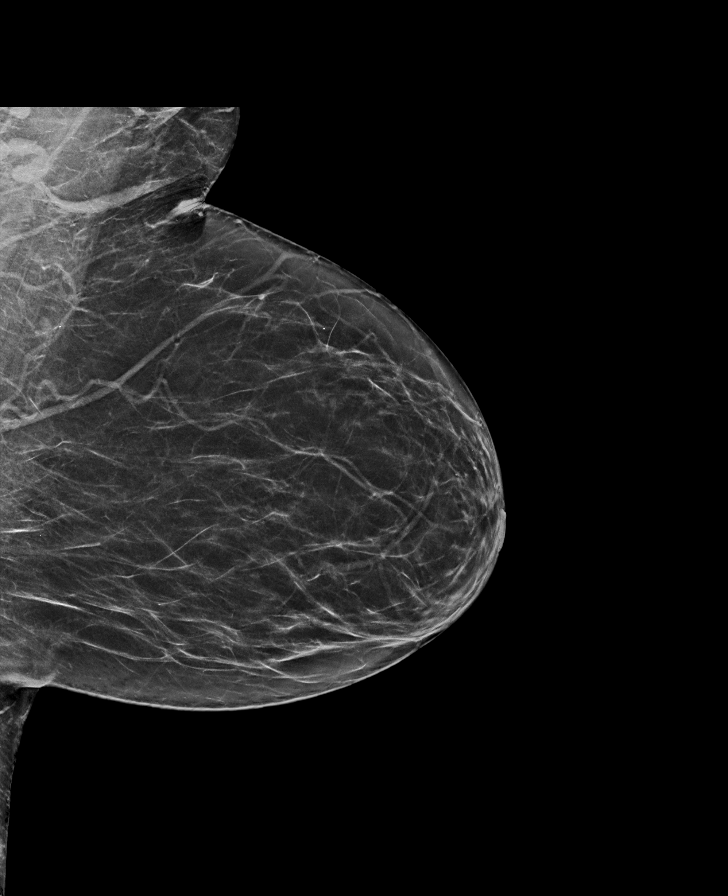

[L CC synth-2D]
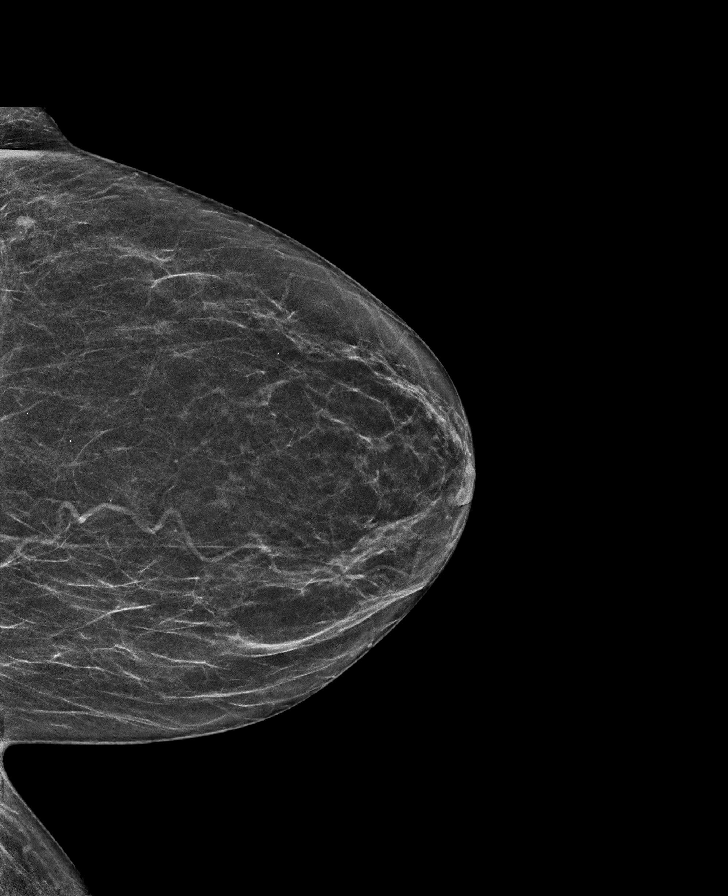

[R MLO synth-2D]
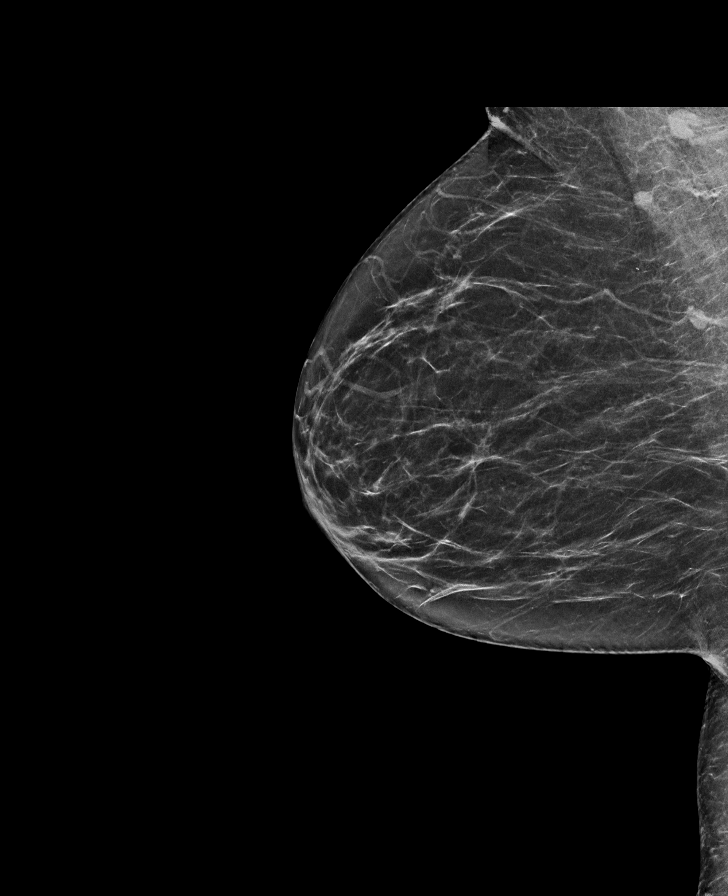

[L CC tomo · tomo slice 29/58.0]
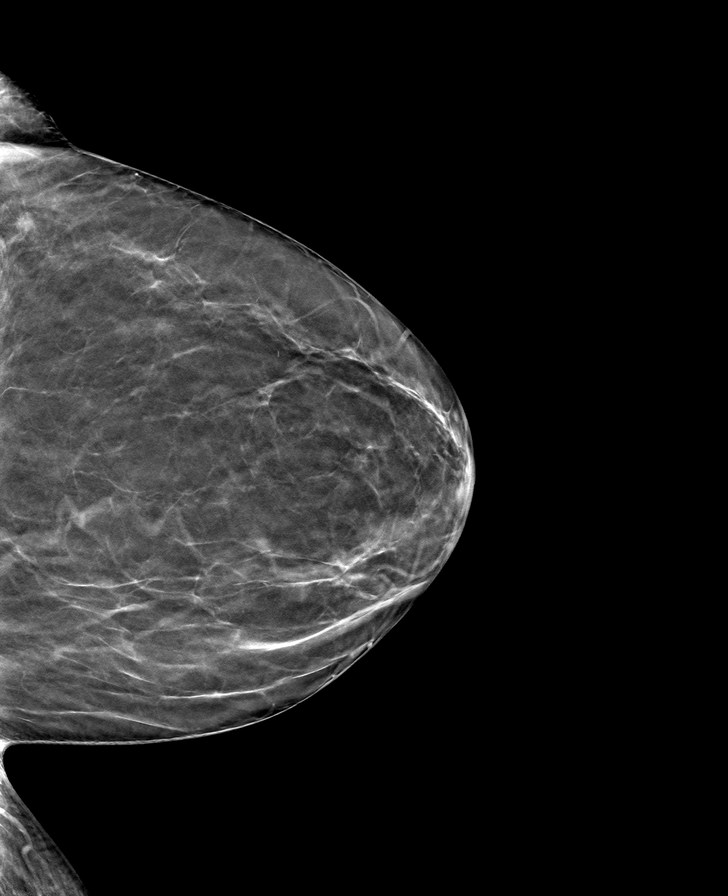

[L MLO tomo · tomo slice 32/63.0]
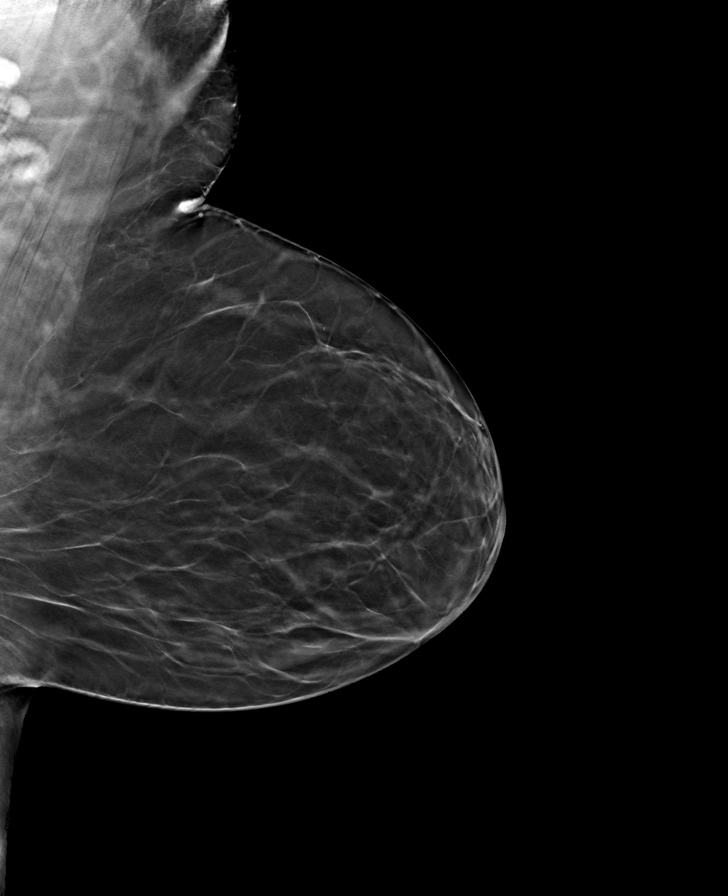

[R MLO tomo · tomo slice 31/60.0]
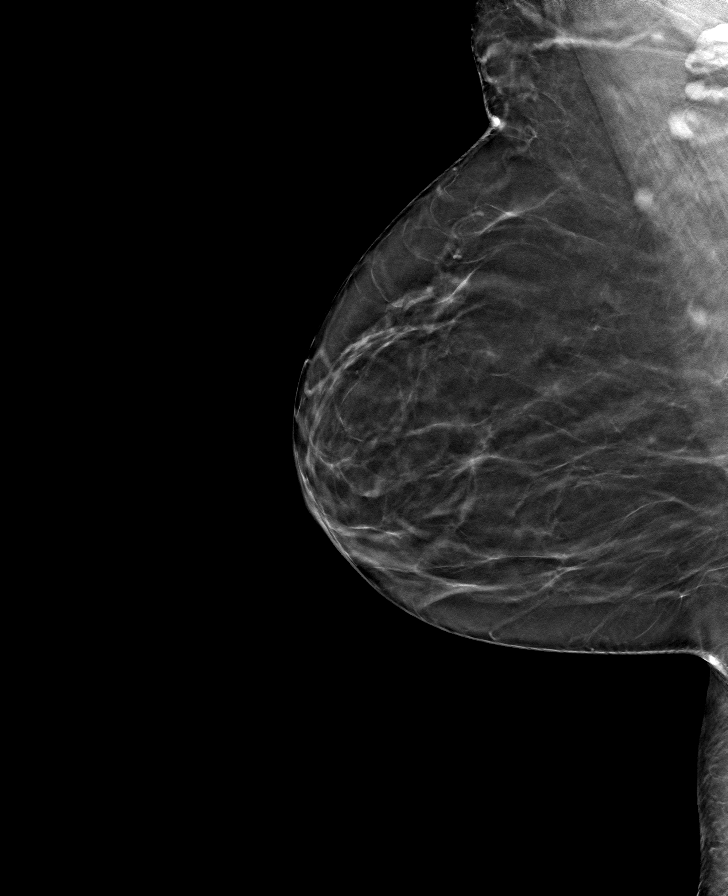

[R CC tomo · tomo slice 28/55.0]
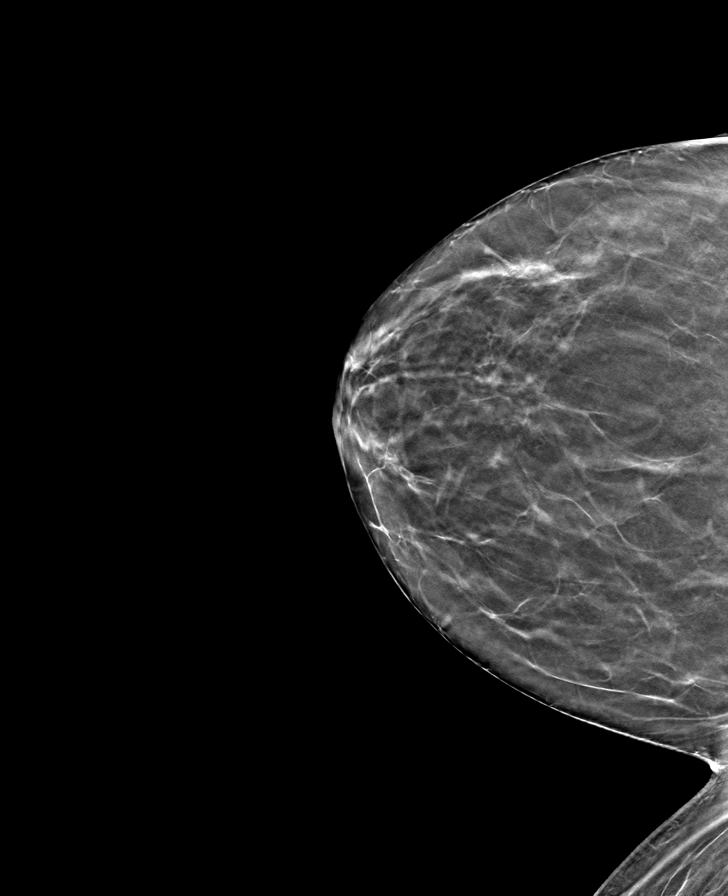

[8 of 24 positions shown; findings below may reference images not displayed]

ACR Breast Density Category b: There are scattered areas of
fibroglandular density.
FINDINGS: There are no findings suspicious for malignancy. The images were
evaluated with computer-aided detection.
IMPRESSION: No mammographic evidence of malignancy. A result letter of this
screening mammogram will be mailed directly to the patient.

RECOMMENDATION:
Screening mammogram in one year. (Code:WJ-I-BG6)

BI-RADS CATEGORY  1: Negative.

## 2023-04-04 ENCOUNTER — Ambulatory Visit: Payer: Self-pay

## 2023-04-04 ENCOUNTER — Other Ambulatory Visit: Payer: Self-pay

## 2023-04-04 DIAGNOSIS — E785 Hyperlipidemia, unspecified: Secondary | ICD-10-CM

## 2023-04-04 MED ORDER — ATORVASTATIN CALCIUM 20 MG PO TABS
20.0000 mg | ORAL_TABLET | Freq: Every day | ORAL | 2 refills | Status: DC
Start: 2023-04-04 — End: 2023-04-10

## 2023-04-04 NOTE — Telephone Encounter (Signed)
 Message from [place] sent at 04/04/2023  3:23 PM EST  Summary: Rx - still needed?   Pt inquiring if she needs to continue the atorvastatin  (LIPITOR) 20 MG tablet. Pt was informed her cholesterol levels are stable from labs done in October 2024.  Pt seeking clinical advice         Chief Complaint: pt wanting to know if she is to continue Atorvastatin  since her levels are stable. According to result notes form previous Lipid panel, plan to continue on current medication. Pt advised of this. Needs refill of Atorvastatin .  Symptoms: n/a Frequency: n/a Pertinent Negatives: Patient denies n/a Disposition: [] ED /[] Urgent Care (no appt availability in office) / [] Appointment(In office/virtual)/ []  [facility]/ [x] Home Care/ [] Refused Recommended Disposition /[] [place] [facility]/ []  Follow-up with PCP Additional Notes: sent refill request to [facility] triage pool. Advised pt of 48 - 72 hour turn around time. Pt verbalized understanding.   Reason for Disposition  [1] Caller requesting a prescription renewal (no refills left), no triage required, AND [2] triager able to renew prescription per department policy  Answer Assessment - Initial Assessment Questions 1. DRUG NAME: What medicine do you need to have refilled?     Atorvastatin  2. REFILLS REMAINING: How many refills are remaining? (Note: The label on the medicine or pill bottle will show how many refills are remaining. If there are no refills remaining, then a renewal may be needed.)     zero  Protocols used: Medication Refill and Renewal Call-A-AH

## 2023-04-04 NOTE — Telephone Encounter (Signed)
 Patient called, left VM to return the call to the office to speak to the NT.    Summary: Rx - still needed?   Pt inquiring if she needs to continue the atorvastatin  (LIPITOR) 20 MG tablet. Pt was informed her cholesterol levels are stable from labs done in October 2024.  Pt seeking clinical advice

## 2023-04-10 ENCOUNTER — Ambulatory Visit (INDEPENDENT_AMBULATORY_CARE_PROVIDER_SITE_OTHER): Payer: 59 | Admitting: Family

## 2023-04-10 VITALS — BP 135/81 | HR 68 | Temp 97.4°F | Ht <= 58 in | Wt 141.0 lb

## 2023-04-10 DIAGNOSIS — E785 Hyperlipidemia, unspecified: Secondary | ICD-10-CM | POA: Diagnosis not present

## 2023-04-10 DIAGNOSIS — I10 Essential (primary) hypertension: Secondary | ICD-10-CM | POA: Diagnosis not present

## 2023-04-10 MED ORDER — ATORVASTATIN CALCIUM 20 MG PO TABS
20.0000 mg | ORAL_TABLET | Freq: Every day | ORAL | 0 refills | Status: DC
Start: 2023-04-10 — End: 2023-06-28

## 2023-04-10 MED ORDER — LISINOPRIL 20 MG PO TABS
20.0000 mg | ORAL_TABLET | Freq: Every day | ORAL | 0 refills | Status: DC
Start: 2023-04-10 — End: 2023-07-09

## 2023-04-10 MED ORDER — AMLODIPINE BESYLATE 5 MG PO TABS
5.0000 mg | ORAL_TABLET | Freq: Every day | ORAL | 0 refills | Status: DC
Start: 2023-04-10 — End: 2023-07-09

## 2023-04-10 NOTE — Progress Notes (Signed)
 Patient ID: April Randall, female    DOB: October 14, 1950  MRN: [id number]  CC: Chronic Conditions Follow-Up  Subjective: April Randall is a 73 y.o. female who presents for chronic conditions follow-up.   Her concerns today include:  - Doing well on Amlodipine  and Lisinopril , no issues/concerns. She does not complain of red flag symptoms such as but not limited to chest pain, shortness of breath, worst headache of life, nausea/vomiting.  - Doing well on Atorvastatin , no issues/concerns.    Patient Active Problem List   Diagnosis Date Noted   Prediabetes 05/11/2021   History of right knee joint replacement 01/29/2020   Hyperlipidemia 08/22/2019   Arthritis    Essential hypertension    S/P left unicompartmental knee replacement 12/04/2011   Arthritis of knee 08/30/2011     Current Outpatient Medications on File Prior to Visit  Medication Sig Dispense Refill   aspirin  EC 81 MG tablet Take 1 tablet (81 mg total) by mouth every 6 (six) hours as needed. Swallow whole. 90 tablet 0   ASPIRIN  LOW DOSE 81 MG tablet TAKE 1 TABLET BY MOUTH EVERY DAY *SWALLOW WHOLE* 90 tablet 0   Blood Pressure Monitor DEVI Use as directed to check home blood pressure 2-3 times a week 1 each 0   cetirizine  (ZYRTEC ) 5 MG tablet TAKE 1 TABLET BY MOUTH EVERY DAY (Patient not taking: Reported on 04/10/2023) 90 tablet 0   No current facility-administered medications on file prior to visit.    No Known Allergies  Social History   Socioeconomic History   Marital status: Single    Spouse name: Not on file   Number of children: Not on file   Years of education: Not on file   Highest education level: Not on file  Occupational History   Not on file  Tobacco Use   Smoking status: Never    Passive exposure: Never   Smokeless tobacco: Never  Vaping Use   Vaping status: Never Used  Substance and Sexual Activity   Alcohol use: No    Alcohol/week: 0.0 standard drinks of alcohol   Drug use: No   Sexual  activity: Not Currently    Birth control/protection: Post-menopausal  Other Topics Concern   Not on file  Social History Narrative   Not on file   Social Drivers of Health   Financial Resource Strain: Low Risk  (03/14/2022)   Overall Financial Resource Strain (CARDIA)    Difficulty of Paying Living Expenses: Not hard at all  Food Insecurity: No Food Insecurity (03/14/2022)   Hunger Vital Sign    Worried About Running Out of Food in the Last Year: Never true    Ran Out of Food in the Last Year: Never true  Transportation Needs: No Transportation Needs (03/14/2022)   PRAPARE - Administrator, Civil Service (Medical): No    Lack of Transportation (Non-Medical): No  Physical Activity: Inactive (03/14/2022)   Exercise Vital Sign    Days of Exercise per Week: 0 days    Minutes of Exercise per Session: 0 min  Stress: No Stress Concern Present (03/14/2022)   [employer] of Occupational [employer] - Occupational Stress Questionnaire    Feeling of Stress : Not at all  Social Connections: Unknown (03/15/2022)   Received from [facility], [facility]   Social Network    Social Network: Not on file  Intimate Partner Violence: Unknown (03/15/2022)   Received from [facility], [facility]   HITS    Physically  Hurt: Not on file    Insult or Talk Down To: Not on file    Threaten Physical Harm: Not on file    Scream or Curse: Not on file    Family History  Problem Relation Age of Onset   Hypertension Mother    Breast cancer Neg Hx    Colon cancer Neg Hx    Colon polyps Neg Hx    Esophageal cancer Neg Hx    Rectal cancer Neg Hx    Stomach cancer Neg Hx     Past Surgical History:  Procedure Laterality Date   COLONOSCOPY  05/29/2022   JOINT REPLACEMENT N/A    Phreesia 08/20/2019   REPLACEMENT TOTAL KNEE Right     ROS: Review of Systems Negative except as stated above  PHYSICAL EXAM: BP 135/81   Pulse 68   Temp (!) 97.4 F (36.3 C) (Oral)   Ht 4' 9  (1.448 m)   Wt 141 lb (64 kg)   SpO2 97%   BMI 30.51 kg/m   Physical Exam HENT:     Head: Normocephalic and atraumatic.     Nose: Nose normal.     Mouth/Throat:     Mouth: Mucous membranes are moist.     Pharynx: Oropharynx is clear.  Eyes:     Extraocular Movements: Extraocular movements intact.     Conjunctiva/sclera: Conjunctivae normal.     Pupils: Pupils are equal, round, and reactive to light.  Cardiovascular:     Rate and Rhythm: Normal rate and regular rhythm.     Pulses: Normal pulses.     Heart sounds: Normal heart sounds.  Pulmonary:     Effort: Pulmonary effort is normal.     Breath sounds: Normal breath sounds.  Musculoskeletal:        General: Normal range of motion.     Cervical back: Normal range of motion and neck supple.  Neurological:     General: No focal deficit present.     Mental Status: She is alert and oriented to person, place, and time.  Psychiatric:        Mood and Affect: Mood normal.        Behavior: Behavior normal.     ASSESSMENT AND PLAN: 1. Primary hypertension (Primary) - Continue Amlodipine  and Lisinopril  as prescribed.  - Counseled on blood pressure goal of less than 140/90, low-sodium, DASH diet, medication compliance, 150 minutes of moderate intensity exercise per week as tolerated. Discussed medication compliance, adverse effects. - Follow-up with primary provider in 3 months or sooner if needed.  - amLODipine  (NORVASC ) 5 MG tablet; Take 1 tablet (5 mg total) by mouth daily.  Dispense: 90 tablet; Refill: 0 - lisinopril  (ZESTRIL ) 20 MG tablet; Take 1 tablet (20 mg total) by mouth daily.  Dispense: 90 tablet; Refill: 0  2. Hyperlipidemia, unspecified hyperlipidemia type - Continue Atorvastatin  as prescribed. Counseled on  medication adherence/adverse effects.  - Follow-up with primary provider in 3 months or sooner if needed.  - atorvastatin  (LIPITOR) 20 MG tablet; Take 1 tablet (20 mg total) by mouth daily.  Dispense: 90 tablet;  Refill: 0    Patient was given the opportunity to ask questions.  Patient verbalized understanding of the plan and was able to repeat key elements of the plan. Patient was given clear instructions to go to Emergency Department or return to medical center if symptoms don't improve, worsen, or new problems develop.The patient verbalized understanding.   Requested Prescriptions   Signed Prescriptions Disp Refills  amLODipine  (NORVASC ) 5 MG tablet 90 tablet 0    Sig: Take 1 tablet (5 mg total) by mouth daily.   atorvastatin  (LIPITOR) 20 MG tablet 90 tablet 0    Sig: Take 1 tablet (20 mg total) by mouth daily.   lisinopril  (ZESTRIL ) 20 MG tablet 90 tablet 0    Sig: Take 1 tablet (20 mg total) by mouth daily.    Return in about 3 months (around 07/09/2023) for Follow-Up or next available chronic conditions.  Greig JINNY Drones, NP

## 2023-04-10 NOTE — Progress Notes (Signed)
 Patient wants to talk about cholesterol.

## 2023-04-23 ENCOUNTER — Other Ambulatory Visit: Payer: Self-pay

## 2023-04-23 ENCOUNTER — Emergency Department (HOSPITAL_BASED_OUTPATIENT_CLINIC_OR_DEPARTMENT_OTHER)
Admission: EM | Admit: 2023-04-23 | Discharge: 2023-04-23 | Discharge status: Against Medical Advice | Payer: 59 | Attending: Emergency Medicine | Admitting: Emergency Medicine

## 2023-04-23 ENCOUNTER — Encounter (HOSPITAL_BASED_OUTPATIENT_CLINIC_OR_DEPARTMENT_OTHER): Payer: Self-pay | Admitting: Emergency Medicine

## 2023-04-23 DIAGNOSIS — R42 Dizziness and giddiness: Secondary | ICD-10-CM | POA: Insufficient documentation

## 2023-04-23 DIAGNOSIS — I10 Essential (primary) hypertension: Secondary | ICD-10-CM | POA: Diagnosis not present

## 2023-04-23 DIAGNOSIS — M542 Cervicalgia: Secondary | ICD-10-CM | POA: Insufficient documentation

## 2023-04-23 DIAGNOSIS — Z5321 Procedure and treatment not carried out due to patient leaving prior to being seen by health care provider: Secondary | ICD-10-CM | POA: Insufficient documentation

## 2023-04-23 NOTE — ED Notes (Signed)
Patient denies CP and SHOB.

## 2023-04-23 NOTE — ED Notes (Signed)
Pt called x2 for room assignment with no answer

## 2023-04-23 NOTE — ED Triage Notes (Signed)
C/o elevated BP and dizziness. Denies missing any HTN meds recently.  Also endorses neck pain.

## 2023-04-24 ENCOUNTER — Ambulatory Visit (INDEPENDENT_AMBULATORY_CARE_PROVIDER_SITE_OTHER): Payer: 59 | Admitting: Family

## 2023-04-24 ENCOUNTER — Other Ambulatory Visit (HOSPITAL_COMMUNITY)
Admission: RE | Admit: 2023-04-24 | Discharge: 2023-04-24 | Disposition: A | Discharge status: Home / Self Care | Payer: 59 | Source: Ambulatory Visit | Attending: Family | Admitting: Family

## 2023-04-24 VITALS — BP 136/87 | HR 92 | Temp 98.1°F | Ht <= 58 in | Wt 140.4 lb

## 2023-04-24 DIAGNOSIS — R42 Dizziness and giddiness: Secondary | ICD-10-CM | POA: Insufficient documentation

## 2023-04-24 LAB — POCT URINALYSIS DIP (CLINITEK)
Bilirubin, UA: NEGATIVE
Glucose, UA: NEGATIVE mg/dL
Ketones, POC UA: NEGATIVE mg/dL
Leukocytes, UA: NEGATIVE
Nitrite, UA: NEGATIVE
POC PROTEIN,UA: NEGATIVE
Spec Grav, UA: 1.01 (ref 1.010–1.025)
Urobilinogen, UA: 0.2 U/dL
pH, UA: 7 (ref 5.0–8.0)

## 2023-04-24 NOTE — Progress Notes (Signed)
Patient ID: April Randall, female    DOB: January 12, 1951  MRN: [id number]  CC: Dizziness   Subjective: April Randall is a 73 y.o. female who presents for dizziness.  Her concerns today include:  - Intermittent dizziness. Denies red flag symptoms. Established with ENT in the past and has not followed up recently. Blood pressure normal today in office.   Patient Active Problem List   Diagnosis Date Noted   Prediabetes 05/11/2021   History of right knee joint replacement 01/29/2020   Hyperlipidemia 08/22/2019   Arthritis    Essential hypertension    S/P left unicompartmental knee replacement 12/04/2011   Arthritis of knee 08/30/2011     Current Outpatient Medications on File Prior to Visit  Medication Sig Dispense Refill   amLODipine (NORVASC) 5 MG tablet Take 1 tablet (5 mg total) by mouth daily. 90 tablet 0   aspirin EC 81 MG tablet Take 1 tablet (81 mg total) by mouth every 6 (six) hours as needed. Swallow whole. 90 tablet 0   atorvastatin (LIPITOR) 20 MG tablet Take 1 tablet (20 mg total) by mouth daily. 90 tablet 0   Blood Pressure Monitor DEVI Use as directed to check home blood pressure 2-3 times a week 1 each 0   lisinopril (ZESTRIL) 20 MG tablet Take 1 tablet (20 mg total) by mouth daily. 90 tablet 0   ASPIRIN LOW DOSE 81 MG tablet TAKE 1 TABLET BY MOUTH EVERY DAY *SWALLOW WHOLE* (Patient not taking: Reported on 04/24/2023) 90 tablet 0   cetirizine (ZYRTEC) 5 MG tablet TAKE 1 TABLET BY MOUTH EVERY DAY (Patient not taking: Reported on 01/08/2023) 90 tablet 0   No current facility-administered medications on file prior to visit.    No Known Allergies  Social History   Socioeconomic History   Marital status: Single    Spouse name: Not on file   Number of children: Not on file   Years of education: Not on file   Highest education level: Not on file  Occupational History   Not on file  Tobacco Use   Smoking status: Never    Passive exposure: Never   Smokeless  tobacco: Never  Vaping Use   Vaping status: Never Used  Substance and Sexual Activity   Alcohol use: No    Alcohol/week: 0.0 standard drinks of alcohol   Drug use: No   Sexual activity: Not Currently    Birth control/protection: Post-menopausal  Other Topics Concern   Not on file  Social History Narrative   Not on file   Social Drivers of Health   Financial Resource Strain: Low Risk  (03/14/2022)   Overall Financial Resource Strain (CARDIA)    Difficulty of Paying Living Expenses: Not hard at all  Food Insecurity: No Food Insecurity (03/14/2022)   Hunger Vital Sign    Worried About Running Out of Food in the Last Year: Never true    Ran Out of Food in the Last Year: Never true  Transportation Needs: No Transportation Needs (03/14/2022)   PRAPARE - Administrator, Civil Service (Medical): No    Lack of Transportation (Non-Medical): No  Physical Activity: Inactive (03/14/2022)   Exercise Vital Sign    Days of Exercise per Week: 0 days    Minutes of Exercise per Session: 0 min  Stress: No Stress Concern Present (03/14/2022)   [employer] of Occupational Health - Occupational Stress Questionnaire    Feeling of Stress : Not at all  Social Connections: Unknown (  03/15/2022)   Received from [facility], [facility]   Social Network    Social Network: Not on file  Intimate Partner Violence: Unknown (03/15/2022)   Received from [facility], [facility]   HITS    Physically Hurt: Not on file    Insult or Talk Down To: Not on file    Threaten Physical Harm: Not on file    Scream or Curse: Not on file    Family History  Problem Relation Age of Onset   Hypertension Mother    Breast cancer Neg Hx    Colon cancer Neg Hx    Colon polyps Neg Hx    Esophageal cancer Neg Hx    Rectal cancer Neg Hx    Stomach cancer Neg Hx     Past Surgical History:  Procedure Laterality Date   COLONOSCOPY  05/29/2022   JOINT REPLACEMENT N/A    Phreesia 08/20/2019    REPLACEMENT TOTAL KNEE Right     ROS: Review of Systems Negative except as stated above  PHYSICAL EXAM: BP 136/87   Pulse 92   Temp 98.1 F (36.7 C) (Oral)   Ht 4\' 9"  (1.448 m)   Wt 140 lb 6.4 oz (63.7 kg)   SpO2 97%   BMI 30.38 kg/m   Physical Exam HENT:     Head: Normocephalic and atraumatic.     Nose: Nose normal.     Mouth/Throat:     Mouth: Mucous membranes are moist.     Pharynx: Oropharynx is clear.  Eyes:     Extraocular Movements: Extraocular movements intact.     Conjunctiva/sclera: Conjunctivae normal.     Pupils: Pupils are equal, round, and reactive to light.  Cardiovascular:     Rate and Rhythm: Normal rate and regular rhythm.     Pulses: Normal pulses.     Heart sounds: Normal heart sounds.  Pulmonary:     Effort: Pulmonary effort is normal.     Breath sounds: Normal breath sounds.  Musculoskeletal:        General: Normal range of motion.     Cervical back: Normal range of motion and neck supple.  Neurological:     General: No focal deficit present.     Mental Status: She is alert and oriented to person, place, and time.  Psychiatric:        Mood and Affect: Mood normal.        Behavior: Behavior normal.     ASSESSMENT AND PLAN: 1. Vertigo (Primary) - Routine screening.  - Referral to ENT for evaluation/management.  - Follow-up with primary provider as scheduled.  - TSH - CBC - Basic Metabolic Panel - POCT URINALYSIS DIP (CLINITEK); Future - Hemoglobin A1c - Ambulatory referral to ENT - Cervicovaginal ancillary only    Patient was given the opportunity to ask questions.  Patient verbalized understanding of the plan and was able to repeat key elements of the plan. Patient was given clear instructions to go to Emergency Department or return to medical center if symptoms don't improve, worsen, or new problems develop.The patient verbalized understanding.   Orders Placed This Encounter  Procedures   TSH   CBC   Basic Metabolic Panel    Hemoglobin A1c   Ambulatory referral to ENT   POCT URINALYSIS DIP (CLINITEK)    Follow-up with primary provider as scheduled.   Rema Fendt, NP

## 2023-04-24 NOTE — Progress Notes (Signed)
Patient states she started feeling dizzy yesterday.\   No other concerns to discuss.

## 2023-04-25 ENCOUNTER — Encounter: Payer: Self-pay | Admitting: Family

## 2023-04-25 LAB — CERVICOVAGINAL ANCILLARY ONLY
Bacterial Vaginitis (gardnerella): NEGATIVE
Candida Glabrata: NEGATIVE
Candida Vaginitis: NEGATIVE
Chlamydia: NEGATIVE
Comment: NEGATIVE
Comment: NEGATIVE
Comment: NEGATIVE
Comment: NEGATIVE
Comment: NEGATIVE
Comment: NORMAL
Neisseria Gonorrhea: NEGATIVE
Trichomonas: NEGATIVE

## 2023-04-25 LAB — BASIC METABOLIC PANEL
BUN/Creatinine Ratio: 12 (ref 12–28)
BUN: 11 mg/dL (ref 8–27)
CO2: 21 mmol/L (ref 20–29)
Calcium: 11.1 mg/dL — ABNORMAL HIGH (ref 8.7–10.3)
Chloride: 106 mmol/L (ref 96–106)
Creatinine, Ser: 0.92 mg/dL (ref 0.57–1.00)
Glucose: 108 mg/dL — ABNORMAL HIGH (ref 70–99)
Potassium: 3.6 mmol/L (ref 3.5–5.2)
Sodium: 141 mmol/L (ref 134–144)
eGFR: 66 mL/min/{1.73_m2} (ref >59)

## 2023-04-25 LAB — HEMOGLOBIN A1C
Est. average glucose Bld gHb Est-mCnc: 128 mg/dL
Hgb A1c MFr Bld: 6.1 % — ABNORMAL HIGH (ref 4.8–5.6)

## 2023-04-25 LAB — CBC
Hematocrit: 42.9 % (ref 34.0–46.6)
Hemoglobin: 14.1 g/dL (ref 11.1–15.9)
MCH: 30.7 pg (ref 26.6–33.0)
MCHC: 32.9 g/dL (ref 31.5–35.7)
MCV: 94 fL (ref 79–97)
Platelets: 194 10*3/uL (ref 150–450)
RBC: 4.59 x10E6/uL (ref 3.77–5.28)
RDW: 11.9 % (ref 11.7–15.4)
WBC: 3.8 10*3/uL (ref 3.4–10.8)

## 2023-04-25 LAB — TSH: TSH: 3.51 u[IU]/mL (ref 0.450–4.500)

## 2023-06-22 ENCOUNTER — Ambulatory Visit (INDEPENDENT_AMBULATORY_CARE_PROVIDER_SITE_OTHER): Admitting: Family

## 2023-06-22 VITALS — BP 124/79 | HR 87 | Temp 97.8°F | Ht <= 58 in | Wt 145.8 lb

## 2023-06-22 DIAGNOSIS — Z0120 Encounter for dental examination and cleaning without abnormal findings: Secondary | ICD-10-CM | POA: Diagnosis not present

## 2023-06-22 MED ORDER — AMOXICILLIN 500 MG PO CAPS
1000.0000 mg | ORAL_CAPSULE | Freq: Once | ORAL | 0 refills | Status: AC
Start: 2023-06-22 — End: 2023-06-22

## 2023-06-22 NOTE — Progress Notes (Signed)
 Patient ID: April Randall, female    DOB: December 07, 1950  MRN: [id number]  CC: Medication Refill   Subjective: April Randall is a 73 y.o. female who presents for medication refill.   Her concerns today include:  States she will be attending a free dental event on tomorrow. States in the past she would take an antibiotic prior to dental work. No further issues/concerns for discussion today.   Patient Active Problem List   Diagnosis Date Noted   Prediabetes 05/11/2021   History of right knee joint replacement 01/29/2020   Hyperlipidemia 08/22/2019   Arthritis    Essential hypertension    S/P left unicompartmental knee replacement 12/04/2011   Arthritis of knee 08/30/2011     Current Outpatient Medications on File Prior to Visit  Medication Sig Dispense Refill   amLODipine (NORVASC) 5 MG tablet Take 1 tablet (5 mg total) by mouth daily. 90 tablet 0   aspirin EC 81 MG tablet Take 1 tablet (81 mg total) by mouth every 6 (six) hours as needed. Swallow whole. 90 tablet 0   atorvastatin (LIPITOR) 20 MG tablet Take 1 tablet (20 mg total) by mouth daily. 90 tablet 0   Blood Pressure Monitor DEVI Use as directed to check home blood pressure 2-3 times a week 1 each 0   lisinopril (ZESTRIL) 20 MG tablet Take 1 tablet (20 mg total) by mouth daily. 90 tablet 0   ASPIRIN LOW DOSE 81 MG tablet TAKE 1 TABLET BY MOUTH EVERY DAY *SWALLOW WHOLE* (Patient not taking: Reported on 06/22/2023) 90 tablet 0   cetirizine (ZYRTEC) 5 MG tablet TAKE 1 TABLET BY MOUTH EVERY DAY (Patient not taking: Reported on 06/22/2023) 90 tablet 0   No current facility-administered medications on file prior to visit.    No Known Allergies  Social History   Socioeconomic History   Marital status: Single    Spouse name: Not on file   Number of children: Not on file   Years of education: Not on file   Highest education level: Not on file  Occupational History   Not on file  Tobacco Use   Smoking status: Never     Passive exposure: Never   Smokeless tobacco: Never  Vaping Use   Vaping status: Never Used  Substance and Sexual Activity   Alcohol use: No    Alcohol/week: 0.0 standard drinks of alcohol   Drug use: No   Sexual activity: Not Currently    Birth control/protection: Post-menopausal  Other Topics Concern   Not on file  Social History Narrative   Not on file   Social Drivers of Health   Financial Resource Strain: Low Risk  (03/14/2022)   Overall Financial Resource Strain (CARDIA)    Difficulty of Paying Living Expenses: Not hard at all  Food Insecurity: Low Risk  (05/23/2023)   Received from [facility]   Hunger Vital Sign    Worried About Running Out of Food in the Last Year: Never true    Ran Out of Food in the Last Year: Never true  Transportation Needs: No Transportation Needs (05/23/2023)   Received from [facility]    In the past 12 months, has lack of reliable transportation kept you from medical appointments, meetings, work or from getting things needed for daily living? : No  Physical Activity: Inactive (03/14/2022)   Exercise Vital Sign    Days of Exercise per Week: 0 days    Minutes of Exercise per Session: 0 min  Stress: No Stress Concern Present (03/14/2022)   [employer] of Occupational Health - Occupational Stress Questionnaire    Feeling of Stress : Not at all  Social Connections: Unknown (03/15/2022)   Received from [facility], [facility]   Social Network    Social Network: Not on file  Intimate Partner Violence: Unknown (03/15/2022)   Received from [facility], [facility]   HITS    Physically Hurt: Not on file    Insult or Talk Down To: Not on file    Threaten Physical Harm: Not on file    Scream or Curse: Not on file    Family History  Problem Relation Age of Onset   Hypertension Mother    Breast cancer Neg Hx    Colon cancer Neg Hx    Colon polyps Neg Hx    Esophageal cancer Neg Hx    Rectal cancer Neg Hx     Stomach cancer Neg Hx     Past Surgical History:  Procedure Laterality Date   COLONOSCOPY  05/29/2022   JOINT REPLACEMENT N/A    Phreesia 08/20/2019   REPLACEMENT TOTAL KNEE Right     ROS: Review of Systems Negative except as stated above  PHYSICAL EXAM: BP 124/79   Pulse 87   Temp 97.8 F (36.6 C) (Oral)   Ht 4\' 9"  (1.448 m)   Wt 145 lb 12.8 oz (66.1 kg)   SpO2 91%   BMI 31.55 kg/m   Physical Exam HENT:     Head: Normocephalic and atraumatic.     Nose: Nose normal.     Mouth/Throat:     Mouth: Mucous membranes are moist.     Pharynx: Oropharynx is clear.  Eyes:     Extraocular Movements: Extraocular movements intact.     Conjunctiva/sclera: Conjunctivae normal.     Pupils: Pupils are equal, round, and reactive to light.  Cardiovascular:     Rate and Rhythm: Normal rate and regular rhythm.     Pulses: Normal pulses.     Heart sounds: Normal heart sounds.  Pulmonary:     Effort: Pulmonary effort is normal.     Breath sounds: Normal breath sounds.  Musculoskeletal:        General: Normal range of motion.     Cervical back: Normal range of motion and neck supple.  Neurological:     General: No focal deficit present.     Mental Status: She is alert and oriented to person, place, and time.  Psychiatric:        Mood and Affect: Mood normal.        Behavior: Behavior normal.   ASSESSMENT AND PLAN: 1. Encounter for routine dental examination (Primary) - Amoxicillin as prescribed. Counseled on medication adherence/adverse effects.  - Follow-up with primary provider as scheduled. - amoxicillin (AMOXIL) 500 MG capsule; Take 2 capsules (1,000 mg total) by mouth once for 1 dose.  Dispense: 2 capsule; Refill: 0   Patient was given the opportunity to ask questions.  Patient verbalized understanding of the plan and was able to repeat key elements of the plan. Patient was given clear instructions to go to Emergency Department or return to medical center if symptoms don't  improve, worsen, or new problems develop.The patient verbalized understanding.    Requested Prescriptions   Signed Prescriptions Disp Refills   amoxicillin (AMOXIL) 500 MG capsule 2 capsule 0    Sig: Take 2 capsules (1,000 mg total) by mouth once for 1 dose.  Follow-up with primary provider as scheduled.  Rema Fendt, NP

## 2023-06-22 NOTE — Progress Notes (Signed)
 Patient states nothing else to discuss.

## 2023-06-28 ENCOUNTER — Other Ambulatory Visit: Payer: Self-pay | Admitting: Family

## 2023-06-28 DIAGNOSIS — E785 Hyperlipidemia, unspecified: Secondary | ICD-10-CM

## 2023-06-28 NOTE — Telephone Encounter (Signed)
 Copied from CRM [id number]. Topic: Clinical - Medication Refill >> Jun 28, 2023  4:20 PM Yolanda T wrote: Most Recent Primary Care Visit:  Provider: Ricky Stabs J  Department: [facility]  Visit Type: OFFICE VISIT  Date: 06/22/2023  Medication:  atorvastatin (LIPITOR) 20 MG tablet    Has the patient contacted their pharmacy? Yes  Is this the correct pharmacy for this prescription? Yes If no, delete pharmacy and type the correct one.  This is the patient's preferred pharmacy:  [id number] - [place], [place] - [address] AT [address] OF [facility] [address] [place] [address] Phone: [phone] Fax: [phone]   Has the prescription been filled recently? Yes  Is the patient out of the medication? No  Has the patient been seen for an appointment in the last year OR does the patient have an upcoming appointment? Yes  Can we respond through MyChart? No  Agent: Please be advised that Rx refills may take up to 3 business days. We ask that you follow-up with your pharmacy.

## 2023-06-29 MED ORDER — ATORVASTATIN CALCIUM 20 MG PO TABS
20.0000 mg | ORAL_TABLET | Freq: Every day | ORAL | 1 refills | Status: DC
Start: 2023-06-29 — End: 2023-10-08

## 2023-06-29 NOTE — Telephone Encounter (Signed)
 Requested Prescriptions  Pending Prescriptions Disp Refills   atorvastatin (LIPITOR) 20 MG tablet 90 tablet 1    Sig: Take 1 tablet (20 mg total) by mouth daily.     Cardiovascular:  Antilipid - Statins Failed - 06/29/2023  1:27 PM      Failed - Lipid Panel in normal range within the last 12 months    Cholesterol, Total  Date Value Ref Range Status  01/08/2023 135 100 - 199 mg/dL Final   LDL Chol Calc (NIH)  Date Value Ref Range Status  01/08/2023 61 0 - 99 mg/dL Final   HDL  Date Value Ref Range Status  01/08/2023 64 >39 mg/dL Final   Triglycerides  Date Value Ref Range Status  01/08/2023 44 0 - 149 mg/dL Final         Passed - Patient is not pregnant      Passed - Valid encounter within last 12 months    Recent Outpatient Visits           1 week ago Encounter for routine dental examination   [place] [facility], Amy J, NP   2 months ago Vertigo   [place] [facility], Amy J, NP   2 months ago Primary hypertension   [place] [facility], Amy J, NP   5 months ago Primary hypertension   [place] [facility], Amy J, NP   12 months ago Primary hypertension   [place] [facility], Salomon Fick, NP       Future Appointments             In 1 week Rema Fendt, NP [facility]

## 2023-07-09 ENCOUNTER — Ambulatory Visit (INDEPENDENT_AMBULATORY_CARE_PROVIDER_SITE_OTHER): Payer: 59 | Admitting: Family

## 2023-07-09 ENCOUNTER — Encounter: Payer: Self-pay | Admitting: Physician Assistant

## 2023-07-09 ENCOUNTER — Encounter: Payer: Self-pay | Admitting: Family

## 2023-07-09 VITALS — BP 123/80 | HR 78 | Temp 98.5°F | Resp 16 | Ht <= 58 in | Wt 145.6 lb

## 2023-07-09 DIAGNOSIS — E785 Hyperlipidemia, unspecified: Secondary | ICD-10-CM

## 2023-07-09 DIAGNOSIS — I10 Essential (primary) hypertension: Secondary | ICD-10-CM | POA: Diagnosis not present

## 2023-07-09 DIAGNOSIS — Z1321 Encounter for screening for nutritional disorder: Secondary | ICD-10-CM

## 2023-07-09 DIAGNOSIS — R6889 Other general symptoms and signs: Secondary | ICD-10-CM | POA: Diagnosis not present

## 2023-07-09 MED ORDER — AMLODIPINE BESYLATE 5 MG PO TABS
5.0000 mg | ORAL_TABLET | Freq: Every day | ORAL | 0 refills | Status: DC
Start: 2023-07-09 — End: 2023-10-08

## 2023-07-09 MED ORDER — LISINOPRIL 20 MG PO TABS
20.0000 mg | ORAL_TABLET | Freq: Every day | ORAL | 0 refills | Status: DC
Start: 2023-07-09 — End: 2023-10-08

## 2023-07-09 NOTE — Progress Notes (Signed)
 Patient ID: April Randall, female    DOB: Jul 25, 1950  MRN: [id number]  CC: Chronic Conditions Follow-Up  Subjective: April Randall is a 73 y.o. female who presents for chronic conditions follow-up.  Her concerns today include:  - Doing well on Lisinopril and Amlodipine, no issues/concerns. She does not complain of red flag symptoms such as but not limited to chest pain, shortness of breath, worst headache of life, nausea/vomiting.  - Doing well on Atorvastatin, no issues/concerns. - States she is forgetful. States for example she forgets things on her To Do List.  - Requests Vitamin D lab.   Patient Active Problem List   Diagnosis Date Noted   Prediabetes 05/11/2021   History of right knee joint replacement 01/29/2020   Hyperlipidemia 08/22/2019   Arthritis    Essential hypertension    S/P left unicompartmental knee replacement 12/04/2011   Arthritis of knee 08/30/2011     Current Outpatient Medications on File Prior to Visit  Medication Sig Dispense Refill   aspirin EC 81 MG tablet Take 1 tablet (81 mg total) by mouth every 6 (six) hours as needed. Swallow whole. 90 tablet 0   atorvastatin (LIPITOR) 20 MG tablet Take 1 tablet (20 mg total) by mouth daily. 90 tablet 1   Blood Pressure Monitor DEVI Use as directed to check home blood pressure 2-3 times a week 1 each 0   ASPIRIN LOW DOSE 81 MG tablet TAKE 1 TABLET BY MOUTH EVERY DAY *SWALLOW WHOLE* (Patient not taking: Reported on 06/22/2023) 90 tablet 0   cetirizine (ZYRTEC) 5 MG tablet TAKE 1 TABLET BY MOUTH EVERY DAY (Patient not taking: Reported on 06/22/2023) 90 tablet 0   No current facility-administered medications on file prior to visit.    No Known Allergies  Social History   Socioeconomic History   Marital status: Single    Spouse name: Not on file   Number of children: Not on file   Years of education: Not on file   Highest education level: Not on file  Occupational History   Not on file  Tobacco Use    Smoking status: Never    Passive exposure: Never   Smokeless tobacco: Never  Vaping Use   Vaping status: Never Used  Substance and Sexual Activity   Alcohol use: No    Alcohol/week: 0.0 standard drinks of alcohol   Drug use: No   Sexual activity: Not Currently    Birth control/protection: Post-menopausal  Other Topics Concern   Not on file  Social History Narrative   Not on file   Social Drivers of Health   Financial Resource Strain: Low Risk  (03/14/2022)   Overall Financial Resource Strain (CARDIA)    Difficulty of Paying Living Expenses: Not hard at all  Food Insecurity: Low Risk  (05/23/2023)   Received from [facility]   Hunger Vital Sign    Worried About Running Out of Food in the Last Year: Never true    Ran Out of Food in the Last Year: Never true  Transportation Needs: No Transportation Needs (05/23/2023)   Received from [facility]    In the past 12 months, has lack of reliable transportation kept you from medical appointments, meetings, work or from getting things needed for daily living? : No  Physical Activity: Inactive (03/14/2022)   Exercise Vital Sign    Days of Exercise per Week: 0 days    Minutes of Exercise per Session: 0 min  Stress: No Stress Concern  Present (03/14/2022)   [employer] of Occupational Health - Occupational Stress Questionnaire    Feeling of Stress : Not at all  Social Connections: Unknown (03/15/2022)   [facility]   Social Network    Social Network: Not on file  Intimate Partner Violence: Unknown (03/15/2022)   Received from [facility]   HITS    Physically Hurt: Not on file    Insult or Talk Down To: Not on file    Threaten Physical Harm: Not on file    Scream or Curse: Not on file    Family History  Problem Relation Age of Onset   Hypertension Mother    Breast cancer Neg Hx    Colon cancer Neg Hx    Colon polyps Neg Hx    Esophageal cancer Neg Hx     Rectal cancer Neg Hx    Stomach cancer Neg Hx     Past Surgical History:  Procedure Laterality Date   COLONOSCOPY  05/29/2022   JOINT REPLACEMENT N/A    [employer]reesia 08/20/2019   REPLACEMENT TOTAL KNEE Right     ROS: Review of Systems Negative except as stated above  PHYSICAL EXAM: BP 123/80   Pulse 78   Temp 98.5 F (36.9 C) (Oral)   Resp 16   Ht 4\' 9"  (1.448 m)   Wt 145 lb 9.6 oz (66 kg)   SpO2 95%   BMI 31.51 kg/m   Physical Exam HENT:     Head: Normocephalic and atraumatic.     Nose: Nose normal.     Mouth/Throat:     Mouth: Mucous membranes are moist.     Pharynx: Oropharynx is clear.  Eyes:     Extraocular Movements: Extraocular movements intact.     Conjunctiva/sclera: Conjunctivae normal.     Pupils: Pupils are equal, round, and reactive to light.  Cardiovascular:     Rate and Rhythm: Normal rate and regular rhythm.     Pulses: Normal pulses.     Heart sounds: Normal heart sounds.  Pulmonary:     Effort: Pulmonary effort is normal.     Breath sounds: Normal breath sounds.  Musculoskeletal:        General: Normal range of motion.     Cervical back: Normal range of motion and neck supple.  Neurological:     General: No focal deficit present.     Mental Status: She is alert and oriented to person, place, and time.  Psychiatric:        Mood and Affect: Mood normal.        Behavior: Behavior normal.     ASSESSMENT AND PLAN: 1. Primary hypertension (Primary) - Continue Lisinopril and Amlodipine as prescribed.  - Counseled on blood pressure goal of less than 140/90, low-sodium, DASH diet, medication compliance, 150 minutes of moderate intensity exercise per week as tolerated. Discussed medication compliance, adverse effects. - Follow-up with primary provider in 3 months or sooner if needed. - amLODipine (NORVASC) 5 MG tablet; Take 1 tablet (5 mg total) by mouth daily.  Dispense: 90 tablet; Refill: 0 - lisinopril (ZESTRIL) 20 MG tablet; Take 1 tablet (20 mg  total) by mouth daily.  Dispense: 90 tablet; Refill: 0  2. Hyperlipidemia, unspecified hyperlipidemia type - Continue Atorvastatin as prescribed. Counseled on medication adherence/adverse effects. No refills needed as of present.  - Follow-up with primary provider in 3 months or sooner if needed.  3. Forgetfulness - Referral to Neurology for evaluation/management. -  Ambulatory referral to Neurology  4. Encounter for vitamin deficiency screening - Routine screening.  - Vitamin D, 25-hydroxy   Patient was given the opportunity to ask questions.  Patient verbalized understanding of the plan and was able to repeat key elements of the plan. Patient was given clear instructions to go to Emergency Department or return to medical center if symptoms don't improve, worsen, or new problems develop.The patient verbalized understanding.   Orders Placed This Encounter  Procedures   Vitamin D, 25-hydroxy   Ambulatory referral to Neurology     Requested Prescriptions   Signed Prescriptions Disp Refills   amLODipine (NORVASC) 5 MG tablet 90 tablet 0    Sig: Take 1 tablet (5 mg total) by mouth daily.   lisinopril (ZESTRIL) 20 MG tablet 90 tablet 0    Sig: Take 1 tablet (20 mg total) by mouth daily.    Return in about 3 months (around 10/08/2023) for Follow-Up or next available chronic conditions.  Senaida Dama, NP

## 2023-07-09 NOTE — Progress Notes (Signed)
 3 month follow up on chronic conditions.

## 2023-07-10 ENCOUNTER — Other Ambulatory Visit: Payer: Self-pay | Admitting: Family

## 2023-07-10 ENCOUNTER — Encounter: Payer: Self-pay | Admitting: Family

## 2023-07-10 DIAGNOSIS — E559 Vitamin D deficiency, unspecified: Secondary | ICD-10-CM

## 2023-07-10 LAB — VITAMIN D 25 HYDROXY (VIT D DEFICIENCY, FRACTURES): Vit D, 25-Hydroxy: 12.2 ng/mL — ABNORMAL LOW (ref 30.0–100.0)

## 2023-07-10 MED ORDER — VITAMIN D (ERGOCALCIFEROL) 1.25 MG (50000 UNIT) PO CAPS
50000.0000 [IU] | ORAL_CAPSULE | ORAL | 0 refills | Status: AC
Start: 2023-07-10 — End: 2023-09-26

## 2023-07-29 ENCOUNTER — Other Ambulatory Visit: Payer: Self-pay | Admitting: Family

## 2023-07-29 DIAGNOSIS — Z7901 Long term (current) use of anticoagulants: Secondary | ICD-10-CM

## 2023-07-30 NOTE — Telephone Encounter (Signed)
 Complete

## 2023-08-20 ENCOUNTER — Other Ambulatory Visit: Payer: Self-pay | Admitting: Family

## 2023-08-20 DIAGNOSIS — E559 Vitamin D deficiency, unspecified: Secondary | ICD-10-CM

## 2023-08-22 ENCOUNTER — Encounter

## 2023-08-22 ENCOUNTER — Encounter: Payer: Self-pay | Admitting: Physician Assistant

## 2023-08-22 ENCOUNTER — Ambulatory Visit (INDEPENDENT_AMBULATORY_CARE_PROVIDER_SITE_OTHER): Payer: Self-pay | Admitting: Physician Assistant

## 2023-08-22 ENCOUNTER — Other Ambulatory Visit

## 2023-08-22 VITALS — BP 135/87 | HR 87 | Resp 20 | Ht <= 58 in | Wt 150.0 lb

## 2023-08-22 DIAGNOSIS — R413 Other amnesia: Secondary | ICD-10-CM

## 2023-08-22 NOTE — Patient Instructions (Addendum)
 It was a pleasure to see you today at our office.   Recommendations:   MRI of the brain, the radiology office will call you to arrange you appointment  Replenish the Vit D, monitor the high Calcium  Check labs today B12 Follow up in  4-5 month  If you have any severe symptoms of a stroke, or other severe issues such as confusion,severe chills or fever, etc call 911 or go to the ER as you may need to be evaluated further For guidance regarding [facility] and if placement were needed at the facility, contact Social Worker tel: [phone]  For assessment of decision of mental capacity and competency:  Call Dr. Laverne Potter, geriatric psychiatrist at [phone] Counseling regarding caregiver distress, including caregiver depression, anxiety and issues regarding community resources, adult day care programs, adult living facilities, or memory care questions:  please contact your  Primary Doctor's Social Worker   FOR Memory  decline, memory medications: Call our office [phone]    [email or web address]   RECOMMENDATIONS FOR ALL PATIENTS WITH MEMORY PROBLEMS: 1. Continue to exercise (Recommend 30 minutes of walking everyday, or 3 hours every week) 2. Increase social interactions - continue going to [place] and enjoy social gatherings with friends and family 3. Eat healthy, avoid fried foods and eat more fruits and vegetables 4. Maintain adequate blood pressure, blood sugar, and blood cholesterol level. Reducing the risk of stroke and cardiovascular disease also helps promoting better memory. 5. Avoid stressful situations. Live a simple life and avoid aggravations. Organize your time and prepare for the next day in anticipation. 6. Sleep well, avoid any interruptions of sleep and avoid any distractions in the bedroom that may interfere with adequate sleep quality 7. Avoid sugar, avoid sweets as there is a strong link  between excessive sugar intake, diabetes, and cognitive impairment We discussed the Mediterranean diet, which has been shown to help patients reduce the risk of progressive memory disorders and reduces cardiovascular risk. This includes eating fish, eat fruits and green leafy vegetables, nuts like almonds and hazelnuts, walnuts, and also use olive oil. Avoid fast foods and fried foods as much as possible. Avoid sweets and sugar as sugar use has been linked to worsening of memory function.  There is always a concern of gradual progression of memory problems. If this is the case, then we may need to adjust level of care according to patient needs. Support, both to the patient and caregiver, should then be put into place.       FALL PRECAUTIONS: Be cautious when walking. Scan the area for obstacles that may increase the risk of trips and falls. When getting up in the mornings, sit up at the edge of the bed for a few minutes before getting out of bed. Consider elevating the bed at the head end to avoid drop of blood pressure when getting up. Walk always in a well-lit room (use night lights in the walls). Avoid area rugs or power cords from appliances in the middle of the walkways. Use a walker or a cane if necessary and consider physical therapy for balance exercise. Get your eyesight checked regularly.  FINANCIAL OVERSIGHT: Supervision, especially oversight when making financial decisions or transactions is also recommended.  HOME SAFETY: Consider the safety of the kitchen when operating appliances like stoves, microwave oven, and blender. Consider having supervision and share cooking responsibilities until no longer able to participate in those. Accidents with firearms and other hazards in the house should be identified and  addressed as well.   ABILITY TO BE LEFT ALONE: If patient is unable to contact 911 operator, consider using LifeLine, or when the need is there, arrange for someone to stay with  patients. Smoking is a fire hazard, consider supervision or cessation. Risk of wandering should be assessed by caregiver and if detected at any point, supervision and safe proof recommendations should be instituted.  MEDICATION SUPERVISION: Inability to self-administer medication needs to be constantly addressed. Implement a mechanism to ensure safe administration of the medications.      Mediterranean Diet A Mediterranean diet refers to food and lifestyle choices that are based on the traditions of countries located on the [place]. This way of eating has been shown to help prevent certain conditions and improve outcomes for people who have chronic diseases, like kidney disease and heart disease. What are tips for following this plan? Lifestyle  Cook and eat meals together with your family, when possible. Drink enough fluid to keep your urine clear or pale yellow. Be physically active every day. This includes: Aerobic exercise like running or swimming. Leisure activities like gardening, walking, or housework. Get 7-8 hours of sleep each night. If recommended by your health care provider, drink red wine in moderation. This means 1 glass a day for nonpregnant women and 2 glasses a day for men. A glass of wine equals 5 oz (150 mL). Reading food labels  Check the serving size of packaged foods. For foods such as rice and pasta, the serving size refers to the amount of cooked product, not dry. Check the total fat in packaged foods. Avoid foods that have saturated fat or trans fats. Check the ingredients list for added sugars, such as corn syrup. Shopping  At the grocery store, buy most of your food from the areas near the walls of the store. This includes: Fresh fruits and vegetables (produce). Grains, beans, nuts, and seeds. Some of these may be available in unpackaged forms or large amounts (in bulk). Fresh seafood. Poultry and eggs. Low-fat dairy products. Buy whole ingredients  instead of prepackaged foods. Buy fresh fruits and vegetables in-season from local farmers markets. Buy frozen fruits and vegetables in resealable bags. If you do not have access to quality fresh seafood, buy precooked frozen shrimp or canned fish, such as tuna, salmon, or sardines. Buy small amounts of raw or cooked vegetables, salads, or olives from the deli or salad bar at your store. Stock your pantry so you always have certain foods on hand, such as olive oil, canned tuna, canned tomatoes, rice, pasta, and beans. Cooking  Cook foods with extra-virgin olive oil instead of using butter or other vegetable oils. Have meat as a side dish, and have vegetables or grains as your main dish. This means having meat in small portions or adding small amounts of meat to foods like pasta or stew. Use beans or vegetables instead of meat in common dishes like chili or lasagna. Experiment with different cooking methods. Try roasting or broiling vegetables instead of steaming or sauteing them. Add frozen vegetables to soups, stews, pasta, or rice. Add nuts or seeds for added healthy fat at each meal. You can add these to yogurt, salads, or vegetable dishes. Marinate fish or vegetables using olive oil, lemon juice, garlic, and fresh herbs. Meal planning  Plan to eat 1 vegetarian meal one day each week. Try to work up to 2 vegetarian meals, if possible. Eat seafood 2 or more times a week. Have healthy snacks readily available,  such as: Vegetable sticks with hummus. Greek yogurt. Fruit and nut trail mix. Eat balanced meals throughout the week. This includes: Fruit: 2-3 servings a day Vegetables: 4-5 servings a day Low-fat dairy: 2 servings a day Fish, poultry, or lean meat: 1 serving a day Beans and legumes: 2 or more servings a week Nuts and seeds: 1-2 servings a day Whole grains: 6-8 servings a day Extra-virgin olive oil: 3-4 servings a day Limit red meat and sweets to only a few servings a  month What are my food choices? Mediterranean diet Recommended Grains: Whole-grain pasta. Brown rice. Bulgar wheat. Polenta. Couscous. Whole-wheat bread. Dwyane Glad. Vegetables: Artichokes. Beets. Broccoli. Cabbage. Carrots. Eggplant. Green beans. Chard. Kale. Spinach. Onions. Leeks. Peas. Squash. Tomatoes. Peppers. Radishes. Fruits: Apples. Apricots. Avocado. Berries. Bananas. Cherries. Dates. Figs. Grapes. Lemons. Melon. Oranges. Peaches. Plums. Pomegranate. Meats and other protein foods: Beans. Almonds. Sunflower seeds. Pine nuts. Peanuts. Cod. Salmon. Scallops. Shrimp. Tuna. Tilapia. Clams. Oysters. Eggs. Dairy: Low-fat milk. Cheese. Greek yogurt. Beverages: Water. Red wine. Herbal tea. Fats and oils: Extra virgin olive oil. Avocado oil. Grape seed oil. Sweets and desserts: [place] yogurt with honey. Baked apples. Poached pears. Trail mix. Seasoning and other foods: Basil. Cilantro. Coriander. Cumin. Mint. Parsley. Sage. Rosemary. Tarragon. Garlic. Oregano. Thyme. Pepper. Balsalmic vinegar. Tahini. Hummus. Tomato sauce. Olives. Mushrooms. Limit these Grains: Prepackaged pasta or rice dishes. Prepackaged cereal with added sugar. Vegetables: Deep fried potatoes (french fries). Fruits: Fruit canned in syrup. Meats and other protein foods: Beef. Pork. Lamb. Poultry with skin. Hot dogs. Helene Loader. Dairy: Ice cream. Sour cream. Whole milk. Beverages: Juice. Sugar-sweetened soft drinks. Beer. Liquor and spirits. Fats and oils: Butter. Canola oil. Vegetable oil. Beef fat (tallow). Lard. Sweets and desserts: Cookies. Cakes. Pies. Candy. Seasoning and other foods: Mayonnaise. Premade sauces and marinades. The items listed may not be a complete list. Talk with your dietitian about what dietary choices are right for you. Summary The Mediterranean diet includes both food and lifestyle choices. Eat a variety of fresh fruits and vegetables, beans, nuts, seeds, and whole grains. Limit the amount of red  meat and sweets that you eat. Talk with your health care provider about whether it is safe for you to drink red wine in moderation. This means 1 glass a day for nonpregnant women and 2 glasses a day for men. A glass of wine equals 5 oz (150 mL). This information is not intended to replace advice given to you by your health care provider. Make sure you discuss any questions you have with your health care provider. Document Released: 11/04/2015 Document Revised: 12/07/2015 Document Reviewed: 11/04/2015 Elsevier Interactive Patient Education  2017 [employer].

## 2023-08-22 NOTE — Progress Notes (Signed)
 Assessment/Plan:     April Randall is a very pleasant 73 y.o. year old RH female  from [place] with a history of hypertension, hyperlipidemia, arthritis, prediabetes,  hyperparathyroidism, seen today for evaluation of memory loss. MoCA today is 22/30 . The etiology is unclear, likely multifactorial given very abnormal Ca Above 11 and vit D at 12 that could contribute to reversible causes of memory loss.  Workup is in progress.    Memory Impairment of unclear etiology  MRI brain without contrast to assess for underlying structural abnormality and assess vascular load  Neurocognitive testing to further evaluate cognitive concerns and determine other underlying cause of memory changes, including potential contribution from sleep, anxiety, attention, or depression among others  Check B12  Replenish vit D (12.2), monitor Ca, follow with Endocrine. Recommend good control of cardiovascular risk factors.   Continue to control mood as per PCP Will consider ACHI if after adjusting her metabolic abnormalities memory issues continue, or if major abnormalities seen on MRI  issues continue  Folllow up in 4-5 months   Subjective:    The patient is here alone    How long did patient have memory difficulties? " For about 6 months".  Patient reports some difficulty remembering new information, recent conversations, names. She forgets items despite writing them on her to do list. LTM is good. repeats oneself? Denies  Disoriented when walking into a room? Denies    Leaving objects in unusual places?  Denies.   Wandering behavior? Denies.   Any personality changes, or depression, anxiety? Denies   Hallucinations or paranoia? Denies.   Seizures? Denies.    Any sleep changes?  Sleeps well. Denies frequent nightmares or dream reenactment, other REM behavior or sleepwalking   Sleep apnea? Denies.   Any hygiene concerns?  Denies.   Independent of bathing and dressing? Endorsed  Does the patient need  help with medications? Patient is in charge   Who is in charge of the finances? Patient is in charge     Any changes in appetite?   Denies.     Patient have trouble swallowing?  Denies.   Does the patient cook? Yes, denies forgetting common recipes or kitchen accidents   Any headaches?  When she was having periods  Chronic pain? She reports some B knee pain due to arthritis   Ambulates with difficulty? Denies. Recent falls or head injuries? Denies.     Vision changes?  Denies any new issues.   Any strokelike symptoms? Denies.   Any tremors? Denies.   Any anosmia? Denies.   Any incontinence of urine? Denies.   Any bowel dysfunction? Intermittent constipation. Patient lives with her daughter.   History of heavy alcohol intake? Denies.   History of heavy tobacco use? Denies.   Family history of dementia? Denies   Does patient drive? Never drove.  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 HS  No Known Allergies  Current Outpatient Medications  Medication Instructions   amLODipine  (NORVASC ) 5 mg, Oral, Daily   ASPIRIN  LOW DOSE 81 MG tablet TAKE 1 TABLET BY MOUTH EVERY DAY *SWALLOW WHOLE*   ASPIRIN  LOW DOSE 81 MG tablet TAKE 1 TABLET BY MOUTH EVERY 6 HOURS AS NEEDED. SWALLOW WHOLE.   atorvastatin  (LIPITOR) 20 mg, Oral, Daily   Blood Pressure Monitor DEVI Use as directed to check home blood pressure 2-3 times a week   cetirizine  (ZYRTEC ) 5 mg, Oral, Daily   lisinopril  (ZESTRIL ) 20 mg, Oral, Daily   Vitamin D  (  Ergocalciferol ) (DRISDOL ) 50,000 Units, Oral, Every 7 days     VITALS:   Vitals:   08/22/23 0944  BP: 135/87  Pulse: 87  Resp: 20  SpO2: 95%  Weight: 150 lb (68 kg)  Height: 4\' 9"  (1.448 m)      PHYSICAL EXAM   HEENT:  Normocephalic, atraumatic.  The superficial temporal arteries are without ropiness or tenderness. Cardiovascular: Regular rate and rhythm. Lungs: Clear to auscultation bilaterally. Neck: There are no carotid bruits noted  bilaterally.  NEUROLOGICAL:    08/22/2023   10:00 AM  Montreal Cognitive Assessment   Visuospatial/ Executive (0/5) 1  Naming (0/3) 2  Attention: Read list of digits (0/2) 1  Attention: Read list of letters (0/1) 1  Attention: Serial 7 subtraction starting at 100 (0/3) 3  Language: Repeat phrase (0/2) 1  Language : Fluency (0/1) 1  Abstraction (0/2) 2  Delayed Recall (0/5) 3  Orientation (0/6) 6  Total 21  Adjusted Score (based on education) 22       06/09/2020    2:26 PM  MMSE - Mini Mental State Exam  Orientation to time 5  Orientation to Place 5  Registration 3  Attention/ Calculation 5  Recall 3  Language- name 2 objects 2  Language- repeat 1  Language- follow 3 step command 3  Language- read & follow direction 1  Write a sentence 1  Copy design 0  Total score 29     Orientation:  Alert and oriented to person, place and  time . No aphasia or dysarthria. Fund of knowledge is appropriate. Recent memory impaired, remote memory is normal.  Attention and concentration are normal.  Able to name objects and repeat phrases.   Delayed recall  3/5  Cranial nerves: There is good facial symmetry. Extraocular muscles are intact and visual fields are full to confrontational testing. Speech is fluent and clear. No tongue deviation. Hearing is intact to conversational tone.  Tone: Tone is good throughout. Sensation: Sensation is intact to light touch.  Vibration is intact at the bilateral big toe.  Coordination: The patient has no difficulty with RAM's or FNF bilaterally. Normal finger to nose  Motor: Strength is 5/5 in the bilateral upper and lower extremities. There is no pronator drift. There are no fasciculations noted. DTR's: Deep tendon reflexes are 2/4 bilaterally. Gait and Station: The patient is able to ambulate without difficulty. Gait is cautious and narrow. Stride length is normal.        Thank you for allowing us  the opportunity to participate in the care of this nice  patient. Please do not hesitate to contact us  for any questions or concerns.   Total time spent on today's visit was 60 minutes dedicated to this patient today, preparing to see patient, examining the patient, ordering tests and/or medications and counseling the patient, documenting clinical information in the EHR or other health record, independently interpreting results and communicating results to the patient/family, discussing treatment and goals, answering patient's questions and coordinating care.  Cc:  Senaida Dama, NP  Tex Filbert 08/22/2023 10:40 AM

## 2023-08-23 ENCOUNTER — Ambulatory Visit: Payer: Self-pay | Admitting: Physician Assistant

## 2023-08-23 LAB — VITAMIN B12: Vitamin B-12: 817 pg/mL (ref 200–1100)

## 2023-09-04 ENCOUNTER — Ambulatory Visit
Admission: RE | Admit: 2023-09-04 | Discharge: 2023-09-04 | Disposition: A | Discharge status: Home / Self Care | Source: Ambulatory Visit | Attending: Physician Assistant | Admitting: Physician Assistant

## 2023-09-12 ENCOUNTER — Encounter: Payer: Self-pay | Admitting: Family

## 2023-09-12 ENCOUNTER — Ambulatory Visit (INDEPENDENT_AMBULATORY_CARE_PROVIDER_SITE_OTHER): Admitting: Family

## 2023-09-12 VITALS — BP 120/76 | HR 79 | Temp 98.4°F | Resp 16 | Ht <= 58 in | Wt 150.6 lb

## 2023-09-12 DIAGNOSIS — I10 Essential (primary) hypertension: Secondary | ICD-10-CM

## 2023-09-12 DIAGNOSIS — Z0289 Encounter for other administrative examinations: Secondary | ICD-10-CM

## 2023-09-12 DIAGNOSIS — E785 Hyperlipidemia, unspecified: Secondary | ICD-10-CM | POA: Diagnosis not present

## 2023-09-12 DIAGNOSIS — J329 Chronic sinusitis, unspecified: Secondary | ICD-10-CM

## 2023-09-12 DIAGNOSIS — R42 Dizziness and giddiness: Secondary | ICD-10-CM

## 2023-09-12 DIAGNOSIS — R6889 Other general symptoms and signs: Secondary | ICD-10-CM | POA: Diagnosis not present

## 2023-09-12 NOTE — Progress Notes (Signed)
 Patient ID: April Randall, female    DOB: [birth year]  MRN: [id number]  CC: FMLA Paperwork  Subjective: April Randall is a 73 y.o. female who presents for FMLA paperwork.   Her concerns today include:  - Patient states she needs FMLA paperwork renewed so that her daughter can assist her to appointments or that she can use her daughter's car to ger to her appointments.  - States she was seen by Neurology and told she has sinusitis. States Neurology told her that she needs to be seen by ENT.   Patient Active Problem List   Diagnosis Date Noted   Prediabetes 05/11/2021   History of right knee joint replacement 01/29/2020   Hyperlipidemia 08/22/2019   Arthritis    Essential hypertension    S/P left unicompartmental knee replacement 12/04/2011   Arthritis of knee 08/30/2011     Current Outpatient Medications on File Prior to Visit  Medication Sig Dispense Refill   amLODipine  (NORVASC ) 5 MG tablet Take 1 tablet (5 mg total) by mouth daily. 90 tablet 0   ASPIRIN  LOW DOSE 81 MG tablet TAKE 1 TABLET BY MOUTH EVERY DAY *SWALLOW WHOLE* 90 tablet 0   ASPIRIN  LOW DOSE 81 MG tablet TAKE 1 TABLET BY MOUTH EVERY 6 HOURS AS NEEDED. SWALLOW WHOLE. 90 tablet 0   atorvastatin  (LIPITOR) 20 MG tablet Take 1 tablet (20 mg total) by mouth daily. 90 tablet 1   Blood Pressure Monitor DEVI Use as directed to check home blood pressure 2-3 times a week 1 each 0   cetirizine  (ZYRTEC ) 5 MG tablet TAKE 1 TABLET BY MOUTH EVERY DAY 90 tablet 0   lisinopril  (ZESTRIL ) 20 MG tablet Take 1 tablet (20 mg total) by mouth daily. 90 tablet 0   Vitamin D , Ergocalciferol , (DRISDOL ) 1.25 MG (50000 UNIT) CAPS capsule Take 1 capsule (50,000 Units total) by mouth every 7 (seven) days for 12 doses. 12 capsule 0   No current facility-administered medications on file prior to visit.    No Known Allergies  Social History   Socioeconomic History   Marital status: Single    Spouse name: Not on file   Number of children:  4   Years of education: Not on file   Highest education level: Not on file  Occupational History   Not on file  Tobacco Use   Smoking status: Never    Passive exposure: Never   Smokeless tobacco: Never  Vaping Use   Vaping status: Never Used  Substance and Sexual Activity   Alcohol use: No    Alcohol/week: 0.0 standard drinks of alcohol   Drug use: No   Sexual activity: Not Currently    Birth control/protection: Post-menopausal  Other Topics Concern   Not on file  Social History Narrative   Right handed   Ranch home   Social Drivers of Health   Financial Resource Strain: Low Risk  (03/14/2022)   Overall Financial Resource Strain (CARDIA)    Difficulty of Paying Living Expenses: Not hard at all  Food Insecurity: Low Risk  (05/23/2023)   Received from [facility]   Hunger Vital Sign    Within the past 12 months, you worried that your food would run out before you got money to buy more: Never true    Within the past 12 months, the food you bought just didn't last and you didn't have money to get more. : Never true  Transportation Needs: No Transportation Needs (05/23/2023)   Received from [facility]  Transportation    In the past 12 months, has lack of reliable transportation kept you from medical appointments, meetings, work or from getting things needed for daily living? : No  Physical Activity: Inactive (03/14/2022)   Exercise Vital Sign    Days of Exercise per Week: 0 days    Minutes of Exercise per Session: 0 min  Stress: No Stress Concern Present (03/14/2022)   [employer] of Occupational Health - Occupational Stress Questionnaire    Feeling of Stress : Not at all  Social Connections: Unknown (03/15/2022)   Received from [facility]   Social Network    Social Network: Not on file  Intimate Partner Violence: Unknown (03/15/2022)   Received from [facility]   HITS    Physically Hurt: Not on file    Insult or Talk Down To: Not on file    Threaten  Physical Harm: Not on file    Scream or Curse: Not on file    Family History  Problem Relation Age of Onset   Hypertension Mother    Breast cancer Neg Hx    Colon cancer Neg Hx    Colon polyps Neg Hx    Esophageal cancer Neg Hx    Rectal cancer Neg Hx    Stomach cancer Neg Hx     Past Surgical History:  Procedure Laterality Date   COLONOSCOPY  05/29/2022   JOINT REPLACEMENT N/A    [employer] 08/20/2019   REPLACEMENT TOTAL KNEE Right     ROS: Review of Systems Negative except as stated above  PHYSICAL EXAM: BP 120/76   Pulse 79   Temp 98.4 F (36.9 C) (Axillary)   Resp 16   Ht 4' 9 (1.448 m)   Wt 150 lb 9.6 oz (68.3 kg)   SpO2 97%   BMI 32.59 kg/m   Physical Exam HENT:     Head: Normocephalic and atraumatic.     Nose: Nose normal.     Mouth/Throat:     Mouth: Mucous membranes are moist.     Pharynx: Oropharynx is clear.   Eyes:     Extraocular Movements: Extraocular movements intact.     Conjunctiva/sclera: Conjunctivae normal.     Pupils: Pupils are equal, round, and reactive to light.    Cardiovascular:     Rate and Rhythm: Normal rate and regular rhythm.     Pulses: Normal pulses.     Heart sounds: Normal heart sounds.  Pulmonary:     Effort: Pulmonary effort is normal.     Breath sounds: Normal breath sounds.   Musculoskeletal:        General: Normal range of motion.     Cervical back: Normal range of motion and neck supple.   Neurological:     General: No focal deficit present.     Mental Status: She is alert and oriented to person, place, and time.   Psychiatric:        Mood and Affect: Mood normal.        Behavior: Behavior normal.     ASSESSMENT AND PLAN: 1. Encounter for completion of form with patient (Primary) 2. Primary hypertension 3. Hyperlipidemia, unspecified hyperlipidemia type 4. Forgetfulness 5. Vertigo - FMLA Certification of Health Care Provider for the Serious Health Condition paperwork completed in office.  - I  discussed with patient in detail that since patient established with me on 12/13/2020 I have not seen her daughter accompany her to any office visits. Patient verbalized understanding and states she plans  to have her daughter come with her to an office visit soon.  6. Sinusitis, unspecified chronicity, unspecified location - Referral to ENT for evaluation/management. - Ambulatory referral to ENT   Patient was given the opportunity to ask questions.  Patient verbalized understanding of the plan and was able to repeat key elements of the plan. Patient was given clear instructions to go to Emergency Department or return to medical center if symptoms don't improve, worsen, or new problems develop.The patient verbalized understanding.   Orders Placed This Encounter  Procedures   Ambulatory referral to ENT   Follow-up with primary provider as scheduled.  Senaida Dama, NP

## 2023-09-17 ENCOUNTER — Telehealth: Payer: Self-pay | Admitting: Family

## 2023-09-17 NOTE — Telephone Encounter (Signed)
 Patient came in wanting a date change on FMLA paperwork, I spoke with provider and then let patient know we cannot change dates that were placed on paperwork because the dates that were placed were put there because that is the accurate time she comes in to our office . Patient understood.

## 2023-09-17 NOTE — Telephone Encounter (Signed)
 A document form from [employer] has been faxed: FMLA additional information needed, to be filled out by provider. Send document back via Fax within 7-days Needed by 06/30. Document is located in providers tray at front office.          Fax number: [phone]

## 2023-09-19 ENCOUNTER — Other Ambulatory Visit: Payer: Self-pay | Admitting: Family

## 2023-09-19 DIAGNOSIS — Z1231 Encounter for screening mammogram for malignant neoplasm of breast: Secondary | ICD-10-CM

## 2023-09-20 ENCOUNTER — Other Ambulatory Visit: Payer: Self-pay | Admitting: Family

## 2023-09-20 DIAGNOSIS — E559 Vitamin D deficiency, unspecified: Secondary | ICD-10-CM

## 2023-09-20 NOTE — Telephone Encounter (Signed)
 Patient has been called and is aware that her Vit D will be recheck at her next appt

## 2023-09-24 NOTE — Telephone Encounter (Signed)
 I called patient no one answered so I left a voicemail to return my call

## 2023-09-24 NOTE — Telephone Encounter (Signed)
 Please specify additional information needed by [employer] regarding FMLA and I will advise at that time.

## 2023-09-25 ENCOUNTER — Telehealth: Payer: Self-pay

## 2023-09-25 ENCOUNTER — Telehealth: Payer: Self-pay | Admitting: Emergency Medicine

## 2023-09-25 ENCOUNTER — Telehealth: Payer: Self-pay | Admitting: Family

## 2023-09-25 NOTE — Telephone Encounter (Signed)
 Copied from CRM [id number]. Topic: General - Other >> Sep 24, 2023  5:04 PM Zebedee SAUNDERS wrote: Reason for CRM: Pt returning Franchot Purvis CROME, CMA call, please call pt at [phone].

## 2023-09-25 NOTE — Telephone Encounter (Signed)
 I made patient aware that the PCP stated she could not change what she put on the papw

## 2023-09-25 NOTE — Telephone Encounter (Signed)
 Copied from CRM [id number]. Topic: General - Other >> Sep 24, 2023  5:04 PM Zebedee SAUNDERS wrote: Reason for CRM: Pt returning Franchot Purvis CROME, CMA call, please call pt at [phone].

## 2023-09-25 NOTE — Telephone Encounter (Signed)
 I spoke with patient and made her aware that PCP stated she could not change what was on the paperwork.  She said she will talk to PCP on the day of her appointment and bring daughter with her

## 2023-09-26 NOTE — Telephone Encounter (Signed)
 I called and spoke with patient on 09/25/2023

## 2023-09-26 NOTE — Telephone Encounter (Signed)
 I spoke with patient on 09/25/2023

## 2023-10-05 ENCOUNTER — Ambulatory Visit: Admitting: Family

## 2023-10-08 ENCOUNTER — Encounter: Payer: Self-pay | Admitting: Family

## 2023-10-08 ENCOUNTER — Ambulatory Visit (INDEPENDENT_AMBULATORY_CARE_PROVIDER_SITE_OTHER): Admitting: Family

## 2023-10-08 ENCOUNTER — Ambulatory Visit: Admitting: Family

## 2023-10-08 VITALS — BP 119/77 | HR 82 | Temp 98.6°F | Resp 16 | Ht <= 58 in | Wt 150.4 lb

## 2023-10-08 DIAGNOSIS — R7303 Prediabetes: Secondary | ICD-10-CM

## 2023-10-08 DIAGNOSIS — I10 Essential (primary) hypertension: Secondary | ICD-10-CM

## 2023-10-08 DIAGNOSIS — M79671 Pain in right foot: Secondary | ICD-10-CM

## 2023-10-08 DIAGNOSIS — E559 Vitamin D deficiency, unspecified: Secondary | ICD-10-CM | POA: Diagnosis not present

## 2023-10-08 DIAGNOSIS — L84 Corns and callosities: Secondary | ICD-10-CM

## 2023-10-08 DIAGNOSIS — E785 Hyperlipidemia, unspecified: Secondary | ICD-10-CM

## 2023-10-08 MED ORDER — AMLODIPINE BESYLATE 5 MG PO TABS: 5.0000 mg | ORAL_TABLET | Freq: Every day | ORAL | 0 refills | Status: DC

## 2023-10-08 MED ORDER — DICLOFENAC SODIUM 1 % EX GEL
2.0000 g | Freq: Two times a day (BID) | CUTANEOUS | 2 refills | Status: AC
Start: 2023-10-08 — End: ?

## 2023-10-08 MED ORDER — ATORVASTATIN CALCIUM 20 MG PO TABS
20.0000 mg | ORAL_TABLET | Freq: Every day | ORAL | 0 refills | Status: AC
Start: 2023-10-08 — End: ?

## 2023-10-08 MED ORDER — LISINOPRIL 20 MG PO TABS
20.0000 mg | ORAL_TABLET | Freq: Every day | ORAL | 0 refills | Status: DC
Start: 2023-10-08 — End: 2023-10-29

## 2023-10-08 MED ORDER — BLOOD PRESSURE MONITOR DEVI
0 refills | Status: AC
Start: 2023-10-08 — End: ?

## 2023-10-08 NOTE — Progress Notes (Signed)
 3 month follow up, patient has pain in  the right foot in the morning time when she gets up

## 2023-10-08 NOTE — Progress Notes (Signed)
 Patient ID: April Randall, female    DOB: [birth year]  MRN: [id number]  CC: Chronic Conditions Follow-Up  Subjective: April Randall is a 73 y.o. female who presents for chronic conditions follow-up. She is accompanied by her daughter.  Her concerns today include:  - Doing well on Amlodipine  and Lisinopril , no issues/concerns. Needs blood pressure monitor. She does not complain of red flag symptoms such as but not limited to chest pain, shortness of breath, worst headache of life, nausea/vomiting.  - Prediabetes follow-up.  - Doing well on Atorvastatin , no issues/concerns.  - Vitamin D  follow-up.  - Intermittent right foot pain lasting for several minutes and callus. Denies recent trauma/injury and red flag symptoms. She would like to try a medication to see if this helps.  - I discussed with patient/patient's daughter that on a previous day I discussed with supervising physician Raguel Blush, MD patient/patient's daughter request for FMLA. Raguel Blush, MD advised that FMLA should be completed for 1 day every 3 months to allow for office visit to Primary Care and that the previous 4 days every 1 month is not currently recommended due to patient does not have 4 doctor's appointments every 1 month. It was discussed that if patient is established with specialists who determine that patient needs additional FMLA for patient's daughter they may be willing to consider to complete. It was also discussed that if patient needed transportation assistance patient may qualify for transportation to doctor's appointments if needed. I discussed with patient/patient's daughter I will leave current FMLA at 2 days every 1 month since this was recently completed prior to discussion with Raguel Blush, MD. Patient/patient's daughter verbalized understanding.   Patient Active Problem List   Diagnosis Date Noted   Prediabetes 05/11/2021   History of right knee joint replacement 01/29/2020   Hyperlipidemia  08/22/2019   Arthritis    Essential hypertension    S/P left unicompartmental knee replacement 12/04/2011   Arthritis of knee 08/30/2011     Current Outpatient Medications on File Prior to Visit  Medication Sig Dispense Refill   ASPIRIN  LOW DOSE 81 MG tablet TAKE 1 TABLET BY MOUTH EVERY DAY *SWALLOW WHOLE* 90 tablet 0   ASPIRIN  LOW DOSE 81 MG tablet TAKE 1 TABLET BY MOUTH EVERY 6 HOURS AS NEEDED. SWALLOW WHOLE. 90 tablet 0   cetirizine  (ZYRTEC ) 5 MG tablet TAKE 1 TABLET BY MOUTH EVERY DAY 90 tablet 0   No current facility-administered medications on file prior to visit.    No Known Allergies  Social History   Socioeconomic History   Marital status: Single    Spouse name: Not on file   Number of children: 4   Years of education: Not on file   Highest education level: Not on file  Occupational History   Not on file  Tobacco Use   Smoking status: Never    Passive exposure: Never   Smokeless tobacco: Never  Vaping Use   Vaping status: Never Used  Substance and Sexual Activity   Alcohol use: No    Alcohol/week: 0.0 standard drinks of alcohol   Drug use: No   Sexual activity: Not Currently    Birth control/protection: Post-menopausal  Other Topics Concern   Not on file  Social History Narrative   Right handed   [place]anch home   Social Drivers of Health   Financial Resource Strain: Low Risk  (03/14/2022)   Overall Financial Resource Strain (CARDIA)    Difficulty of Paying Living Expenses: Not hard at all  Food Insecurity: Low Risk  (05/23/2023)   Received from [facility]   Hunger Vital Sign    Within the past 12 months, you worried that your food would run out before you got money to buy more: Never true    Within the past 12 months, the food you bought just didn't last and you didn't have money to get more. : Never true  Transportation Needs: No Transportation Needs (05/23/2023)   Received from [facility]   Transportation    In the past 12 months, has lack of  reliable transportation kept you from medical appointments, meetings, work or from getting things needed for daily living? : No  Physical Activity: Inactive (03/14/2022)   Exercise Vital Sign    Days of Exercise per Week: 0 days    Minutes of Exercise per Session: 0 min  Stress: No Stress Concern Present (03/14/2022)   [employer] Occupational [employer] - Occupational Stress Questionnaire    Feeling of Stress : Not at all  Social Connections: Unknown (03/15/2022)   Received from [facility]   Social Network    Social Network: Not on file  Intimate Partner Violence: Unknown (03/15/2022)   Received from [facility]   HITS    Physically Hurt: Not on file    Insult or Talk Down To: Not on file    Threaten Physical Harm: Not on file    Scream or Curse: Not on file    Family History  Problem Relation Age of Onset   Hypertension Mother    Breast cancer Neg Hx    Colon cancer Neg Hx    Colon polyps Neg Hx    Esophageal cancer Neg Hx    Rectal cancer Neg Hx    Stomach cancer Neg Hx     Past Surgical History:  Procedure Laterality Date   COLONOSCOPY  05/29/2022   JOINT REPLACEMENT N/A    Phreesia 08/20/2019   REPLACEMENT TOTAL KNEE Right     ROS: Review of Systems Negative except as stated above  PHYSICAL EXAM: BP 119/77   Pulse 82   Temp 98.6 F (37 C) (Oral)   Resp 16   Ht 4' 9 (1.448 m)   Wt 150 lb 6.4 oz (68.2 kg)   SpO2 95%   BMI 32.55 kg/m   Physical Exam HENT:     Head: Normocephalic and atraumatic.     Nose: Nose normal.     Mouth/Throat:     Mouth: Mucous membranes are moist.     Pharynx: Oropharynx is clear.  Eyes:     Extraocular Movements: Extraocular movements intact.     Conjunctiva/sclera: Conjunctivae normal.     Pupils: Pupils are equal, round, and reactive to light.  Cardiovascular:     Rate and Rhythm: Normal rate and regular rhythm.     Pulses: Normal pulses.     Heart sounds: Normal heart sounds.  Pulmonary:     Effort:  Pulmonary effort is normal.     Breath sounds: Normal breath sounds.  Musculoskeletal:        General: Normal range of motion.     Cervical back: Normal range of motion and neck supple.     Right hip: Normal.     Left hip: Normal.     Right upper leg: Normal.     Left upper leg: Normal.     Right knee: Normal.     Left knee: Normal.     Right lower leg: Normal.  Left lower leg: Normal.     Right ankle: Normal.     Left ankle: Normal.     Right foot: Normal.     Left foot: Normal.  Neurological:     General: No focal deficit present.     Mental Status: She is alert and oriented to person, place, and time.  Psychiatric:        Mood and Affect: Mood normal.        Behavior: Behavior normal.     ASSESSMENT AND PLAN: 1. Primary hypertension (Primary) - Continue Amlodipine  and Lisinopril  as prescribed.  - Routine screening.  - Blood pressure monitor prescribed.  - Counseled on blood pressure goal of less than 130/80, low-sodium, DASH diet, medication compliance, and 150 minutes of moderate intensity exercise per week as tolerated. Counseled on medication adherence and adverse effects. - Follow-up with primary provider in 3 months or sooner if needed. - lisinopril  (ZESTRIL ) 20 MG tablet; Take 1 tablet (20 mg total) by mouth daily.  Dispense: 90 tablet; Refill: 0 - amLODipine  (NORVASC ) 5 MG tablet; Take 1 tablet (5 mg total) by mouth daily.  Dispense: 90 tablet; Refill: 0 - Basic Metabolic Panel - Blood Pressure Monitor DEVI; Use as directed to check home blood pressure 2-3 times a week  Dispense: 1 each; Refill: 0  2. Prediabetes - Routine screening.  - Hemoglobin A1c  3. Hyperlipidemia, unspecified hyperlipidemia type - Continue Atorvastatin  as prescribed. Counseled on medication adherence/adverse effects.  - Routine screening.  - Follow-up with primary provider as scheduled. - atorvastatin  (LIPITOR) 20 MG tablet; Take 1 tablet (20 mg total) by mouth daily.  Dispense: 90  tablet; Refill: 0 - Lipid panel  4. Vitamin D  deficiency - Routine screening.  - Vitamin D , 25-hydroxy  5. Right foot pain 6. Callus - Diclofenac  Sodium as prescribed. Counseled on medication adherence/adverse effects.  - Referral to Podiatry for evaluation/management.  - Follow-up with primary provider as scheduled. - diclofenac  Sodium (VOLTAREN ) 1 % GEL; Apply 2 g topically in the morning and at bedtime.  Dispense: 350 g; Refill: 2 - Ambulatory referral to Podiatry   Patient was given the opportunity to ask questions.  Patient verbalized understanding of the plan and was able to repeat key elements of the plan. Patient was given clear instructions to go to Emergency Department or return to medical center if symptoms don't improve, worsen, or new problems develop.The patient verbalized understanding.   Orders Placed This Encounter  Procedures   Vitamin D , 25-hydroxy   Basic Metabolic Panel   Lipid panel   Hemoglobin A1c   Ambulatory referral to Podiatry     Requested Prescriptions   Signed Prescriptions Disp Refills   diclofenac  Sodium (VOLTAREN ) 1 % GEL 350 g 2    Sig: Apply 2 g topically in the morning and at bedtime.   lisinopril  (ZESTRIL ) 20 MG tablet 90 tablet 0    Sig: Take 1 tablet (20 mg total) by mouth daily.   amLODipine  (NORVASC ) 5 MG tablet 90 tablet 0    Sig: Take 1 tablet (5 mg total) by mouth daily.   atorvastatin  (LIPITOR) 20 MG tablet 90 tablet 0    Sig: Take 1 tablet (20 mg total) by mouth daily.   Blood Pressure Monitor DEVI 1 each 0    Sig: Use as directed to check home blood pressure 2-3 times a week    Follow-up with primary provider as scheduled.  Greig JINNY Drones, NP

## 2023-10-09 ENCOUNTER — Ambulatory Visit: Payer: Self-pay | Admitting: Family

## 2023-10-09 LAB — LIPID PANEL
Chol/HDL Ratio: 2.4 ratio (ref 0.0–4.4)
Cholesterol, Total: 132 mg/dL (ref 100–199)
HDL: 55 mg/dL (ref >39)
LDL Chol Calc (NIH): 65 mg/dL (ref 0–99)
Triglycerides: 56 mg/dL (ref 0–149)
VLDL Cholesterol Cal: 12 mg/dL (ref 5–40)

## 2023-10-09 LAB — BASIC METABOLIC PANEL WITH GFR
BUN/Creatinine Ratio: 13 (ref 12–28)
BUN: 12 mg/dL (ref 8–27)
CO2: 19 mmol/L — ABNORMAL LOW (ref 20–29)
Calcium: 10.7 mg/dL — ABNORMAL HIGH (ref 8.7–10.3)
Chloride: 105 mmol/L (ref 96–106)
Creatinine, Ser: 0.9 mg/dL (ref 0.57–1.00)
Glucose: 105 mg/dL — ABNORMAL HIGH (ref 70–99)
Potassium: 3.9 mmol/L (ref 3.5–5.2)
Sodium: 139 mmol/L (ref 134–144)
eGFR: 68 mL/min/1.73 (ref >59)

## 2023-10-09 LAB — HEMOGLOBIN A1C
Est. average glucose Bld gHb Est-mCnc: 128 mg/dL
Hgb A1c MFr Bld: 6.1 % — ABNORMAL HIGH (ref 4.8–5.6)

## 2023-10-09 LAB — VITAMIN D 25 HYDROXY (VIT D DEFICIENCY, FRACTURES): Vit D, 25-Hydroxy: 42.3 ng/mL (ref 30.0–100.0)

## 2023-10-10 ENCOUNTER — Other Ambulatory Visit: Payer: Self-pay | Admitting: Family

## 2023-10-10 DIAGNOSIS — I10 Essential (primary) hypertension: Secondary | ICD-10-CM

## 2023-10-18 NOTE — Progress Notes (Signed)
 April Randall 73 y.o.  Reason for Visit: Right knee and ankle pain  HPI:  April Randall is a 73yo female who presents with a 6 month history of intermittent right knee and ankle pain. Her pain is worse first thing in the morning and improves by the end of the day. She localizes her pain to distal to the medial malleolus as well as the medial aspect of her right knee. She has been using diclofenac  gel which has not alleviated her pain. Reports swelling along the medial aspect of the knee. Denies feeling as though her legs are crooked.   Of note, she has a prior right medial unicompartmental arthroplasty on 11/15/2011 with Dr. Renford.  It has been over 4 years since we have seen her last.  Medical History[1]  Surgical History[2]  Social History   Socioeconomic History  . Marital status: Single    Spouse name: Not on file  . Number of children: Not on file  . Years of education: 12th grade  . Highest education level: Not on file  Occupational History  . Not on file  Tobacco Use  . Smoking status: Never    Passive exposure: Never  . Smokeless tobacco: Never  Vaping Use  . Vaping status: Never Used  Substance and Sexual Activity  . Alcohol use: No  . Drug use: No  . Sexual activity: Not on file  Other Topics Concern  . Not on file  Social History Narrative  . Not on file   Social Drivers of Health   Food Insecurity: Low Risk  (05/23/2023)   Food vital sign   . Within the past 12 months, you worried that your food would run out before you got money to buy more: Never true   . Within the past 12 months, the food you bought just didn't last and you didn't have money to get more: Never true  Transportation Needs: No Transportation Needs (05/23/2023)   Transportation   . In the past 12 months, has lack of reliable transportation kept you from medical appointments, meetings, work or from getting things needed for daily living? : No  Safety: Low Risk  (05/23/2023)   Safety   .  How often does anyone, including family and friends, physically hurt you?: Never   . How often does anyone, including family and friends, insult or talk down to you?: Never   . How often does anyone, including family and friends, threaten you with harm?: Never   . How often does anyone, including family and friends, scream or curse at you?: Never  Living Situation: Low Risk  (05/23/2023)   Living Situation   . What is your living situation today?: I have a steady place to live   . Think about the place you live. Do you have problems with any of the following? Choose all that apply:: None/None on this list    Family History[3]  Review of Systems A 14-point ROS was performed with pertinent positives/negatives noted in the HPI. The remainder of the ROS are negative.    Vitals:   10/18/23 1400  BP: 131/88    Physical Exam: Patient is alert, awake and oriented Normal and painless cervical spine range of motion  Exam Right Knee  Gait: Antalgic  Alignment: Valgus  Inspection: Well healed prior surgical incisions. No erythema or ecchymosis.  Effusion: 1+  ROM: 5-110  Crepitance:  Negative  Palpation: Medial patella: Negative Lateral patella:  Negative Plica:   Negative Fat Pad:  Negative Quad tendon:  Negative Patellar tendon:  Negative Medial Joint Line: Positive Lateral Joint Line:  Positive Popliteal Fossa: Negative Pes:  Negative MCL:  Negative PLC:  Negative  Stability:  ACL Lachman: Negative Anterior Drawer:  Negative PCL Posterior Drawer: Negative PLC Varus 0:  Negative Varus 30:  Negative MCL Valgus 0:  Negative Valgus 30:  Negative  Right Ankle: - no swelling or erythema, no tenderness to palpation, 5/5 strength, no numbness.   Imaging:  I have personally reviewed this patients bilateral knee XR demonstrating valgus alignment. Status post unicompartmental arthroplasty. Right knee with osteophytes along the lateral joint line. Subsidence of the  medial unicompartmental arthroplasty. XR of the right ankle demonstrating no acute fracture or malalignment. Mild osteoarthritis.   Assessment:  1. Bilateral chronic knee pain  XR Knee 1-2 Views Right   XR Knee 1-2 Views Left   Ambulatory referral to DME   CANCELED: Ambulatory referral to DME    2. Valgus deformity, not elsewhere classified, left knee      3. S/P left unicompartmental knee replacement      4. History of right knee joint replacement        Plan:  We discussed that her right knee is in valgus alignment with evidence of mild subsidence of the unicompartmental arthroplasty. Her ankle pain is likely related to her valgus angulation putting stress on the medial aspect of her ankle. We discussed conservative management versus surgical management including total knee arthroplasty. The patient would like to pursue non-operative management including voltaren  gel and a medial wedge insert to help with valgus angulation. She should follow up in 2 months to assess her progress. All questions were answered to her satisfaction.    Electronically signed by: Joesph Almarie Ruts, MD 10/18/2023 3:33 PM  I performed the service or was physically present during the critical or key portions of the service furnished by me; and he/she managed the patient.  Norleen KATHEE Canton, MD 10/19/2023        [1] Past Medical History: Diagnosis Date  . Hyperparathyroidism (CMD)   . Hypertension   . Osteoarthritis, knee    unspecified, bilateral   . Osteopenia   . Vertigo   [2] Past Surgical History: Procedure Laterality Date  . HYSTERECTOMY      Procedure: HYSTERECTOMY  . KNEE ARTHROPLASTY  11/15/2011   Procedure: MAKO - UNICOMPARTMENTAL KNEE REPLACEMENT - ROBOTIC;  Surgeon: Arley KANDICE Loupe, MD;  Location: [facility] MAIN OR;  Service: Orthopedics;  Laterality: Right;  lateral compartment  . KNEE ARTHROPLASTY Left 09/17/2017   Procedure: MAKO - UNICOMPARTMENTAL KNEE REPLACEMENT - ROBOTIC;  Surgeon:  Norleen Canton, MD;  Location: [facility] MAIN OR;  Service: Orthopedics;  Laterality: Left;  [3] Family History Problem Relation Name Age of Onset  . Hypertension Mother    . Anesthesia problems Neg Hx    . Asthma Neg Hx    . Cancer Neg Hx    . Arthritis Neg Hx    . Cerebral palsy Neg Hx    . Clotting disorder Neg Hx    . Club foot Neg Hx    . Collagen disease Neg Hx    . Deep vein thrombosis Neg Hx    . Diabetes Neg Hx    . Gait disorder Neg Hx    . Gout Neg Hx    . Heart disease Neg Hx    . Hip dysplasia Neg Hx    . Hip fracture Neg Hx    . Hypermobility  Neg Hx    . Osteoporosis Neg Hx    . Other Neg Hx    . Pulmonary disease Neg Hx    . Rheumatologic disease Neg Hx    . Scoliosis Neg Hx    . Spina bifida Neg Hx    . Thyroid disease Neg Hx    . Stroke Neg Hx    . Vasculitis Neg Hx

## 2023-10-23 ENCOUNTER — Ambulatory Visit (INDEPENDENT_AMBULATORY_CARE_PROVIDER_SITE_OTHER): Admitting: Podiatry

## 2023-10-23 ENCOUNTER — Encounter: Payer: Self-pay | Admitting: Podiatry

## 2023-10-23 ENCOUNTER — Ambulatory Visit (INDEPENDENT_AMBULATORY_CARE_PROVIDER_SITE_OTHER)

## 2023-10-23 DIAGNOSIS — M79672 Pain in left foot: Secondary | ICD-10-CM

## 2023-10-23 DIAGNOSIS — B351 Tinea unguium: Secondary | ICD-10-CM | POA: Diagnosis not present

## 2023-10-23 DIAGNOSIS — M25571 Pain in right ankle and joints of right foot: Secondary | ICD-10-CM | POA: Diagnosis not present

## 2023-10-23 DIAGNOSIS — M7989 Other specified soft tissue disorders: Secondary | ICD-10-CM

## 2023-10-23 DIAGNOSIS — M25572 Pain in left ankle and joints of left foot: Secondary | ICD-10-CM

## 2023-10-23 DIAGNOSIS — M79671 Pain in right foot: Secondary | ICD-10-CM

## 2023-10-23 DIAGNOSIS — M65971 Unspecified synovitis and tenosynovitis, right ankle and foot: Secondary | ICD-10-CM | POA: Diagnosis not present

## 2023-10-23 MED ORDER — PREDNISONE 5 MG PO TABS
ORAL_TABLET | ORAL | 0 refills | Status: AC
Start: 2023-10-23 — End: ?

## 2023-10-23 NOTE — Progress Notes (Signed)
 Patient presents complaining of painful thick nails 1 through 5 bilaterally.  Segment they have been getting worse over the past several years.  Hurt when walking and wearing shoes.  Also complains of pain along the medial ankle on the right.  Says it hurts when walking especially going down steps and first thing in the morning.  Has not noticed any redness or ecchymosis.  Does not recall any injury to the foot or ankle.   Physical exam:  General appearance: Pleasant, and in no acute distress. AOx3.  Vascular: Pedal pulses: DP 2/4 bilaterally, PT 2/4 bilaterally.  Mild to moderate edema lower legs bilaterally. Capillary fill time immediate bilateral.  Neurological: Light touch intact feet bilaterally.  Normal Achilles reflex bilaterally.  No clonus or spasticity noted.  Negative Tinel's sign tarsal tunnel and porta pedis bilaterally  Dermatologic:   Thick dystrophic discolored yellowing nails with subungual debris 1 through 5 bilaterally.  Tenderness on nail plates.  Skin normal temperature bilaterally.  Skin normal color, tone, and texture bilaterally.   Musculoskeletal: Tenderness on the medial aspect of the right ankle along the posterior tibial tendon.  Normal muscle strength lower extremity bilaterally.  Some tenderness in the sinus tarsi bilaterally.  Severe hallux valgus deformity with limitation of range of motion at the first metatarsal phalangeal joint bilaterally.  Radiographs:  3 views feet bilaterally: Severe hallux abductovalgus deformity with subluxation/dislocation of the first MTP laterally.  Severely increased intermetatarsal angle.  Severe joint space uneven narrowing at the first metatarsal phalangeal joint bilaterally.  Severe pes planus with decreased calcaneal inclination angle and increased talar calcaneal angles.  Midfoot midfoot arthrosis noted at the tarsal joints.  Severe pes planus with collapse at the talonavicular joint bilaterally  Diagnosis: 1. posterior  tibial tenosynovitis right 2.  Arthralgia subtalar joint and first MTP bilaterally 3.  Onychomycosis painful 1 through 5 bilaterally 4.  Pain feet bilaterally  Plan: -New patient visit office level 3 for evaluation and management.  Modifier 25. - Discussed with the posterior tibial tenosynovitis on the left.  Discussed etiology and treatment of this.  Also discussed the HAV deformities with hallux limitus deformities as well as the arthralgia in the subtalar joint bilaterally.  Strongly recommend wearing good supportive shoes with good cushioning. -RICE -Debridement onychomycotic nails 1 through 5 bilaterally   Return 2 weeks follow-up PTT pain right

## 2023-10-26 ENCOUNTER — Other Ambulatory Visit: Payer: Self-pay | Admitting: Family

## 2023-10-26 DIAGNOSIS — I10 Essential (primary) hypertension: Secondary | ICD-10-CM

## 2023-10-26 DIAGNOSIS — Z7901 Long term (current) use of anticoagulants: Secondary | ICD-10-CM

## 2023-10-26 NOTE — Telephone Encounter (Signed)
 Complete

## 2023-10-29 NOTE — Telephone Encounter (Signed)
 Complete

## 2023-10-31 ENCOUNTER — Telehealth: Payer: Self-pay | Admitting: Family

## 2023-10-31 NOTE — Telephone Encounter (Signed)
 Patient dropped off document Handicap Placard, to be filled out by provider. Patient requested to send it back via Call Patient to pick up within 7-days. Document is located in providers tray at front office.Please advise at Mobile [phone] (mobile)   Pt stated she would like to request for a permanent placard. Please advise

## 2023-10-31 NOTE — Telephone Encounter (Unsigned)
 Copied from CRM [id number]. Topic: General - Other >> Oct 31, 2023  2:27 PM Rosaria BRAVO wrote: Reason for CRM: Marcey Lee is going to bring the pt's handicap placard form to the office. He will bring photo ID to verify that he is dropping this off on the patient's behalf. She is unable to deliver it herself. He is on the way now.

## 2023-11-05 ENCOUNTER — Ambulatory Visit
Admission: RE | Admit: 2023-11-05 | Discharge: 2023-11-05 | Disposition: A | Discharge status: Home / Self Care | Source: Ambulatory Visit | Attending: Family | Admitting: Family

## 2023-11-05 DIAGNOSIS — Z1231 Encounter for screening mammogram for malignant neoplasm of breast: Secondary | ICD-10-CM

## 2023-11-06 NOTE — Telephone Encounter (Signed)
 I gave to PCP on 11/05/2023 to fill out

## 2023-11-07 ENCOUNTER — Ambulatory Visit: Payer: Self-pay | Admitting: Family

## 2023-11-08 ENCOUNTER — Ambulatory Visit (INDEPENDENT_AMBULATORY_CARE_PROVIDER_SITE_OTHER): Admitting: Family Medicine

## 2023-11-08 VITALS — BP 117/77 | HR 70 | Ht <= 58 in | Wt 151.4 lb

## 2023-11-08 DIAGNOSIS — I10 Essential (primary) hypertension: Secondary | ICD-10-CM | POA: Diagnosis not present

## 2023-11-08 NOTE — Progress Notes (Unsigned)
 Established Patient Office Visit  Subjective    Patient ID: April Randall, female    DOB: 08-20-1950  Age: 73 y.o. MRN: [id number]  CC:  Chief Complaint  Patient presents with   Medical Management of Chronic Issues    Pt is a pt of Amy's but wants a blood pressure cuff prescription     HPI April Randall presents for follow up of hypertension. Patient wants a cuff to check at home. She denies acute complaints.   Outpatient Encounter Medications as of 11/08/2023  Medication Sig   amLODipine  (NORVASC ) 5 MG tablet Take 1 tablet (5 mg total) by mouth daily.   ASPIRIN  LOW DOSE 81 MG tablet TAKE 1 TABLET BY MOUTH EVERY DAY *SWALLOW WHOLE*   ASPIRIN  LOW DOSE 81 MG tablet TAKE 1 TABLET BY MOUTH EVERY 6 HOURS AS NEEDED. SWALLOW WHOLE.   atorvastatin  (LIPITOR) 20 MG tablet Take 1 tablet (20 mg total) by mouth daily.   Blood Pressure Monitor DEVI Use as directed to check home blood pressure 2-3 times a week   cetirizine  (ZYRTEC ) 5 MG tablet TAKE 1 TABLET BY MOUTH EVERY DAY   diclofenac  Sodium (VOLTAREN ) 1 % GEL Apply 2 g topically in the morning and at bedtime.   lisinopril  (ZESTRIL ) 20 MG tablet TAKE 1 TABLET BY MOUTH DAILY   predniSONE  (DELTASONE ) 5 MG tablet 12 day taper: 6,6,5,5,4,4,3,3,2,2,1,1   No facility-administered encounter medications on file as of 11/08/2023.    Past Medical History:  Diagnosis Date   Arthritis    Hypertension     Past Surgical History:  Procedure Laterality Date   COLONOSCOPY  05/29/2022   JOINT REPLACEMENT N/A    Phreesia 08/20/2019   REPLACEMENT TOTAL KNEE Right     Family History  Problem Relation Age of Onset   Hypertension Mother    Breast cancer Neg Hx    Colon cancer Neg Hx    Colon polyps Neg Hx    Esophageal cancer Neg Hx    Rectal cancer Neg Hx    Stomach cancer Neg Hx    BRCA 1/2 Neg Hx     Social History   Socioeconomic History   Marital status: Single    Spouse name: Not on file   Number of children: 4   Years of  education: Not on file   Highest education level: Not on file  Occupational History   Not on file  Tobacco Use   Smoking status: Never    Passive exposure: Never   Smokeless tobacco: Never  Vaping Use   Vaping status: Never Used  Substance and Sexual Activity   Alcohol use: No    Alcohol/week: 0.0 standard drinks of alcohol   Drug use: No   Sexual activity: Not Currently    Birth control/protection: Post-menopausal  Other Topics Concern   Not on file  Social History Narrative   Right handed   [place] home   Social Drivers of Health   Financial Resource Strain: Low Risk  (03/14/2022)   Overall Financial Resource Strain (CARDIA)    Difficulty of Paying Living Expenses: Not hard at all  Food Insecurity: Low Risk  (05/23/2023)   Received from [facility]   Hunger Vital Sign    Within the past 12 months, you worried that your food would run out before you got money to buy more: Never true    Within the past 12 months, the food you bought just didn't last and you didn't have money to get more. :  Never true  Transportation Needs: No Transportation Needs (05/23/2023)   Received from [facility]    In the past 12 months, has lack of reliable transportation kept you from medical appointments, meetings, work or from getting things needed for daily living? : No  Physical Activity: Inactive (03/14/2022)   Exercise Vital Sign    Days of Exercise per Week: 0 days    Minutes of Exercise per Session: 0 min  Stress: No Stress Concern Present (03/14/2022)   [employer] of Occupational Health - Occupational Stress Questionnaire    Feeling of Stress : Not at all  Social Connections: Unknown (03/15/2022)   Received from [facility]   Social Network    Social Network: Not on file  Intimate Partner Violence: Unknown (03/15/2022)   Received from [facility]   HITS    Physically Hurt: Not on file    Insult or Talk Down To: Not on file    Threaten Physical Harm:  Not on file    Scream or Curse: Not on file    Review of Systems  All other systems reviewed and are negative.       Objective    BP 117/77   Pulse 70   Ht 4' 9 (1.448 m)   Wt 151 lb 6.4 oz (68.7 kg)   SpO2 96%   BMI 32.76 kg/m   Physical Exam Vitals and nursing note reviewed.  Constitutional:      General: She is not in acute distress. Cardiovascular:     Rate and Rhythm: Normal rate and regular rhythm.  Pulmonary:     Effort: Pulmonary effort is normal.     Breath sounds: Normal breath sounds.  Abdominal:     Palpations: Abdomen is soft.     Tenderness: There is no abdominal tenderness.  Neurological:     General: No focal deficit present.     Mental Status: She is alert and oriented to person, place, and time.         Assessment & Plan:   Essential hypertension   Appears stable. Patient encouraged to get homec uff and monitor.  Return if symptoms worsen or fail to improve.   Tanda Raguel SQUIBB, MD

## 2023-11-08 NOTE — Telephone Encounter (Signed)
 I called patient to let her know her paperwork is ready  for pick up at the front desk no one answered so I left a voicemail to return my call

## 2023-11-09 ENCOUNTER — Telehealth: Payer: Self-pay

## 2023-11-09 NOTE — Telephone Encounter (Signed)
 Copied from CRM [id number]. Topic: General - Other >> Nov 08, 2023  1:03 PM April Randall wrote: Reason for CRM: Patient is calling in returning a call from the office. Informed patient her paperwork was ready for pickup. Patient says she will pick it up tomorrow 11/09/23    Resolved: noted

## 2023-11-12 ENCOUNTER — Encounter: Payer: Self-pay | Admitting: Family Medicine

## 2023-11-29 ENCOUNTER — Ambulatory Visit (HOSPITAL_COMMUNITY)
Admission: EM | Admit: 2023-11-29 | Discharge: 2023-11-29 | Disposition: A | Discharge status: Home / Self Care | Attending: Internal Medicine | Admitting: Internal Medicine

## 2023-11-29 ENCOUNTER — Encounter (HOSPITAL_COMMUNITY): Payer: Self-pay | Admitting: Emergency Medicine

## 2023-11-29 DIAGNOSIS — J301 Allergic rhinitis due to pollen: Secondary | ICD-10-CM

## 2023-11-29 DIAGNOSIS — R0981 Nasal congestion: Secondary | ICD-10-CM | POA: Diagnosis not present

## 2023-11-29 DIAGNOSIS — M62830 Muscle spasm of back: Secondary | ICD-10-CM

## 2023-11-29 LAB — POC SARS CORONAVIRUS 2 AG -  ED: SARS Coronavirus 2 Ag: NEGATIVE

## 2023-11-29 MED ORDER — LORATADINE 10 MG PO TABS: 10.0000 mg | ORAL_TABLET | Freq: Every day | ORAL | 0 refills | Status: AC

## 2023-11-29 MED ORDER — FLUTICASONE PROPIONATE 50 MCG/ACT NA SUSP: 1.0000 | Freq: Every day | NASAL | 0 refills | Status: AC

## 2023-11-29 MED ORDER — ACETAMINOPHEN 325 MG PO TABS
650.0000 mg | ORAL_TABLET | Freq: Once | ORAL | Status: AC
  Administered 2023-11-29: 650 mg via ORAL

## 2023-11-29 MED ORDER — BACLOFEN 5 MG PO TABS: 5.0000 mg | ORAL_TABLET | Freq: Two times a day (BID) | ORAL | 0 refills | Status: AC | PRN

## 2023-11-29 MED ORDER — ACETAMINOPHEN 325 MG PO TABS
ORAL_TABLET | ORAL | Status: AC
  Filled 2023-11-29: qty 2

## 2023-11-29 NOTE — ED Triage Notes (Signed)
 Pt reports that she has had right sided neck pain and headache that is relieved with Tylenol  but will come back when medication wears off. Last dose was this morning.

## 2023-11-29 NOTE — Discharge Instructions (Addendum)
 COVID testing is negative.  Your symptoms are likely due to a pulled muscle in the neck and seasonal allergies.  Pulled muscles typically heal on their own in several days. You may take tylenol  as needed for aches and pains. Take muscle relaxer as needed for muscle spasm, mostly take this at bedtime as this medicine can cause drowsiness. Apply heat to the pulled muscle 20 minutes on 20 minutes off as needed, heat relaxes muscles. Perform gentle exercises and stretches to area of tenderness. I would like for you to rest, however I do not want you to avoid moving the area. Movement and stretching will help with healing.  Your symptoms are likely due to environmental allergies.   Take Claritin  daily.  Use 1 puff of Flonase  daily into the nose.  These medications can take a few days to fully kick in to your body and start working.  If your eyes become itchy, you may purchase olopatadine (Pataday) eyedrops over the counter and use as directed to relieve watery/itchy eyes associated with allergies as well.   Schedule an appointment with your primary care provider for follow-up and further management of your seasonal allergies as well as ongoing preventive healthcare.

## 2023-11-29 NOTE — ED Provider Notes (Signed)
 [facility]    CSN: [id number] Arrival date & time: 11/29/23  1358      History   Chief Complaint Chief Complaint  Patient presents with   Neck Pain   Headache    HPI April Randall is a 73 y.o. female.   April Randall is a 73 y.o. female presenting for chief complaint of Neck Pain and Headache that started this morning upon waking.  Pain starts to the right upper side of the neck at the base of the skull and radiates down into the right trapezius muscle/right sided cervical paraspinous musculature.  Pain is worsened with head movement with lateral flexion, improves with rest and Tylenol .  She has taken Tylenol  500 mg at home this morning 6 hours ago which completely took away the pain.  Now has now worn off and pain is a 7 on a scale of 0-10.  Pain is described as tight and achy.  Denies recent trauma/injuries to the neck or head, dizziness, ear pain, fever, chills, body aches, and nausea/vomiting.  No history of migraines.  Pain does not cross midline of the head.  Denies visual disturbances, paresthesias to the extremities, unilateral extremity weakness, and midline neck pain.  She additionally reports wheezing and rhinorrhea/congestion that started 3 days ago.  No recent sick contacts with similar symptoms.  Denies cough, fever/chills, shortness of breath, chest pain, palpitations, leg swelling, and orthopnea.  Denies watery/itchy eyes and nosebleeds. Denies history of seasonal allergies. She has not attempted use of any over-the-counter medications to help with sneezing/rhinorrhea at home.   Neck Pain Associated symptoms: headaches   Headache Associated symptoms: neck pain     Past Medical History:  Diagnosis Date   Arthritis    Hypertension     Patient Active Problem List   Diagnosis Date Noted   Prediabetes 05/11/2021   History of right knee joint replacement 01/29/2020   Hyperlipidemia 08/22/2019   Arthritis    Essential hypertension    S/P left  unicompartmental knee replacement 12/04/2011   Arthritis of knee 08/30/2011    Past Surgical History:  Procedure Laterality Date   COLONOSCOPY  05/29/2022   JOINT REPLACEMENT N/A    Phreesia 08/20/2019   REPLACEMENT TOTAL KNEE Right     OB History   No obstetric history on file.      Home Medications    Prior to Admission medications   Medication Sig Start Date End Date Taking? Authorizing Provider  Baclofen  5 MG TABS Take 1 tablet (5 mg total) by mouth every 12 (twelve) hours as needed. 11/29/23  Yes Enedelia Dorna HERO, FNP  fluticasone  (FLONASE ) 50 MCG/ACT nasal spray Place 1 spray into both nostrils daily. 11/29/23  Yes Enedelia Dorna HERO, FNP  loratadine  (CLARITIN ) 10 MG tablet Take 1 tablet (10 mg total) by mouth daily. 11/29/23  Yes Enedelia Dorna HERO, FNP  amLODipine  (NORVASC ) 5 MG tablet Take 1 tablet (5 mg total) by mouth daily. 10/08/23   Lorren Greig PARAS, NP  ASPIRIN  LOW DOSE 81 MG tablet TAKE 1 TABLET BY MOUTH EVERY DAY *SWALLOW WHOLE* 01/31/23   Lorren Greig PARAS, NP  ASPIRIN  LOW DOSE 81 MG tablet TAKE 1 TABLET BY MOUTH EVERY 6 HOURS AS NEEDED. SWALLOW WHOLE. 10/26/23   Lorren Greig PARAS, NP  atorvastatin  (LIPITOR) 20 MG tablet Take 1 tablet (20 mg total) by mouth daily. 10/08/23   Lorren Greig PARAS, NP  Blood Pressure Monitor DEVI Use as directed to check home blood pressure 2-3 times a  week 10/08/23   Lorren Greig PARAS, NP  diclofenac  Sodium (VOLTAREN ) 1 % GEL Apply 2 g topically in the morning and at bedtime. 10/08/23   Lorren Greig PARAS, NP  lisinopril  (ZESTRIL ) 20 MG tablet TAKE 1 TABLET BY MOUTH DAILY 10/29/23   Lorren Greig PARAS, NP  predniSONE  (DELTASONE ) 5 MG tablet 12 day taper: 6,6,5,5,4,4,3,3,2,2,1,1 10/23/23   Christine Rush, DPM    Family History Family History  Problem Relation Age of Onset   Hypertension Mother    Breast cancer Neg Hx    Colon cancer Neg Hx    Colon polyps Neg Hx    Esophageal cancer Neg Hx    Rectal cancer Neg Hx    Stomach cancer Neg Hx    BRCA  1/2 Neg Hx     Social History Social History   Tobacco Use   Smoking status: Never    Passive exposure: Never   Smokeless tobacco: Never  Vaping Use   Vaping status: Never Used  Substance Use Topics   Alcohol use: No    Alcohol/week: 0.0 standard drinks of alcohol   Drug use: No     Allergies   Patient has no known allergies.   Review of Systems Review of Systems  Musculoskeletal:  Positive for neck pain.  Neurological:  Positive for headaches.  Per HPI   Physical Exam Triage Vital Signs ED Triage Vitals  Encounter Vitals Group     BP 11/29/23 1502 (!) 147/88     Girls Systolic BP Percentile --      Girls Diastolic BP Percentile --      Boys Systolic BP Percentile --      Boys Diastolic BP Percentile --      Pulse Rate 11/29/23 1502 64     Resp 11/29/23 1502 17     Temp 11/29/23 1502 98.4 F (36.9 C)     Temp Source 11/29/23 1502 Oral     SpO2 11/29/23 1502 96 %     Weight --      Height --      Head Circumference --      Peak Flow --      Pain Score 11/29/23 1501 7     Pain Loc --      Pain Education --      Exclude from Growth Chart --    No data found.  Updated Vital Signs BP (!) 147/88 (BP Location: Left Arm)   Pulse 64   Temp 98.4 F (36.9 C) (Oral)   Resp 17   SpO2 96%   Visual Acuity Right Eye Distance:   Left Eye Distance:   Bilateral Distance:    Right Eye Near:   Left Eye Near:    Bilateral Near:     Physical Exam Vitals and nursing note reviewed.  Constitutional:      Appearance: She is not ill-appearing or toxic-appearing.  HENT:     Head: Normocephalic and atraumatic.     Right Ear: Hearing, tympanic membrane, ear canal and external ear normal.     Left Ear: Hearing, tympanic membrane, ear canal and external ear normal.     Nose: Congestion present. No rhinorrhea.     Right Nostril: No occlusion.     Left Nostril: No occlusion.     Right Turbinates: Swollen.     Left Turbinates: Swollen.     Mouth/Throat:     Lips:  Pink.     Mouth: Mucous membranes are moist. No injury or  oral lesions.     Dentition: Normal dentition.     Tongue: No lesions.     Pharynx: Oropharynx is clear. Uvula midline. No pharyngeal swelling, oropharyngeal exudate, posterior oropharyngeal erythema, uvula swelling or postnasal drip.     Tonsils: No tonsillar exudate.  Eyes:     General: Lids are normal. Vision grossly intact. Gaze aligned appropriately.     Extraocular Movements: Extraocular movements intact.     Conjunctiva/sclera: Conjunctivae normal.  Neck:     Trachea: Trachea and phonation normal.     Comments: 5/5 strength against resistance with lateral flexion of the the neck.  Normal sensation bilaterally to the face. Cardiovascular:     Rate and Rhythm: Normal rate and regular rhythm.     Heart sounds: Normal heart sounds, S1 normal and S2 normal.  Pulmonary:     Effort: Pulmonary effort is normal. No respiratory distress.     Breath sounds: Normal breath sounds and air entry.  Musculoskeletal:     Cervical back: Normal range of motion and neck supple. No signs of trauma, rigidity, torticollis or crepitus. Pain with movement and muscular tenderness (Tender to multiple points of palpation over the right trapezius muscle/right cervical paraspinous musculature.) present. No spinous process tenderness. Normal range of motion (Full ROM of neck).  Lymphadenopathy:     Cervical: No cervical adenopathy.  Skin:    General: Skin is warm and dry.     Capillary Refill: Capillary refill takes less than 2 seconds.     Findings: No rash.  Neurological:     General: No focal deficit present.     Mental Status: She is alert and oriented to person, place, and time. Mental status is at baseline.     GCS: GCS eye subscore is 4. GCS verbal subscore is 5. GCS motor subscore is 6.     Cranial Nerves: Cranial nerves 2-12 are intact. No dysarthria or facial asymmetry.     Sensory: Sensation is intact.     Motor: Motor function is intact.  No weakness, tremor, abnormal muscle tone or pronator drift.     Coordination: Coordination is intact. Romberg sign negative. Coordination normal. Finger-Nose-Finger Test normal.     Gait: Gait is intact.     Comments: Strength and sensation intact to bilateral upper and lower extremities (5/5). Moves all 4 extremities with normal coordination voluntarily. Non-focal neuro exam.   Psychiatric:        Mood and Affect: Mood normal.        Speech: Speech normal.        Behavior: Behavior normal.        Thought Content: Thought content normal.        Judgment: Judgment normal.      UC Treatments / Results  Labs (all labs ordered are listed, but only abnormal results are displayed) Labs Reviewed  POC SARS CORONAVIRUS 2 AG -  ED    EKG   Radiology No results found.  Procedures Procedures (including critical care time)  Medications Ordered in UC Medications  acetaminophen  (TYLENOL ) tablet 650 mg (650 mg Oral Given 11/29/23 1544)    Initial Impression / Assessment and Plan / UC Course  I have reviewed the triage vital signs and the nursing notes.  Pertinent labs & imaging results that were available during my care of the patient were reviewed by me and considered in my medical decision making (see chart for details).   1.  Congestion of nasal sinus, seasonal allergic rhinitis due  to pollen COVID-19 testing is negative, low suspicion for viral URI etiology of symptoms.  Suspect allergic rhinitis.  Claritin  and flonase  prescribed. Renal function intact per recent BMP on October 08, 2023. Per up-to-date, patient can have normal dosing of Claritin  based on renal function. Follow-up with PCP and avoid allergens.  2.  Spasm of right trapezius muscle Neck pain and headache secondary to muscle spasm of the right trapezius muscle. Heat and gentle range of motion exercises recommended. Tylenol  given in clinic with significant improvement in pain prior to discharge. Deferred imaging based  on atraumatic presentation/mechanism of pain, neurologically intact baseline, no midline tenderness to the neck. She may follow-up with PCP as needed for ongoing management.   Counseled patient on potential for adverse effects with medications prescribed/recommended today, strict ER and return-to-clinic precautions discussed, patient verbalized understanding.    Final Clinical Impressions(s) / UC Diagnoses   Final diagnoses:  Congestion of nasal sinus  Seasonal allergic rhinitis due to pollen  Spasm of right trapezius muscle     Discharge Instructions      COVID testing is negative.  Your symptoms are likely due to a pulled muscle in the neck and seasonal allergies.  Pulled muscles typically heal on their own in several days. You may take tylenol  as needed for aches and pains. Take muscle relaxer as needed for muscle spasm, mostly take this at bedtime as this medicine can cause drowsiness. Apply heat to the pulled muscle 20 minutes on 20 minutes off as needed, heat relaxes muscles. Perform gentle exercises and stretches to area of tenderness. I would like for you to rest, however I do not want you to avoid moving the area. Movement and stretching will help with healing.  Your symptoms are likely due to environmental allergies.   Take Claritin  daily.  Use 1 puff of Flonase  daily into the nose.  These medications can take a few days to fully kick in to your body and start working.  If your eyes become itchy, you may purchase olopatadine (Pataday) eyedrops over the counter and use as directed to relieve watery/itchy eyes associated with allergies as well.   Schedule an appointment with your primary care provider for follow-up and further management of your seasonal allergies as well as ongoing preventive healthcare.      ED Prescriptions     Medication Sig Dispense Auth. Provider   loratadine  (CLARITIN ) 10 MG tablet Take 1 tablet (10 mg total) by mouth daily. 30 tablet  April Cude M, FNP   fluticasone  (FLONASE ) 50 MCG/ACT nasal spray Place 1 spray into both nostrils daily. 16 g Enedelia Going M, FNP   Baclofen  5 MG TABS Take 1 tablet (5 mg total) by mouth every 12 (twelve) hours as needed. 20 tablet Enedelia Going HERO, FNP      PDMP not reviewed this encounter.   Enedelia Going HERO, [place] 11/29/23 [id number]

## 2023-12-02 ENCOUNTER — Other Ambulatory Visit: Payer: Self-pay | Admitting: Family

## 2023-12-02 DIAGNOSIS — I10 Essential (primary) hypertension: Secondary | ICD-10-CM

## 2023-12-04 NOTE — Telephone Encounter (Signed)
 Too soon for refill, [facility] 10/08/23 for 90 days.  Requested Prescriptions  Pending Prescriptions Disp Refills   amLODipine  (NORVASC ) 5 MG tablet [Pharmacy Med Name: amLODIPine  Besylate 5 MG Oral Tablet] 100 tablet 2    Sig: TAKE 1 TABLET BY MOUTH DAILY     Cardiovascular: Calcium  Channel Blockers 2 Failed - 12/04/2023  9:20 AM      Failed - Last BP in normal range    BP Readings from Last 1 Encounters:  11/29/23 (!) 147/88         Passed - Last Heart Rate in normal range    Pulse Readings from Last 1 Encounters:  11/29/23 64         Passed - Valid encounter within last 6 months    Recent Outpatient Visits           3 weeks ago Essential hypertension   [facility], MD   1 month ago Primary hypertension   [place] [facility], [place], NP   2 months ago Encounter for completion of form with patient   [facility], Amy J, NP   4 months ago Primary hypertension   [place] [facility], Amy J, NP   5 months ago Encounter for routine dental examination   [facility], Greig PARAS, NP

## 2023-12-13 ENCOUNTER — Ambulatory Visit (INDEPENDENT_AMBULATORY_CARE_PROVIDER_SITE_OTHER): Admitting: Physician Assistant

## 2023-12-13 ENCOUNTER — Encounter: Payer: Self-pay | Admitting: Physician Assistant

## 2023-12-13 VITALS — BP 139/88 | HR 104 | Resp 18 | Wt 155.0 lb

## 2023-12-13 DIAGNOSIS — G3184 Mild cognitive impairment, so stated: Secondary | ICD-10-CM | POA: Diagnosis not present

## 2023-12-13 MED ORDER — DONEPEZIL HCL 5 MG PO TABS: 5.0000 mg | ORAL_TABLET | Freq: Every day | ORAL | 3 refills | Status: AC

## 2023-12-13 NOTE — Progress Notes (Addendum)
 Assessment/Plan:    Mild cognitive impairment likely of multiple etiologies  April Randall is a very pleasant 73 y.o. RH female with a history of hypertension, hyperlipidemia, arthritis, prediabetes,  hyperparathyroidism, chronic maxillary sinusitis followed by ENT, history of hypercalcemia, vitamin D  deficiency presenting today in follow-up for evaluation of memory loss.  Her memory is fairly stable, with MMSE today of 29/30.  However, given her history of prior stroke and risk factors mentioned above, it is felt prudent to try a low-dose of donepezil  5 mg daily, to which the patient agrees.  Mood is good.  She is able to participate in her ADLs without difficulty.   Recommendations:   Follow up in 6 months. Start Donepezil  5mg  daily. Side effects discussed   Recommend good control of cardiovascular risk factors Follow-up maxillary sinusitis with ENT Continue to control mood as per PCP    Subjective:   This patient is here alone. Previous records as well as any outside records available were reviewed prior to todays visit.   Patient was last seen on 08/22/23 with MoCA of 22/30  .    Any changes in memory since last visit? About the same. She is able to remember recent conversations and names of people  She writes items on her to do list but still will forget.  Long-term memory is good. repeats oneself?  Endorsed Disoriented when walking into a room?  Patient denies    Misplacing objects?  Patient denies    Wandering behavior?   Denies. Any personality changes since last visit? Denies.   Any worsening depression?: denies.   Hallucinations or paranoia?  Denies.   Seizures?   Denies.    Any sleep changes? Sleeps well. Denies vivid dreams, REM behavior or sleepwalking   Sleep apnea?   denies    Any hygiene concerns?   Denies.   Independent of bathing and dressing?  Endorsed  Does the patient needs help with medications? Patient is in charge   Who is in charge of the finances?   Patient is in charge     Any changes in appetite?  Denies.     Patient have trouble swallowing?  Denies.   Does the patient cook? Yes, denies forgetting recipes  Any kitchen accidents such as leaving the stove on?   Denies.   Any headaches?    She reports sinus headaches, with a recent event on September 7 requiring evaluation at urgent care.  At that time she also had muscle spasm to the right trapezius. Vision changes? Denies. Chronic pain?  She has chronic bilateral knee pain due to arthritis Ambulates with difficulty? Tries to walk but not too much.    Recent falls or head injuries?    Denies.      Unilateral weakness, numbness or tingling?  Denies.   Any tremors?  Denies.   Any anosmia?    Denies.   Any incontinence of urine?  Denies.   Any bowel dysfunction?  Intermittent constipation.      Patient lives with her daughter.  Does the patient drive?  Never drove  Past Medical History:  Diagnosis Date   Arthritis    Hypertension      Past Surgical History:  Procedure Laterality Date   COLONOSCOPY  05/29/2022   JOINT REPLACEMENT N/A    [employer] 08/20/2019   REPLACEMENT TOTAL KNEE Right      PREVIOUS MEDICATIONS:   CURRENT MEDICATIONS:  Outpatient Encounter Medications as of 12/13/2023  Medication Sig   amLODipine  (  NORVASC ) 5 MG tablet Take 1 tablet (5 mg total) by mouth daily.   ASPIRIN  LOW DOSE 81 MG tablet TAKE 1 TABLET BY MOUTH EVERY DAY *SWALLOW WHOLE*   ASPIRIN  LOW DOSE 81 MG tablet TAKE 1 TABLET BY MOUTH EVERY 6 HOURS AS NEEDED. SWALLOW WHOLE.   atorvastatin  (LIPITOR) 20 MG tablet Take 1 tablet (20 mg total) by mouth daily.   Baclofen  5 MG TABS Take 1 tablet (5 mg total) by mouth every 12 (twelve) hours as needed.   Blood Pressure Monitor DEVI Use as directed to check home blood pressure 2-3 times a week   diclofenac  Sodium (VOLTAREN ) 1 % GEL Apply 2 g topically in the morning and at bedtime.   donepezil  (ARICEPT ) 5 MG tablet Take 1 tablet (5 mg total) by mouth  daily.   fluticasone  (FLONASE ) 50 MCG/ACT nasal spray Place 1 spray into both nostrils daily.   lisinopril  (ZESTRIL ) 20 MG tablet TAKE 1 TABLET BY MOUTH DAILY   loratadine  (CLARITIN ) 10 MG tablet Take 1 tablet (10 mg total) by mouth daily.   predniSONE  (DELTASONE ) 5 MG tablet 12 day taper: 6,6,5,5,4,4,3,3,2,2,1,1 (Patient not taking: Reported on 12/13/2023)   No facility-administered encounter medications on file as of 12/13/2023.     Objective:     PHYSICAL EXAMINATION:    VITALS:   Vitals:   12/13/23 0931 12/13/23 0932  BP: (!) 144/89 139/88  Pulse: (!) 104   Resp: 18   SpO2: 95%   Weight: 155 lb (70.3 kg)     GEN:  The patient appears stated age and is in NAD. HEENT:  Normocephalic, atraumatic.   Neurological examination:  General: NAD, well-groomed, appears stated age. Orientation: The patient is alert. Oriented to person, place and to date.  Cranial nerves: There is good facial symmetry.The speech is fluent and clear. No aphasia or dysarthria. Fund of knowledge is appropriate. Recent memory impaired and remote memory is normal.  Attention and concentration are normal.  Able to name objects and repeat phrases.  Hearing is intact to conversational tone.   Delayed recall 3/3 Sensation: Sensation is intact to light touch throughout Motor: Strength is at least antigravity x4. DTR's 2/4 in UE/LE      08/22/2023   10:00 AM  Montreal Cognitive Assessment   Visuospatial/ Executive (0/5) 1  Naming (0/3) 2  Attention: Read list of digits (0/2) 1  Attention: Read list of letters (0/1) 1  Attention: Serial 7 subtraction starting at 100 (0/3) 3  Language: Repeat phrase (0/2) 1  Language : Fluency (0/1) 1  Abstraction (0/2) 2  Delayed Recall (0/5) 3  Orientation (0/6) 6  Total 21  Adjusted Score (based on education) 22       12/13/2023   11:00 AM 06/09/2020    2:26 PM  MMSE - Mini Mental State Exam  Orientation to time 4 5  Orientation to Place 5 5  Registration 3 3   Attention/ Calculation 5 5  Recall 3 3  Language- name 2 objects 2 2  Language- repeat 1 1  Language- follow 3 step command 3 3  Language- read & follow direction 1 1  Write a sentence 1 1  Copy design 1 0  Total score 29 29       Movement examination: Tone: There is normal tone in the UE/LE Abnormal movements:  no tremor.  No myoclonus.  No asterixis.   Coordination:  There is no decremation with RAM's. Normal finger to nose  Gait and Station:  The patient has no difficulty arising out of a deep-seated chair without the use of the hands. The patient's stride length is good.  Gait is cautious and narrow.   Thank you for allowing us  the opportunity to participate in the care of this nice patient. Please do not hesitate to contact us  for any questions or concerns.   Total time spent on today's visit was 30 minutes dedicated to this patient today, preparing to see patient, examining the patient, ordering tests and/or medications and counseling the patient, documenting clinical information in the EHR or other health record, independently interpreting results and communicating results to the patient/family, discussing treatment and goals, answering patient's questions and coordinating care.  Cc:  Lorren Greig PARAS, NP  Camie Sevin 12/13/2023 11:59 AM

## 2023-12-13 NOTE — Patient Instructions (Signed)
 It was a pleasure to see you today at our office.   Recommendations:     Follow up in 6 months We will start donepezil   5 mg daily.  Continue taking your baby aspirin  Monitor your blood pressure Follow sinusitis with ENT        [email or web address]   RECOMMENDATIONS FOR ALL PATIENTS WITH MEMORY PROBLEMS: 1. Continue to exercise (Recommend 30 minutes of walking everyday, or 3 hours every week) 2. Increase social interactions - continue going to [place] and enjoy social gatherings with friends and family 3. Eat healthy, avoid fried foods and eat more fruits and vegetables 4. Maintain adequate blood pressure, blood sugar, and blood cholesterol level. Reducing the risk of stroke and cardiovascular disease also helps promoting better memory. 5. Avoid stressful situations. Live a simple life and avoid aggravations. Organize your time and prepare for the next day in anticipation. 6. Sleep well, avoid any interruptions of sleep and avoid any distractions in the bedroom that may interfere with adequate sleep quality 7. Avoid sugar, avoid sweets as there is a strong link between excessive sugar intake, diabetes, and cognitive impairment We discussed the Mediterranean diet, which has been shown to help patients reduce the risk of progressive memory disorders and reduces cardiovascular risk. This includes eating fish, eat fruits and green leafy vegetables, nuts like almonds and hazelnuts, walnuts, and also use olive oil. Avoid fast foods and fried foods as much as possible. Avoid sweets and sugar as sugar use has been linked to worsening of memory function.  There is always a concern of gradual progression of memory problems. If this is the case, then we may need to adjust level of care according to patient needs. Support, both to the patient and caregiver, should then be put into place.       FALL PRECAUTIONS: Be cautious when walking. Scan the  area for obstacles that may increase the risk of trips and falls. When getting up in the mornings, sit up at the edge of the bed for a few minutes before getting out of bed. Consider elevating the bed at the head end to avoid drop of blood pressure when getting up. Walk always in a well-lit room (use night lights in the walls). Avoid area rugs or power cords from appliances in the middle of the walkways. Use a walker or a cane if necessary and consider physical therapy for balance exercise. Get your eyesight checked regularly.  FINANCIAL OVERSIGHT: Supervision, especially oversight when making financial decisions or transactions is also recommended.  HOME SAFETY: Consider the safety of the kitchen when operating appliances like stoves, microwave oven, and blender. Consider having supervision and share cooking responsibilities until no longer able to participate in those. Accidents with firearms and other hazards in the house should be identified and addressed as well.   ABILITY TO BE LEFT ALONE: If patient is unable to contact 911 operator, consider using LifeLine, or when the need is there, arrange for someone to stay with patients. Smoking is a fire hazard, consider supervision or cessation. Risk of wandering should be assessed by caregiver and if detected at any point, supervision and safe proof recommendations should be instituted.  MEDICATION SUPERVISION: Inability to self-administer medication needs to be constantly addressed. Implement a mechanism to ensure safe administration of the medications.      Mediterranean Diet A Mediterranean diet refers to food and lifestyle choices that are based on the traditions of countries located on the [employer]. This way of eating has  been shown to help prevent certain conditions and improve outcomes for people who have chronic diseases, like kidney disease and heart disease. What are tips for following this plan? Lifestyle  Cook and eat meals  together with your family, when possible. Drink enough fluid to keep your urine clear or pale yellow. Be physically active every day. This includes: Aerobic exercise like running or swimming. Leisure activities like gardening, walking, or housework. Get 7-8 hours of sleep each night. If recommended by your health care provider, drink red wine in moderation. This means 1 glass a day for nonpregnant women and 2 glasses a day for men. A glass of wine equals 5 oz (150 mL). Reading food labels  Check the serving size of packaged foods. For foods such as rice and pasta, the serving size refers to the amount of cooked product, not dry. Check the total fat in packaged foods. Avoid foods that have saturated fat or trans fats. Check the ingredients list for added sugars, such as corn syrup. Shopping  At the grocery store, buy most of your food from the areas near the walls of the store. This includes: Fresh fruits and vegetables (produce). Grains, beans, nuts, and seeds. Some of these may be available in unpackaged forms or large amounts (in bulk). Fresh seafood. Poultry and eggs. Low-fat dairy products. Buy whole ingredients instead of prepackaged foods. Buy fresh fruits and vegetables in-season from local farmers markets. Buy frozen fruits and vegetables in resealable bags. If you do not have access to quality fresh seafood, buy precooked frozen shrimp or canned fish, such as tuna, salmon, or sardines. Buy small amounts of raw or cooked vegetables, salads, or olives from the deli or salad bar at your store. Stock your pantry so you always have certain foods on hand, such as olive oil, canned tuna, canned tomatoes, rice, pasta, and beans. Cooking  Cook foods with extra-virgin olive oil instead of using butter or other vegetable oils. Have meat as a side dish, and have vegetables or grains as your main dish. This means having meat in small portions or adding small amounts of meat to foods like pasta  or stew. Use beans or vegetables instead of meat in common dishes like chili or lasagna. Experiment with different cooking methods. Try roasting or broiling vegetables instead of steaming or sauteing them. Add frozen vegetables to soups, stews, pasta, or rice. Add nuts or seeds for added healthy fat at each meal. You can add these to yogurt, salads, or vegetable dishes. Marinate fish or vegetables using olive oil, lemon juice, garlic, and fresh herbs. Meal planning  Plan to eat 1 vegetarian meal one day each week. Try to work up to 2 vegetarian meals, if possible. Eat seafood 2 or more times a week. Have healthy snacks readily available, such as: Vegetable sticks with hummus. Greek yogurt. Fruit and nut trail mix. Eat balanced meals throughout the week. This includes: Fruit: 2-3 servings a day Vegetables: 4-5 servings a day Low-fat dairy: 2 servings a day Fish, poultry, or lean meat: 1 serving a day Beans and legumes: 2 or more servings a week Nuts and seeds: 1-2 servings a day Whole grains: 6-8 servings a day Extra-virgin olive oil: 3-4 servings a day Limit red meat and sweets to only a few servings a month What are my food choices? Mediterranean diet Recommended Grains: Whole-grain pasta. Brown rice. Bulgar wheat. Polenta. Couscous. Whole-wheat bread. [place]. Vegetables: Artichokes. Beets. Broccoli. Cabbage. Carrots. Eggplant. Green beans. Chard. Kale. Spinach. Onions.  Leeks. Peas. Squash. Tomatoes. Peppers. Radishes. Fruits: Apples. Apricots. Avocado. Berries. Bananas. Cherries. Dates. Figs. Grapes. Lemons. Melon. Oranges. Peaches. Plums. Pomegranate. Meats and other protein foods: Beans. Almonds. Sunflower seeds. Pine nuts. Peanuts. Cod. Salmon. Scallops. Shrimp. Tuna. Tilapia. Clams. Oysters. Eggs. Dairy: Low-fat milk. Cheese. Greek yogurt. Beverages: Water. Red wine. Herbal tea. Fats and oils: Extra virgin olive oil. Avocado oil. Grape seed oil. Sweets and desserts:  [place] yogurt with honey. Baked apples. Poached pears. Trail mix. Seasoning and other foods: Basil. Cilantro. Coriander. Cumin. Mint. Parsley. Sage. Rosemary. Tarragon. Garlic. Oregano. Thyme. Pepper. Balsalmic vinegar. Tahini. Hummus. Tomato sauce. Olives. Mushrooms. Limit these Grains: Prepackaged pasta or rice dishes. Prepackaged cereal with added sugar. Vegetables: Deep fried potatoes (french fries). Fruits: Fruit canned in syrup. Meats and other protein foods: Beef. Pork. Lamb. Poultry with skin. Hot dogs. [place]. Dairy: Ice cream. Sour cream. Whole milk. Beverages: Juice. Sugar-sweetened soft drinks. Beer. Liquor and spirits. Fats and oils: Butter. Canola oil. Vegetable oil. Beef fat (tallow). Lard. Sweets and desserts: Cookies. Cakes. Pies. Candy. Seasoning and other foods: Mayonnaise. Premade sauces and marinades. The items listed may not be a complete list. Talk with your dietitian about what dietary choices are right for you. Summary The Mediterranean diet includes both food and lifestyle choices. Eat a variety of fresh fruits and vegetables, beans, nuts, seeds, and whole grains. Limit the amount of red meat and sweets that you eat. Talk with your health care provider about whether it is safe for you to drink red wine in moderation. This means 1 glass a day for nonpregnant women and 2 glasses a day for men. A glass of wine equals 5 oz (150 mL). This information is not intended to replace advice given to you by your health care provider. Make sure you discuss any questions you have with your health care provider. Document Released: 11/04/2015 Document Revised: 12/07/2015 Document Reviewed: 11/04/2015 Elsevier Interactive Patient Education  2017 [employer].

## 2024-01-14 ENCOUNTER — Other Ambulatory Visit: Payer: Self-pay | Admitting: Family

## 2024-01-14 DIAGNOSIS — I10 Essential (primary) hypertension: Secondary | ICD-10-CM

## 2024-03-14 ENCOUNTER — Other Ambulatory Visit: Payer: Self-pay | Admitting: Family

## 2024-03-14 DIAGNOSIS — E785 Hyperlipidemia, unspecified: Secondary | ICD-10-CM

## 2024-03-17 ENCOUNTER — Encounter: Payer: Self-pay | Admitting: Family

## 2024-03-17 ENCOUNTER — Ambulatory Visit (INDEPENDENT_AMBULATORY_CARE_PROVIDER_SITE_OTHER): Admitting: Family

## 2024-03-17 VITALS — BP 127/81 | HR 93 | Temp 99.8°F | Resp 18 | Ht <= 58 in | Wt 154.2 lb

## 2024-03-17 DIAGNOSIS — R0989 Other specified symptoms and signs involving the circulatory and respiratory systems: Secondary | ICD-10-CM

## 2024-03-17 DIAGNOSIS — E785 Hyperlipidemia, unspecified: Secondary | ICD-10-CM | POA: Diagnosis not present

## 2024-03-17 DIAGNOSIS — I10 Essential (primary) hypertension: Secondary | ICD-10-CM | POA: Diagnosis not present

## 2024-03-17 MED ORDER — ATORVASTATIN CALCIUM 20 MG PO TABS: 20.0000 mg | ORAL_TABLET | Freq: Every day | ORAL | 0 refills | Status: AC

## 2024-03-17 MED ORDER — LISINOPRIL 20 MG PO TABS: 20.0000 mg | ORAL_TABLET | Freq: Every day | ORAL | 0 refills | Status: AC

## 2024-03-17 MED ORDER — AMLODIPINE BESYLATE 5 MG PO TABS: 5.0000 mg | ORAL_TABLET | Freq: Every day | ORAL | 0 refills | Status: AC

## 2024-03-17 MED ORDER — PSEUDOEPH-BROMPHEN-DM 30-2-10 MG/5ML PO SYRP: 5.0000 mL | ORAL_SOLUTION | Freq: Four times a day (QID) | ORAL | 0 refills | Status: AC | PRN

## 2024-03-17 NOTE — Progress Notes (Signed)
 Patient has been coughing and having nasal congestion for about a week

## 2024-03-17 NOTE — Progress Notes (Signed)
 "   Patient ID: April Randall, female    DOB: [birth year]  MRN: [id number]  CC: Chronic Conditions Follow-Up  Subjective: April Randall is a 73 y.o. female who presents for chronic conditions follow-up.  Her concerns today include:  - Doing well on Amlodipine  and Lisinopril , no issues/concerns. She does not complain of red flag symptoms such as but not limited to chest pain, shortness of breath, worst headache of life, nausea/vomiting.  - Doing well on Atorvastatin , no issues/concerns.  - Cough, sneezing, and stuffy nose for 1 week. Denies red flag symptoms.   Patient Active Problem List   Diagnosis Date Noted   Prediabetes 05/11/2021   History of right knee joint replacement 01/29/2020   Hyperlipidemia 08/22/2019   Arthritis    Essential hypertension    S/P left unicompartmental knee replacement 12/04/2011   Arthritis of knee 08/30/2011     Medications Ordered Prior to Encounter[1]  Allergies[2]  Social History   Socioeconomic History   Marital status: Single    Spouse name: Not on file   Number of children: 4   Years of education: Not on file   Highest education level: Not on file  Occupational History   Not on file  Tobacco Use   Smoking status: Never    Passive exposure: Never   Smokeless tobacco: Never  Vaping Use   Vaping status: Never Used  Substance and Sexual Activity   Alcohol use: No    Alcohol/week: 0.0 standard drinks of alcohol   Drug use: No   Sexual activity: Not Currently    Birth control/protection: Post-menopausal  Other Topics Concern   Not on file  Social History Narrative   Right handed   [place]anch home   Social Drivers of Health   Tobacco Use: Low Risk (03/17/2024)   Patient History    Smoking Tobacco Use: Never    Smokeless Tobacco Use: Never    Passive Exposure: Never  Financial Resource Strain: Low Risk (03/14/2022)   Overall Financial Resource Strain (CARDIA)    Difficulty of Paying Living Expenses: Not hard at all  Food  Insecurity: Low Risk (05/23/2023)   Received from [facility]   Epic    Within the past 12 months, the food you bought just didn't last and you didn't have money to get more. : Never true    Within the past 12 months, you worried that your food would run out before you got money to buy more: Never true  Transportation Needs: No Transportation Needs (05/23/2023)   Received from [facility]    In the past 12 months, has lack of reliable transportation kept you from medical appointments, meetings, work or from getting things needed for daily living? : No  Physical Activity: Inactive (03/14/2022)   Exercise Vital Sign    Days of Exercise per Week: 0 days    Minutes of Exercise per Session: 0 min  Stress: No Stress Concern Present (03/14/2022)   [employer] of Occupational [employer] - Occupational Stress Questionnaire    Feeling of Stress : Not at all  Social Connections: Socially Integrated (03/14/2022)   Social Connection and Isolation Panel    Frequency of Communication with Friends and Family: More than three times a week    Frequency of Social Gatherings with Friends and Family: More than three times a week    Attends Religious Services: More than 4 times per year    Active Member of Clubs or Organizations: Yes    Attends  Club or Organization Meetings: More than 4 times per year    Marital Status: Married  Catering Manager Violence: Not At Risk (03/14/2022)   Humiliation, Afraid, Rape, and Kick questionnaire    Fear of Current or Ex-Partner: No    Emotionally Abused: No    Physically Abused: No    Sexually Abused: No  Depression (PHQ2-9): Low Risk (03/17/2024)   Depression (PHQ2-9)    PHQ-2 Score: 0  Alcohol Screen: Low Risk (03/14/2022)   Alcohol Screen    Last Alcohol Screening Score (AUDIT): 0  Housing: Low Risk (05/23/2023)   Received from [facility]   Epic    Think about the place you live. Do you have problems with any of the following? Choose all  that apply:: None/None on this list    What is your living situation today?: I have a steady place to live  Utilities: Low Risk (05/23/2023)   Received from [facility]   Utilities    In the past 12 months has the electric, gas, oil, or water company threatened to shut off services in your home? : No  Health Literacy: Adequate Health Literacy (07/09/2023)   B1300 Health Literacy    Frequency of need for help with medical instructions: Never    Family History  Problem Relation Age of Onset   Hypertension Mother    Breast cancer Neg Hx    Colon cancer Neg Hx    Colon polyps Neg Hx    Esophageal cancer Neg Hx    Rectal cancer Neg Hx    Stomach cancer Neg Hx    BRCA 1/2 Neg Hx     Past Surgical History:  Procedure Laterality Date   COLONOSCOPY  05/29/2022   JOINT REPLACEMENT N/A    [employer] 08/20/2019   REPLACEMENT TOTAL KNEE Right     ROS: Review of Systems Negative except as stated above  PHYSICAL EXAM: BP 127/81   Pulse 93   Temp 99.8 F (37.7 C) (Oral)   Resp 18   Ht 4' 9 (1.448 m)   Wt 154 lb 3.2 oz (69.9 kg)   SpO2 96%   BMI 33.37 kg/m   Physical Exam HENT:     Head: Normocephalic and atraumatic.     Right Ear: Tympanic membrane, ear canal and external ear normal.     Left Ear: Tympanic membrane, ear canal and external ear normal.     Nose: Nose normal.     Mouth/Throat:     Mouth: Mucous membranes are moist.     Pharynx: Oropharynx is clear.  Eyes:     Extraocular Movements: Extraocular movements intact.     Conjunctiva/sclera: Conjunctivae normal.     Pupils: Pupils are equal, round, and reactive to light.  Cardiovascular:     Rate and Rhythm: Normal rate and regular rhythm.     Pulses: Normal pulses.     Heart sounds: Normal heart sounds.  Pulmonary:     Effort: Pulmonary effort is normal.     Breath sounds: Normal breath sounds.  Musculoskeletal:        General: Normal range of motion.     Cervical back: Normal range of motion and neck  supple.  Neurological:     General: No focal deficit present.     Mental Status: She is alert and oriented to person, place, and time.  Psychiatric:        Mood and Affect: Mood normal.        Behavior: Behavior normal.  ASSESSMENT AND PLAN: 1. Primary hypertension (Primary) - Continue Amlodipine  and Lisinopril  as prescribed.  - Counseled on blood pressure goal of less than 130/80, low-sodium, DASH diet, medication compliance, and 150 minutes of moderate intensity exercise per week as tolerated. Counseled on medication adherence and adverse effects. - Follow-up with primary provider in 3 months or sooner if needed.  - amLODipine  (NORVASC ) 5 MG tablet; Take 1 tablet (5 mg total) by mouth daily.  Dispense: 90 tablet; Refill: 0 - lisinopril  (ZESTRIL ) 20 MG tablet; Take 1 tablet (20 mg total) by mouth daily.  Dispense: 90 tablet; Refill: 0  2. Hyperlipidemia, unspecified hyperlipidemia type - Continue Atorvastatin  as prescribed. Counseled on medication adherence/adverse effects.  - Follow-up with primary provider in 3 months or sooner if needed.  - atorvastatin  (LIPITOR) 20 MG tablet; Take 1 tablet (20 mg total) by mouth daily.  Dispense: 90 tablet; Refill: 0  3. Upper respiratory symptom - Patient today in office with no cardiopulmonary/acute distress.  - Brompheniramine-Pseudoephedrine-DM as prescribed. Counseled on medication adherence/adverse effects.  - Routine screening.  - Follow-up with primary provider as scheduled.  - POCT rapid strep A; Future - Culture, Group A Strep - COVID-19, Flu A+B and RSV - brompheniramine-pseudoephedrine-DM 30-2-10 MG/5ML syrup; Take 5 mLs by mouth 4 (four) times daily as needed.  Dispense: 120 mL; Refill: 0   Patient was given the opportunity to ask questions.  Patient verbalized understanding of the plan and was able to repeat key elements of the plan. Patient was given clear instructions to go to Emergency Department or return to medical center  if symptoms don't improve, worsen, or new problems develop.The patient verbalized understanding.   Orders Placed This Encounter  Procedures   Culture, Group A Strep   COVID-19, Flu A+B and RSV   POCT rapid strep A     Requested Prescriptions   Signed Prescriptions Disp Refills   amLODipine  (NORVASC ) 5 MG tablet 90 tablet 0    Sig: Take 1 tablet (5 mg total) by mouth daily.   atorvastatin  (LIPITOR) 20 MG tablet 90 tablet 0    Sig: Take 1 tablet (20 mg total) by mouth daily.   lisinopril  (ZESTRIL ) 20 MG tablet 90 tablet 0    Sig: Take 1 tablet (20 mg total) by mouth daily.   brompheniramine-pseudoephedrine-DM 30-2-10 MG/5ML syrup 120 mL 0    Sig: Take 5 mLs by mouth 4 (four) times daily as needed.    Return in 3 months (on 06/15/2024) for Follow-Up or next available chronic conditions.  Greig JINNY Chute, NP      [1]  Current Outpatient Medications on File Prior to Visit  Medication Sig Dispense Refill   ASPIRIN  LOW DOSE 81 MG tablet TAKE 1 TABLET BY MOUTH EVERY DAY *SWALLOW WHOLE* 90 tablet 0   ASPIRIN  LOW DOSE 81 MG tablet TAKE 1 TABLET BY MOUTH EVERY 6 HOURS AS NEEDED. SWALLOW WHOLE. 90 tablet 0   Baclofen  5 MG TABS Take 1 tablet (5 mg total) by mouth every 12 (twelve) hours as needed. 20 tablet 0   Blood Pressure Monitor DEVI Use as directed to check home blood pressure 2-3 times a week 1 each 0   diclofenac  Sodium (VOLTAREN ) 1 % GEL Apply 2 g topically in the morning and at bedtime. 350 g 2   donepezil  (ARICEPT ) 5 MG tablet Take 1 tablet (5 mg total) by mouth daily. 90 tablet 3   fluticasone  (FLONASE ) 50 MCG/ACT nasal spray Place 1 spray into both nostrils daily.  16 g 0   loratadine  (CLARITIN ) 10 MG tablet Take 1 tablet (10 mg total) by mouth daily. 30 tablet 0   predniSONE  (DELTASONE ) 5 MG tablet 12 day taper: 6,6,5,5,4,4,3,3,2,2,1,1 (Patient not taking: Reported on 12/13/2023) 42 tablet 0   No current facility-administered medications on file prior to visit.  [2] No Known  Allergies  "

## 2024-03-19 LAB — COVID-19, FLU A+B AND RSV
Influenza A, NAA: NOT DETECTED
Influenza B, NAA: NOT DETECTED
RSV, NAA: NOT DETECTED
SARS-CoV-2, NAA: NOT DETECTED

## 2024-03-19 LAB — CULTURE, GROUP A STREP: Strep A Culture: NEGATIVE

## 2024-03-21 ENCOUNTER — Ambulatory Visit: Payer: Self-pay | Admitting: Family Medicine

## 2024-06-11 ENCOUNTER — Ambulatory Visit: Admitting: Physician Assistant

## 2024-06-17 ENCOUNTER — Ambulatory Visit: Payer: Self-pay | Admitting: Family

## 2024-06-18 ENCOUNTER — Ambulatory Visit: Admitting: Family
